# Patient Record
Sex: Male | Born: 1965
Health system: Southern US, Community
[De-identification: ages and names within clinical notes are randomized; demographics above are authoritative.]

## PROBLEM LIST (undated history)

## (undated) DIAGNOSIS — I2510 Atherosclerotic heart disease of native coronary artery without angina pectoris: Secondary | ICD-10-CM

## (undated) DIAGNOSIS — E119 Type 2 diabetes mellitus without complications: Secondary | ICD-10-CM

## (undated) DIAGNOSIS — R7989 Other specified abnormal findings of blood chemistry: Secondary | ICD-10-CM

## (undated) DIAGNOSIS — N182 Chronic kidney disease, stage 2 (mild): Secondary | ICD-10-CM

## (undated) DIAGNOSIS — G4733 Obstructive sleep apnea (adult) (pediatric): Secondary | ICD-10-CM

## (undated) DIAGNOSIS — G44009 Cluster headache syndrome, unspecified, not intractable: Secondary | ICD-10-CM

## (undated) DIAGNOSIS — M999 Biomechanical lesion, unspecified: Secondary | ICD-10-CM

## (undated) DIAGNOSIS — Z720 Tobacco use: Secondary | ICD-10-CM

## (undated) DIAGNOSIS — I10 Essential (primary) hypertension: Secondary | ICD-10-CM

## (undated) DIAGNOSIS — E785 Hyperlipidemia, unspecified: Secondary | ICD-10-CM

## (undated) DIAGNOSIS — M436 Torticollis: Secondary | ICD-10-CM

## (undated) DIAGNOSIS — I4891 Unspecified atrial fibrillation: Secondary | ICD-10-CM

## (undated) HISTORY — DX: Torticollis: M43.6

## (undated) HISTORY — DX: Other specified abnormal findings of blood chemistry: R79.89

## (undated) HISTORY — PX: TONSILLECTOMY: SUR1361

## (undated) HISTORY — PX: OTHER SURGICAL HISTORY: SHX169

## (undated) HISTORY — DX: Atherosclerotic heart disease of native coronary artery without angina pectoris: I25.10

## (undated) HISTORY — DX: Biomechanical lesion, unspecified: M99.9

---

## 2010-10-20 ENCOUNTER — Ambulatory Visit (HOSPITAL_COMMUNITY)
Admission: RE | Admit: 2010-10-20 | Discharge: 2010-10-20 | Disposition: A | Discharge status: Home / Self Care | Payer: Managed Care, Other (non HMO) | Source: Ambulatory Visit | Attending: Urology | Admitting: Urology

## 2010-10-20 ENCOUNTER — Ambulatory Visit (HOSPITAL_COMMUNITY): Payer: Managed Care, Other (non HMO)

## 2010-10-20 DIAGNOSIS — F17200 Nicotine dependence, unspecified, uncomplicated: Secondary | ICD-10-CM | POA: Insufficient documentation

## 2010-10-20 DIAGNOSIS — N200 Calculus of kidney: Secondary | ICD-10-CM | POA: Insufficient documentation

## 2010-10-20 LAB — CBC
MCH: 30.5 pg (ref 26.0–34.0)
MCHC: 34.6 g/dL (ref 30.0–36.0)
Platelets: 264 10*3/uL (ref 150–400)
RDW: 12.3 % (ref 11.5–15.5)

## 2010-10-20 LAB — PROTIME-INR
INR: 0.93 (ref 0.00–1.49)
Prothrombin Time: 12.7 seconds (ref 11.6–15.2)

## 2012-08-12 ENCOUNTER — Other Ambulatory Visit: Payer: Self-pay | Admitting: *Deleted

## 2012-08-16 ENCOUNTER — Ambulatory Visit (HOSPITAL_COMMUNITY)
Admission: RE | Admit: 2012-08-16 | Discharge: 2012-08-16 | Disposition: A | Discharge status: Home / Self Care | Payer: Managed Care, Other (non HMO) | Source: Ambulatory Visit | Attending: Cardiovascular Disease | Admitting: Cardiovascular Disease

## 2012-08-16 ENCOUNTER — Encounter (HOSPITAL_COMMUNITY)
Admission: RE | Disposition: A | Discharge status: Home / Self Care | Payer: Self-pay | Source: Ambulatory Visit | Attending: Cardiovascular Disease

## 2012-08-16 ENCOUNTER — Encounter (HOSPITAL_COMMUNITY): Payer: Self-pay | Admitting: Physician Assistant

## 2012-08-16 DIAGNOSIS — F17200 Nicotine dependence, unspecified, uncomplicated: Secondary | ICD-10-CM | POA: Insufficient documentation

## 2012-08-16 DIAGNOSIS — E119 Type 2 diabetes mellitus without complications: Secondary | ICD-10-CM | POA: Insufficient documentation

## 2012-08-16 DIAGNOSIS — Z720 Tobacco use: Secondary | ICD-10-CM | POA: Insufficient documentation

## 2012-08-16 DIAGNOSIS — R079 Chest pain, unspecified: Secondary | ICD-10-CM | POA: Insufficient documentation

## 2012-08-16 DIAGNOSIS — E1169 Type 2 diabetes mellitus with other specified complication: Secondary | ICD-10-CM | POA: Insufficient documentation

## 2012-08-16 DIAGNOSIS — I2510 Atherosclerotic heart disease of native coronary artery without angina pectoris: Secondary | ICD-10-CM | POA: Insufficient documentation

## 2012-08-16 DIAGNOSIS — E785 Hyperlipidemia, unspecified: Secondary | ICD-10-CM | POA: Insufficient documentation

## 2012-08-16 DIAGNOSIS — G4733 Obstructive sleep apnea (adult) (pediatric): Secondary | ICD-10-CM | POA: Insufficient documentation

## 2012-08-16 DIAGNOSIS — E7849 Other hyperlipidemia: Secondary | ICD-10-CM | POA: Insufficient documentation

## 2012-08-16 DIAGNOSIS — I129 Hypertensive chronic kidney disease with stage 1 through stage 4 chronic kidney disease, or unspecified chronic kidney disease: Secondary | ICD-10-CM | POA: Insufficient documentation

## 2012-08-16 DIAGNOSIS — N182 Chronic kidney disease, stage 2 (mild): Secondary | ICD-10-CM | POA: Insufficient documentation

## 2012-08-16 HISTORY — DX: Obstructive sleep apnea (adult) (pediatric): G47.33

## 2012-08-16 HISTORY — DX: Tobacco use: Z72.0

## 2012-08-16 HISTORY — DX: Essential (primary) hypertension: I10

## 2012-08-16 HISTORY — PX: LEFT HEART CATHETERIZATION WITH CORONARY ANGIOGRAM: SHX5451

## 2012-08-16 HISTORY — DX: Chronic kidney disease, stage 2 (mild): N18.2

## 2012-08-16 HISTORY — DX: Type 2 diabetes mellitus without complications: E11.9

## 2012-08-16 HISTORY — DX: Cluster headache syndrome, unspecified, not intractable: G44.009

## 2012-08-16 HISTORY — DX: Hyperlipidemia, unspecified: E78.5

## 2012-08-16 LAB — GLUCOSE, CAPILLARY: Glucose-Capillary: 84 mg/dL (ref 70–99)

## 2012-08-16 SURGERY — LEFT HEART CATHETERIZATION WITH CORONARY ANGIOGRAM
Anesthesia: LOCAL

## 2012-08-16 MED ORDER — ACETAMINOPHEN 325 MG PO TABS: 650.0000 mg | ORAL_TABLET | ORAL | Status: DC | PRN

## 2012-08-16 MED ORDER — ADENOSINE 12 MG/4ML IV SOLN
16.0000 mL | Freq: Once | INTRAVENOUS | Status: DC
  Filled 2012-08-16: qty 16

## 2012-08-16 MED ORDER — VERAPAMIL HCL 2.5 MG/ML IV SOLN
INTRAVENOUS | Status: AC
  Filled 2012-08-16: qty 2

## 2012-08-16 MED ORDER — MIDAZOLAM HCL 2 MG/2ML IJ SOLN
INTRAMUSCULAR | Status: AC
  Filled 2012-08-16: qty 2

## 2012-08-16 MED ORDER — FENTANYL CITRATE 0.05 MG/ML IJ SOLN
INTRAMUSCULAR | Status: AC
  Filled 2012-08-16: qty 2

## 2012-08-16 MED ORDER — SODIUM CHLORIDE 0.9 % IV SOLN
INTRAVENOUS | Status: DC
  Administered 2012-08-16: 13:00:00 via INTRAVENOUS

## 2012-08-16 MED ORDER — DIAZEPAM 5 MG PO TABS
ORAL_TABLET | ORAL | Status: AC
  Filled 2012-08-16: qty 1

## 2012-08-16 MED ORDER — HEPARIN (PORCINE) IN NACL 2-0.9 UNIT/ML-% IJ SOLN
INTRAMUSCULAR | Status: AC
  Filled 2012-08-16: qty 1000

## 2012-08-16 MED ORDER — NITROGLYCERIN 1 MG/10 ML FOR IR/CATH LAB
INTRA_ARTERIAL | Status: AC
  Filled 2012-08-16: qty 10

## 2012-08-16 MED ORDER — DIAZEPAM 5 MG PO TABS
5.0000 mg | ORAL_TABLET | ORAL | Status: AC
  Administered 2012-08-16: 5 mg via ORAL

## 2012-08-16 MED ORDER — MORPHINE SULFATE 2 MG/ML IJ SOLN: 2.0000 mg | INTRAMUSCULAR | Status: DC | PRN

## 2012-08-16 MED ORDER — SODIUM CHLORIDE 0.9 % IJ SOLN: 3.0000 mL | INTRAMUSCULAR | Status: DC | PRN

## 2012-08-16 MED ORDER — SODIUM CHLORIDE 0.9 % IV SOLN
INTRAVENOUS | Status: AC
  Administered 2012-08-16: 18:00:00 via INTRAVENOUS

## 2012-08-16 MED ORDER — BIVALIRUDIN 250 MG IV SOLR
INTRAVENOUS | Status: AC
  Filled 2012-08-16: qty 250

## 2012-08-16 MED ORDER — LIDOCAINE HCL (PF) 1 % IJ SOLN
INTRAMUSCULAR | Status: AC
  Filled 2012-08-16: qty 30

## 2012-08-16 MED ORDER — HEPARIN SODIUM (PORCINE) 1000 UNIT/ML IJ SOLN
INTRAMUSCULAR | Status: AC
  Filled 2012-08-16: qty 1

## 2012-08-16 MED ORDER — ONDANSETRON HCL 4 MG/2ML IJ SOLN: 4.0000 mg | Freq: Four times a day (QID) | INTRAMUSCULAR | Status: DC | PRN

## 2012-08-16 NOTE — H&P (Signed)
    Pt was reexamined and existing H & P reviewed. No changes found.  Runell Gess, MD [facility] 08/16/2012 3:40 PM

## 2012-08-16 NOTE — H&P (Signed)
Matthew Horton is an 47 y.o. male.   Chief Complaint:  Abnormal Stress test HPI:   The patient is a 47 yo male, who drives a truck for a living, with a history of DM2, continued tobacco abuse, HTN, HLD, OSA, CKD.  He has been having trouble with chest tightness and had a stress test which was abnormal.  He has never had a sleep study and is not interested in having one because it might affect his job.  He has lost about 15 ponds in the last two weeks due to dietary changes.  He denies N, V, fever, cough, congestion, CP, SOB beyond his usual baseline, orthopnea, LEE, Abd pain, hematuria, hematochezia.  Medications:  ASA 81mg , NTG SL 0.4mg , Lopressor 25mg  BID  Past Medical History  Diagnosis Date  . HTN (hypertension)   . DM2 (diabetes mellitus, type 2)   . OSA (obstructive sleep apnea)   . CKD (chronic kidney disease) stage 2, GFR 60-89 ml/min   . HLD (hyperlipidemia)   . Tobacco abuse   . Cluster headache     No past surgical history on file.  No family history on file. Social History:  reports that he has been smoking Cigarettes.  He has a 30 pack-year smoking history. He does not have any smokeless tobacco history on file. He reports that he does not drink alcohol. His drug history is not on file.  Allergies: No Known Allergies  Medications Prior to Admission  Medication Sig Dispense Refill  . aspirin EC 81 MG tablet Take 81 mg by mouth daily.      . metFORMIN (GLUCOPHAGE) 500 MG tablet Take 500 mg by mouth daily.      . metoprolol (LOPRESSOR) 50 MG tablet Take 50 mg by mouth 2 (two) times daily.      . simvastatin (ZOCOR) 20 MG tablet Take 20 mg by mouth every evening.        Results for orders placed during the hospital encounter of 08/16/12 (from the past 48 hour(s))  GLUCOSE, CAPILLARY     Status: None   Collection Time    08/16/12  1:25 PM      Result Value Range   Glucose-Capillary 75  70 - 99 mg/dL   No results found.  Review of Systems  Constitutional: Negative  for fever and diaphoresis.  HENT: Negative for sore throat.   Respiratory: Negative for cough, shortness of breath and wheezing.   Cardiovascular: Negative for chest pain, orthopnea, leg swelling and PND.  Gastrointestinal: Negative for nausea, vomiting, abdominal pain, diarrhea, constipation, blood in stool and melena.  Genitourinary: Negative for dysuria and hematuria.  Musculoskeletal: Negative for myalgias.  Neurological: Negative for dizziness.    Blood pressure 141/89, pulse 66, temperature 97 F (36.1 C), temperature source Oral, resp. rate 18, height 5\' 10"  (1.778 m), weight 97.523 kg (215 lb), SpO2 100.00%. Physical Exam  Constitutional: He is oriented to person, place, and time. He appears well-developed and well-nourished. No distress.  HENT:  Head: Normocephalic and atraumatic.  Eyes: EOM are normal. Pupils are equal, round, and reactive to light. No scleral icterus.  Neck: Normal range of motion. Neck supple. No JVD present.  Cardiovascular: Normal rate, regular rhythm, S1 normal and S2 normal.   No murmur heard. Pulses:      Radial pulses are 2+ on the right side, and 2+ on the left side.       Dorsalis pedis pulses are 2+ on the right side, and 2+ on  the left side.  No Carotid Bruit.   Respiratory: Effort normal and breath sounds normal. He has no wheezes. He has no rales.  GI: Soft. He exhibits no distension. There is no tenderness.  Musculoskeletal: He exhibits no edema.  Lymphadenopathy:    He has no cervical adenopathy.  Neurological: He is alert and oriented to person, place, and time. He exhibits normal muscle tone.  Skin: Skin is warm and dry.  Psychiatric: He has a normal mood and affect.     Assessment/Plan Patient Active Problem List  Diagnosis  . Tobacco abuse  . HTN (hypertension)  . DM2 (diabetes mellitus, type 2)  . HLD (hyperlipidemia)  . Abnormal stress test  . CKD (chronic kidney disease) stage 2, GFR 60-89 ml/min   Plan:  Left heart cath  with possible PCI with Dr. Allyson Sabal.  HAGER, BRYAN 08/16/2012, 2:24 PM    Agree with note written by Jones Skene PAC  + CRF, CP and + MPI for cardiac cath radially.  Runell Gess 08/16/2012 3:39 PM

## 2012-08-16 NOTE — CV Procedure (Signed)
Matthew Horton is a 47 y.o. male    [id number] LOCATION:  FACILITY: [facility]  PHYSICIAN: Nanetta Batty, M.D. 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 He has been having trouble with chest tightness and had a stress test which was abnormal. He has never had a sleep study and is not interested in having one because it might affect his job. He has lost about 15 ponds in the last two weeks due to dietary changes. He denies N, V, fever, cough, congestion, CP, SOB beyond his usual baseline, orthopnea, LEE, Abd pain, hematuria, hematochezia.    PROCEDURE DESCRIPTION:    The patient was brought to the second floor [place] [facility] cath lab in the postabsorptive state. He was  premedicated with Valium 5 mg by mouth, IV Versed and fentanyl.Marland Kitchen His right wristwas prepped and shaved in usual sterile fashion. Xylocaine 1% was used for local anesthesia. A I French sheath was inserted into the right radial artery using standard Seldinger technique. The patient received 5000 units  of heparin  intravenously.  A 5 [place] TIG catheter and 5 [place] JR 4 catheter along with a pigtail catheter were used for selective coronary angiography left ventriculography respectively. The patient does used for the entirety of the case. Retrograde aortic, left ventricular and pullback pressures were recorded.    HEMODYNAMICS:    AO SYSTOLIC/AO DIASTOLIC: 135/85   LV SYSTOLIC/LV DIASTOLIC: 131/18  ANGIOGRAPHIC RESULTS:   1. Left main; normal  2. LAD; 30-40% segmental proximal just distal to the takeoff of a moderately large diagonal branch. The diagonal branch had a 70% smooth proximal stenosis. 3. Left circumflex; codominant and free of significant disease. The  first marginal branch was small and had a 50% ostial stenosis.Marland Kitchen  4. Right coronary artery; codominant with a 70% smooth stenosis in the midportion. 5. Left ventriculography; RAO left ventriculogram was performed using  25 mL of Visipaque dye at 12 mL/second. The overall LVEF estimated  60 % Without wall motion abnormalities  IMPRESSION:Mr. Durkee has moderate two-vessel disease in a diagonal branch and RCA. We will proceed with FFR interrogation to determine physiologic split significance.  Procedure description: The 5 French sheath was upgraded to a 6 [place] sheath. The patient received Angiomax bolus and ACT documented 562. Total of 135 cc was administered to the patient. Using a 6 [place] JR 4 guide catheter FFR was obtained of the mid RCA of 0.86 suggesting this was not a physiologically significant lesion. Using a 6 [place] XB LAD 3.5 guide catheter FFR was obtained on the diagonal branch at 0.91 likewise suggesting this was not physiologically significant.  Final impression: Moderate two-vessel disease with negative FFR determination in both vessels suggesting each was not physiologically significant. Continue medical therapy will be recommended. The guidewire and catheter was removed. A TR band was placed on the right wrist to achieve patent hemostasis. The patient left the Cath Lab in stable condition. He'll be gently hydrated for 2 hours and then discharged home. Arnette Felts [facility] was notified of these results.    Runell Gess MD, [facility] 08/16/2012 5:12 PM

## 2012-08-17 MED FILL — Dextrose Inj 5%: INTRAVENOUS | Qty: 50 | Status: AC

## 2013-03-21 ENCOUNTER — Inpatient Hospital Stay (HOSPITAL_COMMUNITY)
Admission: EM | Admit: 2013-03-21 | Discharge: 2013-03-23 | DRG: 247 | Disposition: A | Discharge status: Home / Self Care | Payer: Managed Care, Other (non HMO) | Attending: Internal Medicine | Admitting: Internal Medicine

## 2013-03-21 ENCOUNTER — Emergency Department (HOSPITAL_COMMUNITY): Payer: Managed Care, Other (non HMO)

## 2013-03-21 ENCOUNTER — Encounter (HOSPITAL_COMMUNITY): Payer: Self-pay | Admitting: Nurse Practitioner

## 2013-03-21 DIAGNOSIS — R9439 Abnormal result of other cardiovascular function study: Secondary | ICD-10-CM

## 2013-03-21 DIAGNOSIS — I2510 Atherosclerotic heart disease of native coronary artery without angina pectoris: Secondary | ICD-10-CM | POA: Insufficient documentation

## 2013-03-21 DIAGNOSIS — E785 Hyperlipidemia, unspecified: Secondary | ICD-10-CM | POA: Diagnosis present

## 2013-03-21 DIAGNOSIS — F17200 Nicotine dependence, unspecified, uncomplicated: Secondary | ICD-10-CM | POA: Diagnosis present

## 2013-03-21 DIAGNOSIS — R079 Chest pain, unspecified: Secondary | ICD-10-CM | POA: Diagnosis present

## 2013-03-21 DIAGNOSIS — I10 Essential (primary) hypertension: Secondary | ICD-10-CM

## 2013-03-21 DIAGNOSIS — E119 Type 2 diabetes mellitus without complications: Secondary | ICD-10-CM | POA: Diagnosis present

## 2013-03-21 DIAGNOSIS — I200 Unstable angina: Secondary | ICD-10-CM | POA: Diagnosis present

## 2013-03-21 DIAGNOSIS — Z955 Presence of coronary angioplasty implant and graft: Secondary | ICD-10-CM

## 2013-03-21 DIAGNOSIS — I209 Angina pectoris, unspecified: Secondary | ICD-10-CM | POA: Diagnosis present

## 2013-03-21 DIAGNOSIS — G44009 Cluster headache syndrome, unspecified, not intractable: Secondary | ICD-10-CM | POA: Diagnosis present

## 2013-03-21 DIAGNOSIS — I129 Hypertensive chronic kidney disease with stage 1 through stage 4 chronic kidney disease, or unspecified chronic kidney disease: Secondary | ICD-10-CM | POA: Diagnosis present

## 2013-03-21 DIAGNOSIS — N182 Chronic kidney disease, stage 2 (mild): Secondary | ICD-10-CM | POA: Diagnosis present

## 2013-03-21 DIAGNOSIS — Z720 Tobacco use: Secondary | ICD-10-CM

## 2013-03-21 DIAGNOSIS — Z79899 Other long term (current) drug therapy: Secondary | ICD-10-CM

## 2013-03-21 DIAGNOSIS — Z7982 Long term (current) use of aspirin: Secondary | ICD-10-CM

## 2013-03-21 DIAGNOSIS — G4733 Obstructive sleep apnea (adult) (pediatric): Secondary | ICD-10-CM | POA: Diagnosis present

## 2013-03-21 DIAGNOSIS — I4891 Unspecified atrial fibrillation: Secondary | ICD-10-CM | POA: Diagnosis not present

## 2013-03-21 LAB — GLUCOSE, CAPILLARY: Glucose-Capillary: 89 mg/dL (ref 70–99)

## 2013-03-21 LAB — POCT I-STAT TROPONIN I
Troponin i, poc: 0 ng/mL (ref 0.00–0.08)
Troponin i, poc: 0 ng/mL (ref 0.00–0.08)

## 2013-03-21 LAB — PRO B NATRIURETIC PEPTIDE: Pro B Natriuretic peptide (BNP): 32.5 pg/mL (ref 0–125)

## 2013-03-21 LAB — MRSA PCR SCREENING: MRSA by PCR: NEGATIVE

## 2013-03-21 LAB — CBC
HCT: 40.8 % (ref 39.0–52.0)
Hemoglobin: 13.8 g/dL (ref 13.0–17.0)
MCH: 29.9 pg (ref 26.0–34.0)
MCHC: 33.8 g/dL (ref 30.0–36.0)
MCV: 88.3 fL (ref 78.0–100.0)

## 2013-03-21 LAB — BASIC METABOLIC PANEL
BUN: 13 mg/dL (ref 6–23)
GFR calc non Af Amer: 86 mL/min — ABNORMAL LOW (ref >90)
Glucose, Bld: 155 mg/dL — ABNORMAL HIGH (ref 70–99)
Potassium: 3.8 mEq/L (ref 3.5–5.1)

## 2013-03-21 LAB — TROPONIN I
Troponin I: <0.3 ng/mL (ref <0.30)
Troponin I: <0.3 ng/mL (ref <0.30)

## 2013-03-21 LAB — HEPARIN LEVEL (UNFRACTIONATED): Heparin Unfractionated: 0.27 IU/mL — ABNORMAL LOW (ref 0.30–0.70)

## 2013-03-21 MED ORDER — ASPIRIN 81 MG PO CHEW
324.0000 mg | CHEWABLE_TABLET | Freq: Once | ORAL | Status: AC
  Administered 2013-03-21: 324 mg via ORAL
  Filled 2013-03-21: qty 4

## 2013-03-21 MED ORDER — SODIUM CHLORIDE 0.9 % IV SOLN
INTRAVENOUS | Status: DC
  Administered 2013-03-21 – 2013-03-22 (×2): via INTRAVENOUS

## 2013-03-21 MED ORDER — ONDANSETRON HCL 4 MG/2ML IJ SOLN: 4.0000 mg | Freq: Four times a day (QID) | INTRAMUSCULAR | Status: DC | PRN

## 2013-03-21 MED ORDER — METOPROLOL TARTRATE 50 MG PO TABS
50.0000 mg | ORAL_TABLET | Freq: Two times a day (BID) | ORAL | Status: DC
  Administered 2013-03-21 – 2013-03-23 (×4): 50 mg via ORAL
  Filled 2013-03-21 (×5): qty 1

## 2013-03-21 MED ORDER — NITROGLYCERIN 2 % TD OINT
1.0000 [in_us] | TOPICAL_OINTMENT | Freq: Once | TRANSDERMAL | Status: AC
  Administered 2013-03-21: 1 [in_us] via TOPICAL
  Filled 2013-03-21: qty 1

## 2013-03-21 MED ORDER — SODIUM CHLORIDE 0.9 % IJ SOLN
3.0000 mL | Freq: Two times a day (BID) | INTRAMUSCULAR | Status: DC
  Administered 2013-03-21: 3 mL via INTRAVENOUS
  Administered 2013-03-22: 08:00:00 via INTRAVENOUS

## 2013-03-21 MED ORDER — ACETAMINOPHEN 325 MG PO TABS
650.0000 mg | ORAL_TABLET | ORAL | Status: DC | PRN
  Administered 2013-03-21 – 2013-03-22 (×5): 650 mg via ORAL
  Filled 2013-03-21 (×5): qty 2

## 2013-03-21 MED ORDER — SODIUM CHLORIDE 0.9 % IJ SOLN: 3.0000 mL | INTRAMUSCULAR | Status: DC | PRN

## 2013-03-21 MED ORDER — ZOLPIDEM TARTRATE 5 MG PO TABS: 5.0000 mg | ORAL_TABLET | Freq: Every evening | ORAL | Status: DC | PRN

## 2013-03-21 MED ORDER — HEPARIN BOLUS VIA INFUSION
4000.0000 [IU] | Freq: Once | INTRAVENOUS | Status: AC
  Administered 2013-03-21: 4000 [IU] via INTRAVENOUS
  Filled 2013-03-21: qty 4000

## 2013-03-21 MED ORDER — HEPARIN (PORCINE) IN NACL 100-0.45 UNIT/ML-% IJ SOLN
1350.0000 [IU]/h | INTRAMUSCULAR | Status: DC
  Administered 2013-03-21: 1150 [IU]/h via INTRAVENOUS
  Filled 2013-03-21 (×3): qty 250

## 2013-03-21 MED ORDER — SODIUM CHLORIDE 0.9 % IV SOLN
1000.0000 mL | INTRAVENOUS | Status: DC
  Administered 2013-03-21: 1000 mL via INTRAVENOUS

## 2013-03-21 MED ORDER — ALPRAZOLAM 0.25 MG PO TABS
0.2500 mg | ORAL_TABLET | Freq: Two times a day (BID) | ORAL | Status: DC | PRN
  Administered 2013-03-21 – 2013-03-22 (×2): 0.25 mg via ORAL
  Filled 2013-03-21 (×2): qty 1

## 2013-03-21 MED ORDER — NITROGLYCERIN 0.4 MG SL SUBL: 0.4000 mg | SUBLINGUAL_TABLET | SUBLINGUAL | Status: DC | PRN

## 2013-03-21 MED ORDER — ASPIRIN EC 81 MG PO TBEC
81.0000 mg | DELAYED_RELEASE_TABLET | Freq: Every morning | ORAL | Status: DC
  Administered 2013-03-23: 10:00:00 81 mg via ORAL
  Filled 2013-03-21 (×2): qty 1

## 2013-03-21 MED ORDER — SIMVASTATIN 20 MG PO TABS
20.0000 mg | ORAL_TABLET | Freq: Every evening | ORAL | Status: DC
  Administered 2013-03-21 – 2013-03-22 (×2): 20 mg via ORAL
  Filled 2013-03-21 (×3): qty 1

## 2013-03-21 MED ORDER — SODIUM CHLORIDE 0.9 % IV SOLN
250.0000 mL | INTRAVENOUS | Status: DC | PRN
  Administered 2013-03-21: 18:00:00 via INTRAVENOUS

## 2013-03-21 NOTE — ED Notes (Signed)
Attempted IV x2. Unable to obtain access.  

## 2013-03-21 NOTE — Progress Notes (Signed)
ANTICOAGULATION CONSULT NOTE - Initial Consult  Pharmacy Consult for heparin Indication: chest pain/ACS  No Known Allergies  Patient Measurements: Height: 5\' 10"  (177.8 cm) Weight: 220 lb 12.8 oz (100.154 kg) IBW/kg (Calculated) : 73 Heparin Dosing Weight: 82kg  Vital Signs: Temp: 98.3 F (36.8 C) (10/07 1150) Temp src: Oral (10/07 1150) BP: 134/85 mmHg (10/07 1500) Pulse Rate: 73 (10/07 1500)  Labs:  Recent Labs  03/21/13 1230  HGB 13.8  HCT 40.8  PLT 237  APTT 26  LABPROT 13.0  INR 1.00  CREATININE 1.02    Estimated Creatinine Clearance: 106.2 ml/min (by C-G formula based on Cr of 1.02).   Medical History: Past Medical History  Diagnosis Date  . HTN (hypertension)   . DM2 (diabetes mellitus, type 2)   . CKD (chronic kidney disease) stage 2, GFR 60-89 ml/min   . HLD (hyperlipidemia)   . Tobacco abuse   . Cluster headache     Assessment: 47 year old male with known CAD present to [facility] with recurrent chest pain. Patient had a previous cath earlier this year revealing 2 vessel cad. Orders received to start IV heparin and  Plan on cath in am. Baseline cbc/coags within normal limits.  Goal of Therapy:  Heparin level 0.3-0.7 units/ml Monitor platelets by anticoagulation protocol: Yes   Plan:  Give 4000 units bolus x 1 Start heparin infusion at 1150 units/hr Check anti-Xa level in 6 hours and daily while on heparin Continue to monitor H&H and platelets  Sheppard Coil PharmD., BCPS Clinical Pharmacist Pager [phone] 03/21/2013 4:18 PM

## 2013-03-21 NOTE — ED Notes (Signed)
Pt reports chronic CP but today it was worse than normal after he woke up. CP persisted and then when he got to work he began to feel "like my legs werent there, i felt like I couldn't walk" and felt dizzy. at that time he decided to come to er for eval. Pt took 1 SL nitro after CP started "which helped ease the pain but now its coming back." wife states pt has seened more SOB recenlty

## 2013-03-21 NOTE — H&P (Signed)
CC: Chest Pain Referring Physician: ER MD Primary Cardiologist: Dr. 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 He underwent a diagnostic LHC, by Dr. Allyson Sabal, on 08/16/12 in the setting of chest pain and an abnormal NST. The cath revealed moderate two-vessel disease in a diagonal branch and RCA. The first marginal branch was small and had a 50% ostial stenosis. The  right coronary artery was codominant with a 70% smooth stenosis in the midportion. The overall LVEF was estimated at 60 % without wall motion abnormalities. Negative FFR determination in both vessels suggested each was not physiologically significant. Medical therapy was recommended. He was sent home the very same day on ASA, Lopressor and Simvastatin.    He returned to the [facility] ER today with a complaint of substernal chest pain. The pain first began at rest around 1:30 am. It is a pressure like pain. It feels like a "heavy brick" is sitting on his chest. He denies radiation. He has had associated SOB fatigue/genralized malaise. No n/v or diaphoresis. He has felt a bit lightheaded. He took a SL NTG earlier this am and the pain improved a moderate amount, but then returned. He is currently CP free in the ER with NTG patch.   The patient states that he has continued to smoke 1 1/2 ppd. He reports compliance with his prescribed home mediations. He endorses a strong family history of CAD. His father had an MI in his early 53's and is s/p CABG.   Past Medical History  Diagnosis Date  . HTN (hypertension)   . DM2 (diabetes mellitus, type 2)   . CKD (chronic kidney disease) stage 2, GFR 60-89 ml/min   . HLD (hyperlipidemia)   . Tobacco abuse   . Cluster headache     History reviewed. No pertinent past surgical history.  Family History  Problem Relation Age of Onset  . Coronary artery disease Father 66      MI and s/p CABG    Social History:  reports that he has been smoking Cigarettes.  He has a 30 pack-year smoking history. He does not have any smokeless tobacco history on file. He reports that he does not drink alcohol or use illicit drugs.  Allergies: No Known Allergies  Medications:  Prior to Admission medications   Medication Sig Start Date End Date Taking? Authorizing Provider  aspirin EC 81 MG tablet Take 81 mg by mouth every morning.    Yes Historical Provider, MD  metFORMIN (GLUCOPHAGE) 500 MG tablet Take 500 mg by mouth every evening.    Yes Historical Provider, MD  metoprolol (LOPRESSOR) 50 MG tablet Take 50 mg by mouth 2 (two) times daily.   Yes Historical Provider, MD  simvastatin (ZOCOR) 20 MG tablet Take 20 mg by mouth every evening.   Yes Historical Provider, MD     Results for orders placed during the hospital encounter of 03/21/13 (from the past 48 hour(s))  BASIC METABOLIC PANEL     Status: Abnormal   Collection Time    03/21/13 12:30 PM      Result Value Range   Sodium 138  135 - 145 mEq/L   Potassium 3.8  3.5 - 5.1 mEq/L   Chloride 100  96 - 112 mEq/L   CO2 27  19 - 32 mEq/L   Glucose, Bld 155 (*)  70 - 99 mg/dL   BUN 13  6 - 23 mg/dL   Creatinine, Ser 1.61  0.50 - 1.35 mg/dL   Calcium 9.3  8.4 - 09.6 mg/dL   GFR calc non Af Amer 86 (*) >90 mL/min   GFR calc Af Amer >90  >90 mL/min   Comment: (NOTE)     The eGFR has been calculated using the CKD EPI equation.     This calculation has not been validated in all clinical situations.     eGFR's persistently <90 mL/min signify possible Chronic Kidney     Disease.  CBC     Status: None   Collection Time    03/21/13 12:30 PM      Result Value Range   WBC 6.8  4.0 - 10.5 K/uL   RBC 4.62  4.22 - 5.81 MIL/uL   Hemoglobin 13.8  13.0 - 17.0 g/dL   HCT 04.5  40.9 - 81.1 %   MCV 88.3  78.0 - 100.0 fL   MCH 29.9  26.0 - 34.0 pg   MCHC 33.8  30.0 - 36.0 g/dL   RDW 91.4  78.2 - 95.6 %   Platelets 237  150 - 400  K/uL  PRO B NATRIURETIC PEPTIDE     Status: None   Collection Time    03/21/13 12:30 PM      Result Value Range   Pro B Natriuretic peptide (BNP) 32.5  0 - 125 pg/mL  PROTIME-INR     Status: None   Collection Time    03/21/13 12:30 PM      Result Value Range   Prothrombin Time 13.0  11.6 - 15.2 seconds   INR 1.00  0.00 - 1.49  APTT     Status: None   Collection Time    03/21/13 12:30 PM      Result Value Range   aPTT 26  24 - 37 seconds  POCT I-STAT TROPONIN I     Status: None   Collection Time    03/21/13 12:40 PM      Result Value Range   Troponin i, poc 0.00  0.00 - 0.08 ng/mL   Comment 3            Comment: Due to the release kinetics of cTnI,     a negative result within the first hours     of the onset of symptoms does not rule out     myocardial infarction with certainty.     If myocardial infarction is still suspected,     repeat the test at appropriate intervals.    Dg Chest 2 View  03/21/2013   CLINICAL DATA:  Chest pain, dizziness, shortness of breath  EXAM: CHEST  2 VIEW  COMPARISON:  None.  FINDINGS: The heart size and mediastinal contours are within normal limits. No acute infiltrate or pulmonary edema. Mild degenerative changes lower thoracic spine.  IMPRESSION: No active cardiopulmonary disease.   Electronically Signed   By: Natasha Mead M.D.   On: 03/21/2013 12:13    Review of Systems  Constitutional: Positive for malaise/fatigue. Negative for diaphoresis.  Respiratory: Positive for shortness of breath.   Cardiovascular: Positive for chest pain. Negative for leg swelling.  Gastrointestinal: Negative for nausea and vomiting.  Neurological: Negative for dizziness and loss of consciousness.  All other systems reviewed and are negative.   Blood pressure 139/88, pulse 72, temperature 98.3 F (36.8 C), temperature source Oral, resp. rate 17, height 5\' 10"  (1.778  m), weight 220 lb 12.8 oz (100.154 kg), SpO2 98.00%. Physical Exam  Constitutional: He is oriented  to person, place, and time. He appears well-developed and well-nourished. No distress.  Neck: No JVD present. Carotid bruit is not present.  Cardiovascular: Normal rate, regular rhythm, normal heart sounds and intact distal pulses.  Exam reveals no gallop and no friction rub.   No murmur heard. Pulses:      Radial pulses are 2+ on the right side, and 2+ on the left side.       Dorsalis pedis pulses are 2+ on the right side, and 2+ on the left side.  Respiratory: Effort normal and breath sounds normal. No respiratory distress. He has no wheezes. He has no rales.  GI: Soft. Bowel sounds are normal. He exhibits no distension and no mass. There is no tenderness.  Musculoskeletal: He exhibits no edema.  Neurological: He is alert and oriented to person, place, and time.  Skin: Skin is warm and dry. He is not diaphoretic.  Psychiatric: He has a normal mood and affect. His behavior is normal.    Assessment/Plan: Principal Problem:   Unstable angina Active Problems:   Tobacco abuse   HTN (hypertension)   DM2 (diabetes mellitus, type 2)   HLD (hyperlipidemia)   CKD (chronic kidney disease) stage 2, GFR 60-89 ml/min   CAD (coronary artery disease)  Plan: This is a 47 y/o male with known CAD and multiple cardiac risk factors, including continued tobacco abuse (1.5 ppd), strong family history, HTN, DM and HLD, who presents with symptoms concerning for unstable angina. He was cathed by Dr Allyson Sabal 7 months ago.  There was moderate two-vessel disease with negative FFR determination in both vessels (left circumflex and RCA), suggesting each was not physiologically significant. The overall LVEF was estimated at 60 % without wall motion abnormalities. Continued medical therapy was recommended. He has been on ASA, Lopressor and a statin. He is currently CP free with nitro patch. His EKG shows no acute changes. POC troponin is negative and CXR is unremarkable, as is CBC and BMP. Given his symptoms, known CAD and  multiple cardiac risk factors, it is best to admit for observation and to rule out MI.  Will admit and will cycle troponins x 3. He will also benefit from re-look cath to look for progression of disease given his continued tobacco abuse. Will make NPO at midnight and will place precath orders. Will hold Metformin in anticipation of cath.  HR and BP both stable. Continue on ASA, BB and statin. Will initiate IV heparin per pharmacy. Will continue to stress the importance of smoking cessation.   MD to follow.    Allayne Butcher, PA-C 03/21/2013, 3:25 PM

## 2013-03-21 NOTE — ED Provider Notes (Signed)
CSN: [id number]     Arrival date & time 03/21/13  1139 History  First MD Initiated Contact with Patient 03/21/13 1219     Chief Complaint  Patient presents with  . Chest Pain   Patient is a 47 y.o. male presenting with chest pain.  Chest Pain Pain location:  Substernal area Pain quality comment:  Heaviness Pain radiates to:  Upper back Associated symptoms: no abdominal pain and no cough    Pt felt pain in his chest when he woke up this morning at 0130.  He does have history of heart disease but he  to work.  While there he started feeling weak all over and lightheaded.  He took a NTG which seemed to help the symptoms.  He feels like it might be coming back a little.  Pt does continue to smoke. Past Medical History  Diagnosis Date  . HTN (hypertension)   . DM2 (diabetes mellitus, type 2)   . CKD (chronic kidney disease) stage 2, GFR 60-89 ml/min   . HLD (hyperlipidemia)   . Tobacco abuse   . Cluster headache    History reviewed. No pertinent past surgical history. History reviewed. No pertinent family history. History  Substance Use Topics  . Smoking status: Current Every Day Smoker -- 1.00 packs/day for 30 years    Types: Cigarettes  . Smokeless tobacco: Not on file  . Alcohol Use: No    Review of Systems  Respiratory: Negative for cough.   Cardiovascular: Positive for chest pain.  Gastrointestinal: Negative for abdominal pain.  All other systems reviewed and are negative.    Allergies  Review of patient's allergies indicates no known allergies.  Home Medications   Current Outpatient Rx  Name  Route  Sig  Dispense  Refill  . aspirin EC 81 MG tablet   Oral   Take 81 mg by mouth every morning.          . metFORMIN (GLUCOPHAGE) 500 MG tablet   Oral   Take 500 mg by mouth every evening.          . metoprolol (LOPRESSOR) 50 MG tablet   Oral   Take 50 mg by mouth 2 (two) times daily.         . simvastatin (ZOCOR) 20 MG tablet   Oral   Take 20 mg by mouth  every evening.          BP 139/88  Pulse 72  Temp(Src) 98.3 F (36.8 C) (Oral)  Resp 17  Ht 5\' 10"  (1.778 m)  Wt 220 lb 12.8 oz (100.154 kg)  BMI 31.68 kg/m2  SpO2 98% Physical Exam  Nursing note and vitals reviewed. Constitutional: He appears well-developed and well-nourished. No distress.  HENT:  Head: Normocephalic and atraumatic.  Right Ear: External ear normal.  Left Ear: External ear normal.  Eyes: Conjunctivae are normal. Right eye exhibits no discharge. Left eye exhibits no discharge. No scleral icterus.  Neck: Neck supple. No tracheal deviation present.  Cardiovascular: Normal rate, regular rhythm and intact distal pulses.   Pulmonary/Chest: Effort normal and breath sounds normal. No stridor. No respiratory distress. He has no wheezes. He has no rales.  Abdominal: Soft. Bowel sounds are normal. He exhibits no distension. There is no tenderness. There is no rebound and no guarding.  Musculoskeletal: He exhibits no edema and no tenderness.  Neurological: He is alert. He has normal strength. No sensory deficit. Cranial nerve deficit:  no gross defecits noted. He exhibits normal muscle  tone. He displays no seizure activity. Coordination normal.  Skin: Skin is warm and dry. No rash noted.  Psychiatric: He has a normal mood and affect.    ED Course  Procedures (including critical care time) EKG Rate 75 Normal sinus rhythm Incomplete right bundle branch block Borderline ECG  Labs Review Labs Reviewed  BASIC METABOLIC PANEL - Abnormal; Notable for the following:    Glucose, Bld 155 (*)    GFR calc non Af Amer 86 (*)    All other components within normal limits  CBC  PRO B NATRIURETIC PEPTIDE  PROTIME-INR  APTT  POCT I-STAT TROPONIN I   Imaging Review Dg Chest 2 View  03/21/2013   CLINICAL DATA:  Chest pain, dizziness, shortness of breath  EXAM: CHEST  2 VIEW  COMPARISON:  None.  FINDINGS: The heart size and mediastinal contours are within normal limits. No acute  infiltrate or pulmonary edema. Mild degenerative changes lower thoracic spine.  IMPRESSION: No active cardiopulmonary disease.   Electronically Signed   By: Natasha Mead M.D.   On: 03/21/2013 12:13   CHart Reviewed Cath report from March 2014 ANGIOGRAPHIC RESULTS:  1. Left main; normal  2. LAD; 30-40% segmental proximal just distal to the takeoff of a moderately large diagonal branch. The diagonal branch had a 70% smooth proximal stenosis.  3. Left circumflex; codominant and free of significant disease. The first marginal branch was small and had a 50% ostial stenosis.Marland Kitchen  4. Right coronary artery; codominant with a 70% smooth stenosis in the midportion.  5. Left ventriculography; RAO left ventriculogram was performed using  25 mL of Visipaque dye at 12 mL/second. The overall LVEF estimated  60 % Without wall motion abnormalities Medications  0.9 %  sodium chloride infusion (1,000 mLs Intravenous New Bag/Given 03/21/13 1248)  nitroGLYCERIN (NITROSTAT) SL tablet 0.4 mg (not administered)  aspirin chewable tablet 324 mg (324 mg Oral Given 03/21/13 1248)  nitroGLYCERIN (NITROGLYN) 2 % ointment 1 inch (1 inch Topical Given 03/21/13 1248)   1437 Pt is pain free  MDM   1. Chest pain   2. Coronary artery disease    Patient has known coronary artery disease. He presents with worsening symptoms. Patient unfortunately continues to smoke cigarettes. I'm concerned about unstable angina. After treatment in the emergency room he is pain-free. I will consult the cardiology service.    Celene Kras, MD 03/21/13 [phone]

## 2013-03-21 NOTE — Progress Notes (Signed)
ANTICOAGULATION CONSULT NOTE Pharmacy Consult for heparin Indication: chest pain/ACS  No Known Allergies  Patient Measurements: Height: 5\' 10"  (177.8 cm) Weight: 216 lb 14.9 oz (98.4 kg) IBW/kg (Calculated) : 73 Heparin Dosing Weight: 82kg  Vital Signs: Temp: 98.2 F (36.8 C) (10/07 2311) Temp src: Oral (10/07 2311) BP: 109/53 mmHg (10/07 2311) Pulse Rate: 82 (10/07 2147)  Labs:  Recent Labs  03/21/13 1230 03/21/13 1820 03/21/13 2240  HGB 13.8  --   --   HCT 40.8  --   --   PLT 237  --   --   APTT 26  --   --   LABPROT 13.0  --   --   INR 1.00  --   --   HEPARINUNFRC  --   --  0.27*  CREATININE 1.02  --   --   TROPONINI  --  <0.30  --     Estimated Creatinine Clearance: 105.4 ml/min (by C-G formula based on Cr of 1.02).  Assessment: 47 y.o. male with chest pain for heparin   Goal of Therapy:  Heparin level 0.3-0.7 units/ml Monitor platelets by anticoagulation protocol: Yes   Plan:  Increase Heparin 1350 units/hr Follow-up am labs.  Geannie Risen, PharmD, BCPS  03/21/2013 11:47 PM

## 2013-03-21 NOTE — H&P (Addendum)
Pt. Seen and examined. Agree with the NP/PA-C note as written. (The remainder of a 10-point ROS was negative except as above and stated in the HPI). 47 yo male with multiple cardiac risk factors including continued smoking, DM2, HPL, HTN, and OSA and father with CABG and MI at age 36.  He was referred for [facility] in 08/2012 after an apparently "abnormal" stress test (unclear what type). He had a LHC which demonstrated 2 "borderline" significant lesions in the mid RCA and 50% ostial OM1.  Both were non-significant by FFR and his medical therapy was adjusted. He reports that his symptoms really haven't gotten any better since his cath.  Earlier this morning he was awoken by "brick-like" chest pain which smoldered for several hours and eventually he took nitroglycerin - the pain relieved 10-15 minutes thereafter and he decided to come to the ER.    Impression: 1. Unstable angina 2. Known moderate to severe CAD which were FFR negative in 08/2012 3. Multiple ongoing cardiac risk factors  Plan: 1. Admit to CCU stepdown. Heparin and nitroglycerin gtts. 2. I would recommend repeat LHC +/- repeat FFR based on angiographic evaluation of his coronaries versus direct PCI if there is a significant lesion.; 3. Continued aggressive risk factor modification. 4. He has vowed to quit smoking.  We will need to help him with that.  Chrystie Nose, MD, [facility] Attending Cardiologist [facility]

## 2013-03-22 ENCOUNTER — Encounter (HOSPITAL_COMMUNITY)
Admission: EM | Disposition: A | Discharge status: Home / Self Care | Payer: Self-pay | Source: Home / Self Care | Attending: Internal Medicine

## 2013-03-22 DIAGNOSIS — I208 Other forms of angina pectoris: Secondary | ICD-10-CM | POA: Insufficient documentation

## 2013-03-22 DIAGNOSIS — I2510 Atherosclerotic heart disease of native coronary artery without angina pectoris: Principal | ICD-10-CM

## 2013-03-22 DIAGNOSIS — I209 Angina pectoris, unspecified: Secondary | ICD-10-CM | POA: Diagnosis present

## 2013-03-22 HISTORY — PX: FRACTIONAL FLOW RESERVE WIRE: SHX5839

## 2013-03-22 HISTORY — PX: CORONARY ANGIOPLASTY WITH STENT PLACEMENT: SHX49

## 2013-03-22 HISTORY — PX: LEFT HEART CATHETERIZATION WITH CORONARY ANGIOGRAM: SHX5451

## 2013-03-22 HISTORY — PX: PERCUTANEOUS CORONARY STENT INTERVENTION (PCI-S): SHX5485

## 2013-03-22 LAB — BASIC METABOLIC PANEL
CO2: 24 mEq/L (ref 19–32)
Calcium: 8.6 mg/dL (ref 8.4–10.5)
Creatinine, Ser: 0.96 mg/dL (ref 0.50–1.35)
GFR calc Af Amer: >90 mL/min (ref >90)
GFR calc non Af Amer: >90 mL/min (ref >90)
Sodium: 140 mEq/L (ref 135–145)

## 2013-03-22 LAB — CBC
Hemoglobin: 14.8 g/dL (ref 13.0–17.0)
MCH: 31.5 pg (ref 26.0–34.0)
Platelets: 230 10*3/uL (ref 150–400)
RBC: 4.7 MIL/uL (ref 4.22–5.81)
WBC: 8.1 10*3/uL (ref 4.0–10.5)

## 2013-03-22 LAB — GLUCOSE, CAPILLARY
Glucose-Capillary: 123 mg/dL — ABNORMAL HIGH (ref 70–99)
Glucose-Capillary: 96 mg/dL (ref 70–99)
Glucose-Capillary: 124 mg/dL — ABNORMAL HIGH (ref 70–99)
Glucose-Capillary: 143 mg/dL — ABNORMAL HIGH (ref 70–99)

## 2013-03-22 LAB — POCT ACTIVATED CLOTTING TIME: Activated Clotting Time: 396 seconds

## 2013-03-22 LAB — HEPARIN LEVEL (UNFRACTIONATED): Heparin Unfractionated: 0.37 IU/mL (ref 0.30–0.70)

## 2013-03-22 LAB — PROTIME-INR
INR: 0.97 (ref 0.00–1.49)
Prothrombin Time: 12.7 seconds (ref 11.6–15.2)

## 2013-03-22 LAB — TROPONIN I: Troponin I: <0.3 ng/mL (ref <0.30)

## 2013-03-22 SURGERY — LEFT HEART CATHETERIZATION WITH CORONARY ANGIOGRAM
Anesthesia: LOCAL | Site: Hand | Laterality: Right

## 2013-03-22 MED ORDER — SODIUM CHLORIDE 0.9 % IV SOLN: 250.0000 mL | INTRAVENOUS | Status: DC | PRN

## 2013-03-22 MED ORDER — MIDAZOLAM HCL 2 MG/2ML IJ SOLN
INTRAMUSCULAR | Status: AC
  Filled 2013-03-22: qty 2

## 2013-03-22 MED ORDER — HEPARIN (PORCINE) IN NACL 2-0.9 UNIT/ML-% IJ SOLN
INTRAMUSCULAR | Status: AC
  Filled 2013-03-22: qty 1000

## 2013-03-22 MED ORDER — AMIODARONE HCL 150 MG/3ML IV SOLN
INTRAVENOUS | Status: AC
  Filled 2013-03-22: qty 3

## 2013-03-22 MED ORDER — METOPROLOL TARTRATE 1 MG/ML IV SOLN
INTRAVENOUS | Status: AC
  Filled 2013-03-22: qty 5

## 2013-03-22 MED ORDER — ADENOSINE 12 MG/4ML IV SOLN
16.0000 mL | Freq: Once | INTRAVENOUS | Status: DC
  Filled 2013-03-22: qty 16

## 2013-03-22 MED ORDER — METOPROLOL TARTRATE 1 MG/ML IV SOLN
5.0000 mg | INTRAVENOUS | Status: AC
  Administered 2013-03-22 (×2): 5 mg via INTRAVENOUS

## 2013-03-22 MED ORDER — BIVALIRUDIN 250 MG IV SOLR
INTRAVENOUS | Status: AC
  Filled 2013-03-22: qty 250

## 2013-03-22 MED ORDER — NITROGLYCERIN 0.4 MG/SPRAY TL SOLN
1.0000 | Status: DC | PRN
  Administered 2013-03-22: 1 via SUBLINGUAL
  Filled 2013-03-22: qty 4.9

## 2013-03-22 MED ORDER — TICAGRELOR 90 MG PO TABS
90.0000 mg | ORAL_TABLET | Freq: Two times a day (BID) | ORAL | Status: DC
  Administered 2013-03-22 – 2013-03-23 (×2): 90 mg via ORAL
  Filled 2013-03-22 (×3): qty 1

## 2013-03-22 MED ORDER — SODIUM CHLORIDE 0.9 % IJ SOLN: 3.0000 mL | Freq: Two times a day (BID) | INTRAMUSCULAR | Status: DC

## 2013-03-22 MED ORDER — VERAPAMIL HCL 2.5 MG/ML IV SOLN
INTRAVENOUS | Status: AC
  Filled 2013-03-22: qty 2

## 2013-03-22 MED ORDER — NITROGLYCERIN 0.2 MG/ML ON CALL CATH LAB
INTRAVENOUS | Status: AC
  Filled 2013-03-22: qty 1

## 2013-03-22 MED ORDER — DEXTROSE 5 % IV SOLN
150.0000 mg | Freq: Once | INTRAVENOUS | Status: AC
  Administered 2013-03-22: 150 mg via INTRAVENOUS

## 2013-03-22 MED ORDER — SODIUM CHLORIDE 0.9 % IV SOLN: 1.0000 mL/kg/h | INTRAVENOUS | Status: AC

## 2013-03-22 MED ORDER — SODIUM CHLORIDE 0.9 % IJ SOLN: 3.0000 mL | INTRAMUSCULAR | Status: DC | PRN

## 2013-03-22 MED ORDER — NITROGLYCERIN 0.4 MG/SPRAY TL SOLN
Status: AC
  Filled 2013-03-22: qty 4.9

## 2013-03-22 MED ORDER — HEPARIN SODIUM (PORCINE) 1000 UNIT/ML IJ SOLN
INTRAMUSCULAR | Status: AC
  Filled 2013-03-22: qty 1

## 2013-03-22 MED ORDER — TICAGRELOR 90 MG PO TABS
ORAL_TABLET | ORAL | Status: AC
  Filled 2013-03-22: qty 2

## 2013-03-22 MED ORDER — MORPHINE SULFATE 2 MG/ML IJ SOLN
2.0000 mg | INTRAMUSCULAR | Status: DC | PRN
  Administered 2013-03-22: 2 mg via INTRAVENOUS

## 2013-03-22 MED ORDER — ASPIRIN 81 MG PO CHEW
324.0000 mg | CHEWABLE_TABLET | Freq: Once | ORAL | Status: AC
  Administered 2013-03-22: 324 mg via ORAL
  Filled 2013-03-22: qty 4

## 2013-03-22 MED ORDER — SODIUM CHLORIDE 0.9 % IV SOLN
0.2500 mg/kg/h | INTRAVENOUS | Status: AC
  Filled 2013-03-22: qty 250

## 2013-03-22 MED ORDER — MORPHINE SULFATE 2 MG/ML IJ SOLN
INTRAMUSCULAR | Status: AC
  Filled 2013-03-22: qty 1

## 2013-03-22 MED ORDER — LIDOCAINE HCL (PF) 1 % IJ SOLN
INTRAMUSCULAR | Status: AC
  Filled 2013-03-22: qty 30

## 2013-03-22 MED ORDER — FENTANYL CITRATE 0.05 MG/ML IJ SOLN
INTRAMUSCULAR | Status: AC
  Filled 2013-03-22: qty 2

## 2013-03-22 MED ORDER — SODIUM CHLORIDE 0.9 % IV SOLN
0.2500 mg/kg/h | INTRAVENOUS | Status: DC
  Filled 2013-03-22: qty 250

## 2013-03-22 NOTE — H&P (View-Only) (Signed)
Subjective:  Intermittent CP - mild overnight.  "Little Twinges: He notes decreased exertion tolerance.  Objective:  Vital Signs in the last 24 hours: Temp:  [97.5 F (36.4 C)-98.7 F (37.1 C)] 98.5 F (36.9 C) (10/08 0745) Pulse Rate:  [66-82] 79 (10/07 2311) Resp:  [11-20] 18 (10/07 2311) BP: (109-150)/(53-96) 150/96 mmHg (10/08 0745) SpO2:  [93 %-100 %] 96 % (10/08 0745) Weight:  [216 lb 14.9 oz (98.4 kg)-220 lb 12.8 oz (100.154 kg)] 216 lb 14.9 oz (98.4 kg) (10/07 1723)  Intake/Output from previous day: 10/07 0701 - 10/08 0700 In: 1551.3 [P.O.:510; I.V.:1041.3] Out: 1450 [Urine:1450] Intake/Output from this shift: Total I/O In: 10 [I.V.:10] Out: -   Physical Exam: General appearance: alert, cooperative, appears stated age and no distress Neck: no adenopathy, no carotid bruit, no JVD and supple, symmetrical, trachea midline Lungs: clear to auscultation bilaterally and normal percussion bilaterally Heart: regular rate and rhythm, S1, S2 normal, no murmur, click, rub or gallop and normal apical impulse Abdomen: soft, non-tender; bowel sounds normal; no masses,  no organomegaly Extremities: extremities normal, atraumatic, no cyanosis or edema Pulses: 2+ and symmetric Neurologic: Grossly normal  Lab Results:  Recent Labs  03/21/13 1230 03/22/13 0500  WBC 6.8 8.1  HGB 13.8 14.8  PLT 237 230    Recent Labs  03/21/13 1230 03/22/13 0500  NA 138 140  K 3.8 4.1  CL 100 105  CO2 27 24  GLUCOSE 155* 142*  BUN 13 16  CREATININE 1.02 0.96    Recent Labs  03/21/13 2240 03/22/13 0500  TROPONINI <0.30 <0.30   Imaging: Imaging results have been reviewed  Cardiac Studies:  Assessment/Plan:  Principal Problem:   Angina, class IV Active Problems:   Tobacco abuse   Unstable angina   HTN (hypertension)   HLD (hyperlipidemia)   CAD (coronary artery disease)   DM2 (diabetes mellitus, type 2)   CKD (chronic kidney disease) stage 2, GFR 60-89 ml/min  Plan per  initial evaluation was LHC +/- PCI today.  Had moderate CAD by recent cath -- with persistent / prolonged CP & negative enzymes, he has r/o for MI.  On BB & statin along with ASA.  Smoking cessation is vital.    LOS: 1 day    HARDING,DAVID W 03/22/2013, 8:15 AM    

## 2013-03-22 NOTE — Progress Notes (Addendum)
Subjective:  Intermittent CP - mild overnight.  "Little Twinges: He notes decreased exertion tolerance.  Objective:  Vital Signs in the last 24 hours: Temp:  [97.5 F (36.4 C)-98.7 F (37.1 C)] 98.5 F (36.9 C) (10/08 0745) Pulse Rate:  [66-82] 79 (10/07 2311) Resp:  [11-20] 18 (10/07 2311) BP: (109-150)/(53-96) 150/96 mmHg (10/08 0745) SpO2:  [93 %-100 %] 96 % (10/08 0745) Weight:  [216 lb 14.9 oz (98.4 kg)-220 lb 12.8 oz (100.154 kg)] 216 lb 14.9 oz (98.4 kg) (10/07 1723)  Intake/Output from previous day: 10/07 0701 - 10/08 0700 In: 1551.3 [P.O.:510; I.V.:1041.3] Out: 1450 [Urine:1450] Intake/Output from this shift: Total I/O In: 10 [I.V.:10] Out: -   Physical Exam: General appearance: alert, cooperative, appears stated age and no distress Neck: no adenopathy, no carotid bruit, no JVD and supple, symmetrical, trachea midline Lungs: clear to auscultation bilaterally and normal percussion bilaterally Heart: regular rate and rhythm, S1, S2 normal, no murmur, click, rub or gallop and normal apical impulse Abdomen: soft, non-tender; bowel sounds normal; no masses,  no organomegaly Extremities: extremities normal, atraumatic, no cyanosis or edema Pulses: 2+ and symmetric Neurologic: Grossly normal  Lab Results:  Recent Labs  03/21/13 1230 03/22/13 0500  WBC 6.8 8.1  HGB 13.8 14.8  PLT 237 230    Recent Labs  03/21/13 1230 03/22/13 0500  NA 138 140  K 3.8 4.1  CL 100 105  CO2 27 24  GLUCOSE 155* 142*  BUN 13 16  CREATININE 1.02 0.96    Recent Labs  03/21/13 2240 03/22/13 0500  TROPONINI <0.30 <0.30   Imaging: Imaging results have been reviewed  Cardiac Studies:  Assessment/Plan:  Principal Problem:   Angina, class IV Active Problems:   Tobacco abuse   Unstable angina   HTN (hypertension)   HLD (hyperlipidemia)   CAD (coronary artery disease)   DM2 (diabetes mellitus, type 2)   CKD (chronic kidney disease) stage 2, GFR 60-89 ml/min  Plan per  initial evaluation was LHC +/- PCI today.  Had moderate CAD by recent cath -- with persistent / prolonged CP & negative enzymes, he has r/o for MI.  On BB & statin along with ASA.  Smoking cessation is vital.    LOS: 1 day    HARDING,DAVID W 03/22/2013, 8:15 AM

## 2013-03-22 NOTE — Interval H&P Note (Signed)
History and Physical Interval Note:  03/22/2013 8:17 AM  [name]  has presented today for surgery, with the diagnosis of Chest pain - CLASS IV ANGINA / UNSTABLE ANGINA  The various methods of treatment have been discussed with the patient and family.  After consideration of risks, benefits and other options for treatment, the patient has consented to  Procedure(s): LEFT HEART CATHETERIZATION WITH CORONARY ANGIOGRAM (N/A) +/- PERCUTANEOUS CORONARY INTERVENTION as a surgical intervention .  The patient's history has been reviewed, patient examined, no change in status, stable for surgery.  I have reviewed the patient's chart and labs.  Questions were answered to the patient's satisfaction.     HARDING,DAVID W Cath Lab Visit (complete for each Cath Lab visit)  Clinical Evaluation Leading to the Procedure:   ACS: no  Non-ACS:    Anginal Classification: CCS IV  Anti-ischemic medical therapy: Minimal Therapy (1 class of medications)  Non-Invasive Test Results: Intermediate-risk stress test findings: cardiac mortality 1-3%/year  Prior CABG: No previous CABG

## 2013-03-22 NOTE — CV Procedure (Signed)
CARDIAC CATHETERIZATION AND PERCUTANEOUS CORONARY INTERVENTION REPORT  NAME:  Matthew Horton   MRN: [id number] DOB:  05-27-1966   ADMIT DATE: 03/21/2013 Procedure Date: 03/22/2013  INTERVENTIONAL CARDIOLOGIST: Marykay Lex, M.D., MS PRIMARY CARE PROVIDER: No primary provider on file. PRIMARY CARDIOLOGIST:  PATIENT:  Matthew CUDWORTH is a 47 y.o. male with PMH of HTN, DM-2, HLD, OSA & long-standing tobacco abuse with known moderate coronary disease by catheterization in March of 2014 for an abnormal stress test.  At that time he shown to have moderate two-vessel disease in the diagonal branch of the LAD as well as the RCA.  There's also ostial OM1 lesion.  An FFR of the RCA 70% lesion wasn't not significant.  Medical therapy was recommended, and he has still had intermittent episodes of chest discomfort since.  Despite being on aggressive therapy, he presented again on the morning of October 7 with substernal chest pain like a brick sitting on his chest.  This was relieved by nitroglycerin.  Based on his noncoronary history as high likelihood for progression of disease has continued to smoke on his referred for relook catheterization plus minus PCI.  PRE-OPERATIVE DIAGNOSIS:    Class 3-4 angina  Unstable angina  PROCEDURES PERFORMED:    LEFT HEART CATHETERIZATION WITH CORONARY ANGIOGRAPHY  LEFT VENTRICULOGRAPHY  FFR MEASUREMENT OF BOTH LAD AND D1 -- 0.90 and 0.93 respectively  FFR MEASUREMENT OF BOTH MID PDA AND MID RCA -- 0.57 (combined) 0.73 (pullback for mid RCA lesion only)  PERCUTANEOUS CORONARY INTERVENTION OF MID PDA -- Promus Premier DES 2.25 mm x 12 mm (2.5 mm)  PERCUTANEOUS CORONARY INTERVENTION OF MID RCA -- Promus Premier DES 3.0 mm x 16 mm (3.45 mm)  PROCEDURE:Consent:  Risks of procedure as well as the alternatives and risks of each were explained to the (patient/caregiver).  Consent for procedure obtained. Consent for signed by MD and patient with RN witness --  placed on chart.   PROCEDURE: The patient was brought to the 2nd Floor [facility] in the fasting state and prepped and draped in the usual sterile fashion for Right groin or radial access. A modified Allen's test with plethysmography was performed, revealing excellent Ulnar artery collateral flow.  Sterile technique was used including antiseptics, cap, gloves, gown, hand hygiene, mask and sheet.  Skin prep: Chlorhexidine.  Time Out: Verified patient identification, verified procedure, site/side was marked, verified correct patient position, special equipment/implants available, medications/allergies/relevent history reviewed, required imaging and test results available.  Performed  Access: Right Radial Artery; 6 Fr Sheath -- Seldinger technique (Angiocath Micropuncture Kit)  Radial Cocktail - IA 10 mL, IV Heparin 4500 Units Diagnostic:  TIG 4.0, JL 3.5, Angled Pigtail -- JR 4 Guide for PCI -- advanced and exchanged over long exchange safety J-wire  Right Coronary Artery Angiography: TIG 4.0  Left Coronary Artery Angiography: JL 35  LV Hemodynamics (LV Gram): Angled pigtail  TR Band:  1120 Hours, 15 mL air  MEDICATIONS:  Anesthesia:  Local Lidocaine 2 ml  Sedation:  3 mg IV Versed, 150 mcg IV fentanyl ;   Omnipaque Contrast: 220 ml  Anticoagulation:  IV Heparin 4500 Units ; Angiomax Bolus & drip  Anti-Platelet Agent:  Brilinta 180 mg  Adenosine: 140 mcg/Kg/min -- 3 runs for 90-120 sec  IC NTG injection: 200 mcg x1  Hemodynamics:  Central Aortic / Mean Pressures: 1:15/78 mmHg; 96 mmHg  Left Ventricular Pressures / EDP: 116/8 mmHg; 14 mmHg  Left Ventriculography:  EF:  55-60 %  Wall Motion: Normal  Coronary Anatomy:  Left Main: Large-caliber vessel, bifurcates distally into the LAD, and Circumflex.  Angiographically normal with possible 10% distal taper. LAD: Large-caliber vessel with a very proximal first diagonal branch.  After that branch  point there is a roughly 50-60% stenosis before the vessel normalizes after giving rise to a large septal perforator.  The vessel then courses down to taper around the apex.  The remainder the vessel has minimal luminal irregularities.  D1: Large-caliber major diagonal branch that is at least a same size as the LAD.  There is a proximal 60-70% stenosis that involves a small branch with ostial 90% stenosis.  Unfortunately this vessel is not large enough to be intervened upon.  Left Circumflex: Large caliber vessel with a smooth 40% proximal stenosis as it courses into the AV groove.  At this point gives off a moderate caliber OM1 that has an ostial 50-60% stenosis.  The circumflex then courses into the AV groove and bifurcates distally into OM 2 and L. PL 1.  The remainder the system is mildly tortuous with mild luminal irregularities.  LPL 1 has a focal 20-30% lesion.   RCA: Large-caliber dominant vessel is quite tortuous.  Just prior to the second band there is a focal 70-80% lesion with tapering up to that.  This does appear to be somewhat progressed since March.  The vessel then has a high bifurcation into a small posterior lateral system and a long continuation of the Right Posterior.  Ascending Artery.    RPDA: Is ischial long moderate to large caliber vessel that has a major branch in the mid segments.  At this branch point there is a 70-80% stenosis that appears to be progressed from March.  RPL Sysytem:The RPAV in RCA moderate caliber vessel that gives rise to the AV nodal artery and terminates as 2 small posterior lateral branches.  This actually is a small branch of what is below the terminal RCA and PDA.  After reviewing the initial angiography was concerned about potential progression of disease in the LAD and diagonal branch as well as in the mid RCA in mid PDA.  Basilar that in his vessels and evaluated by FFR in March and were found to be not physiologically significant, I decided to  evaluate the left system first and/or to ensure these stenoses had not progressed to a significant level.  The plan was then to proceed with FFR bottle PCI of the RCA if the Left Coronary Artery/LAD-Diagonal system was found to be physiologically insignificant.  Percutaneous Coronary Intervention/FFR of LAD-Diagonal:  Guide: 6 Fr   XB LAD Guidewire: Pro-Water  D1: The guidewire was advanced down across the lesion in the first diagonal branch.  Next the ACIST FFR catheter was then advanced outside of the guide catheter for normalization, then advanced across the lesion.  Adenosine was administered at a standard rate for total 2 minutes.  Resting FFR: 0.98 --> peak FFR of 0.93; not physiologically significant  LAD: Guidewire was then pulled back into the LAD advanced across the LAD lesion.  The ACIST FFR catheter was advanced across the lesion in the LAD and adenosine was administered and standard rate for total of 90 seconds.  Resting FFR: 0.97 --> peak FFR 0.90; not physiologically significant  Percutaneous Coronary Intervention: Mid RCA, mid PDA Guide: 6 Fr   JR 4 Guidewire: Pro-water   FFR Measurement: Lesion #1, mid PDA: With the guidewire advanced down to the distal PDA, the  ACIST FFR catheter was advanced across the PDA lesion.  Adenosine was administered a standard rate for total of 90 seconds  Resting FFR on first glance was 0.78, however normalized at 0.88 --> peak FFR 0.57; highly physiologically significant combination.  The catheter was then pulled back proximal to the PDA lesion to evaluate the mid RCA lesion.   Lesion #2, Mid RCA: With the FFR catheter pulled into the distal RCA/proximal PDA, the adenosine infusion was continued for an additional 90 seconds.  Peak FFR 0.73 --> highly significant, indicating proximal lesion is also significant  The decision was then made to proceed with PCI on both the mid RCA and mid PDA lesions.  PCI:  Lesion #1, mid PDA:  80-90% (based  on FFR); TIMI 3 flow pre-and post  Predilation Balloon: Emerge Monorail 2.0 mm x 8 mm; crosses the branch of the PDA  6 Atm x 30 Sec  Stent: Promus Premier DES 2.25 mm x 12 mm;   12 Atm x 30 Sec, 18 Atm x 15 Sec  Post-dilation Balloon: [place] Trek 2.5 mm x 8 mm;   10 Atm x 45 Sec, Final Diameter: 2.5 mm   Lesion #1, mid RCA:  70-80%; TIMI 3 flow pre-and post  Predilation Balloon: Trek 2.5 mm x12 mm; crosses RV marginal branch  8 Atm x 30 Sec  Stent: Promus Premier DES 3.0 mm x 16 mm;   14 Atm x 30 Sec,   Post-dilation Balloon: [place] Trek 3.5 mm x 12 mm;   10 Atm x 45 Sec - x 2 inflations distal and proximal, Final Diameter: 3.45 mm  Post deployment angiography in multiple views, with and without guidewire in place revealed excellent stent deployment and lesion coverage.  There was no evidence of dissection or perforation.  Following the placement of a TR band, the telemetry indicated an irregularly irregular heart rhythm.  An ECG in the holding area and confirmed atrial fibrillation.  PATIENT DISPOSITION:    The patient was transferred to the PACU holding area in a hemodynamicaly stable, chest pain free condition.  The patient tolerated the procedure well, and there were no complications.  EBL:   < 20 ml  The patient was stable before, during, and after the procedure.  POST-OPERATIVE DIAGNOSIS:    Severe 2 site CAD in mid RCA in mid PDA demonstrating significant progression of disease since March of 2014.  Moderate disease of the LAD and D1 with minimal progression of disease, with the exception of a small branch of D1 with 90% ostial stenosis.  Moderate ostial OM1 disease, stable  Normal LVEF and EDP  Post PCI A. fib with RVR  PLAN OF CARE:  Transferred to [facility] for post radial cath care.  Continue Angiomax at current rate (reduced rate) for least 2 hours.  Dual antiplatelet therapy for a minimum of 1 year.  We'll give 2 doses of IV Lopressor in the holding area to  see if we can break atrial fibrillation, if not will use IV bolus of amiodarone.  If A. fib persists, will then restart heparin infusion 6 hours post sheath pull.  With postop A. fib, he will likely need a monitor on discharge to see if he has any further episodes.  We'll not administer long-term oral anticoagulation therapy at this time.  On discharge she will followup with Mr. Arnette Felts, PA at [facility].   Marykay Lex, M.D., M.S. [facility] [address] [place], [place]  [address]  [phone]  03/22/2013 11:21 AM

## 2013-03-22 NOTE — Care Management Note (Signed)
    Page 1 of 1   03/22/2013     10:36:13 AM   CARE MANAGEMENT NOTE 03/22/2013  Patient:  Matthew Horton, Matthew Horton   Account Number:  [id number]  Date Initiated:  03/22/2013  Documentation initiated by:  Junius Creamer  Subjective/Objective Assessment:   adm w angina     Action/Plan:   lives w wife   Anticipated DC Date:     Anticipated DC Plan:  HOME/SELF CARE         Choice offered to / List presented to:             Status of service:   Medicare Important Message given?   (If response is "NO", the following Medicare IM given date fields will be blank) Date Medicare IM given:   Date Additional Medicare IM given:    Discharge Disposition:  HOME/SELF CARE  Per UR Regulation:  Reviewed for med. necessity/level of care/duration of stay  If discussed at Long Length of Stay Meetings, dates discussed:    Comments:

## 2013-03-22 NOTE — Progress Notes (Signed)
TR BAND REMOVAL  LOCATION:  right radial  DEFLATED PER PROTOCOL:  yes  TIME BAND OFF / DRESSING APPLIED:   1810   SITE UPON ARRIVAL:   Level 0  SITE AFTER BAND REMOVAL:  Level 0  REVERSE ALLEN'S TEST:    positive  CIRCULATION SENSATION AND MOVEMENT:  Within Normal Limits  yes  COMMENTS:    

## 2013-03-23 DIAGNOSIS — Z9861 Coronary angioplasty status: Secondary | ICD-10-CM | POA: Insufficient documentation

## 2013-03-23 DIAGNOSIS — I4891 Unspecified atrial fibrillation: Secondary | ICD-10-CM

## 2013-03-23 DIAGNOSIS — Z955 Presence of coronary angioplasty implant and graft: Secondary | ICD-10-CM

## 2013-03-23 LAB — CBC
HCT: 43.3 % (ref 39.0–52.0)
Hemoglobin: 15.7 g/dL (ref 13.0–17.0)
MCHC: 36.3 g/dL — ABNORMAL HIGH (ref 30.0–36.0)
RBC: 4.93 MIL/uL (ref 4.22–5.81)
RDW: 12.5 % (ref 11.5–15.5)
WBC: 7.2 10*3/uL (ref 4.0–10.5)

## 2013-03-23 LAB — BASIC METABOLIC PANEL
Chloride: 102 mEq/L (ref 96–112)
Creatinine, Ser: 0.98 mg/dL (ref 0.50–1.35)
GFR calc Af Amer: >90 mL/min (ref >90)
GFR calc non Af Amer: >90 mL/min (ref >90)
Potassium: 4 mEq/L (ref 3.5–5.1)

## 2013-03-23 LAB — GLUCOSE, CAPILLARY: Glucose-Capillary: 152 mg/dL — ABNORMAL HIGH (ref 70–99)

## 2013-03-23 MED ORDER — LISINOPRIL 5 MG PO TABS
5.0000 mg | ORAL_TABLET | Freq: Every day | ORAL | Status: DC
  Administered 2013-03-23: 10:00:00 5 mg via ORAL
  Filled 2013-03-23 (×2): qty 1

## 2013-03-23 MED ORDER — TICAGRELOR 90 MG PO TABS: 90.0000 mg | ORAL_TABLET | Freq: Two times a day (BID) | ORAL | Status: DC

## 2013-03-23 MED ORDER — NITROGLYCERIN 0.4 MG SL SUBL: 0.4000 mg | SUBLINGUAL_TABLET | SUBLINGUAL | Status: DC | PRN

## 2013-03-23 MED ORDER — LISINOPRIL 5 MG PO TABS: 5.0000 mg | ORAL_TABLET | Freq: Every day | ORAL | Status: DC

## 2013-03-23 MED FILL — Sodium Chloride IV Soln 0.9%: INTRAVENOUS | Qty: 50 | Status: AC

## 2013-03-23 NOTE — Discharge Summary (Signed)
Pt seen & examined on AM of d/c.  Ambulating in hallway with no further Angina.  Stable post PCI of RCA & rPDA.   No further Afib.  -- I suspect that this was an isolated episodes related to post PCI.  Will need to consider OP monitor to detect potential Afib recurrence, but he is reluctant due to concerns with his employer CM assisting with Brilinta (Needs DAPT x 1 yr minimum)  BP up -- agree with ACE-I.  Otherwise stable. On statin & BB.  Ready for d/c.  Marykay Lex, M.D., M.S. [facility] [address] [place], [place]  [address]  [phone] Pager # [phone] 03/23/2013 2:44 PM

## 2013-03-23 NOTE — Progress Notes (Signed)
CARDIAC REHAB PHASE I   PRE:  Rate/Rhythm: 78 SR  BP:  Supine: 143/93  Sitting:   Standing:    SaO2:   MODE:  Ambulation: 1000 ft   POST:  Rate/Rhythm: 86 SR  BP:  Supine:   Sitting: 122/74  Standing:    SaO2:  1610-9604 Pt tolerated ambulation well without c/o of cp or SOB. VS stable. Completed stent education with pt. He voices understanding. Pt declines Outpt.CRP due to his work hours. Discussed smoking cessation with pt. I gave him tips for quitting and coaching contact number. He states that he has quit before for 7 years and that he did it cold [place]. He said that quitting is not going to be hard for him at all.He seems motivated to making changes.  Melina Copa RN 03/23/2013 9:14 AM

## 2013-03-23 NOTE — Discharge Summary (Signed)
Physician Discharge Summary  Patient ID: Matthew Horton MRN: [id number] DOB/AGE: [birth year] 47 y.o.  Admit date: 03/21/2013 Discharge date: 03/23/2013  Admission Diagnoses: Angina, Class IV  Discharge Diagnoses:  Principal Problem:   Angina, class IV Active Problems:   Tobacco abuse   HTN (hypertension)   DM2 (diabetes mellitus, type 2)   HLD (hyperlipidemia)   CKD (chronic kidney disease) stage 2, GFR 60-89 ml/min   CAD (coronary artery disease)   Unstable angina   Atrial fibrillation -periprocedural   Presence of drug coated stent in right coronary artery   Discharged Condition: stable  Hospital Course: Matthew Horton is a 47 y.o. male with PMH of HTN, DM-2, HLD, OSA & long-standing tobacco abuse with known moderate coronary disease by catheterization in March of 2014 for an abnormal stress test. At that time he shown to have moderate two-vessel disease in the diagonal branch of the LAD as well as the RCA. There was also an ostial OM1 lesion. An FFR of the RCA 70% lesion wasn't not significant. Medical therapy was recommended, The patient continued to have intermittent episodes of chest discomfort since that time. Despite being on aggressive therapy, he presented again on the morning of March 21, 2013 with substernal chest pain like a brick sitting on his chest. This was relieved by nitroglycerin. His EKG demonstrated no acute changes. He was admitted and ruled out for MI with negative troponins x 3. However, considering his history and high likelihood for progression of disease, a relook cath was recommended. This was performed by Dr. Herbie Baltimore, via the right radial artery. He was found to have severe 2 site CAD in the mid RCA and mid PDA, demonstrating significant progression of disease since March of 2014. Both lesions were treated with DES. He had normal LVEF and EDP. He tolerated the procedure well but was noted to have post PCI atrial fibrillation with RVR. He left the cath  lab in stable condition. He had no further A-fib on telemetry. He remained CP free. The right radial access site remained stable. His labs were stable. He did have mild hypertension and was started on a low dose ACE-I. He was resumed on his BB and statin. He was placed on DAPT with ASA and Brilinta. He was last seen and examined by Dr. Herbie Baltimore, who determined he was stable for discharge home. He will follow up with Dr. Allyson Sabal.   Consults: None  Significant Diagnostic Studies:   LHC 03/22/13 POST-OPERATIVE DIAGNOSIS:  Severe 2 site CAD in mid RCA in mid PDA demonstrating significant progression of disease since March of 2014.  Moderate disease of the LAD and D1 with minimal progression of disease, with the exception of a small branch of D1 with 90% ostial stenosis.  Moderate ostial OM1 disease, stable  Normal LVEF and EDP  Post PCI A. fib with RVR   Treatments: See Hospital Course  Discharge Exam: Blood pressure 143/93, pulse 73, temperature 98 F (36.7 C), temperature source Oral, resp. rate 18, height 5\' 10"  (1.778 m), weight 216 lb 7.9 oz (98.2 kg), SpO2 98.00%.   Disposition: 01-Home or Self Care      Discharge Orders   Future Orders Complete By Expires   Diet - low sodium heart healthy  As directed    Diet - low sodium heart healthy  As directed    Discharge instructions  As directed    Comments:     Wait until Saturday 03/25/13 to resume Metformin   Driving  Restrictions  As directed    Comments:     No Driving for 3 days   Increase activity slowly  As directed    Increase activity slowly  As directed    Lifting restrictions  As directed    Comments:     No lifting more than 1/2 gallon of milk for 3 days       Medication List         aspirin EC 81 MG tablet  Take 81 mg by mouth every morning.     lisinopril 5 MG tablet  Commonly known as:  PRINIVIL,ZESTRIL  Take 1 tablet (5 mg total) by mouth daily.     metFORMIN 500 MG tablet  Commonly known as:  GLUCOPHAGE    Take 500 mg by mouth every evening.     metoprolol 50 MG tablet  Commonly known as:  LOPRESSOR  Take 50 mg by mouth 2 (two) times daily.     nitroGLYCERIN 0.4 MG SL tablet  Commonly known as:  NITROSTAT  Place 1 tablet (0.4 mg total) under the tongue every 5 (five) minutes as needed for chest pain.     simvastatin 20 MG tablet  Commonly known as:  ZOCOR  Take 20 mg by mouth every evening.     Ticagrelor 90 MG Tabs tablet  Commonly known as:  BRILINTA  Take 1 tablet (90 mg total) by mouth 2 (two) times daily.     Ticagrelor 90 MG Tabs tablet  Commonly known as:  BRILINTA  Take 1 tablet (90 mg total) by mouth 2 (two) times daily.       Follow-up Information   Follow up with [facility]. (our office will call you with a follow-up appoitment)    Contact information:   [phone]     TIME SPENT ON DISCHARGE, INCLUDING, PHYSICIAN TIME: > 30 MINUTES Signed: Allayne Butcher, PA-C 03/23/2013, 9:39 AM

## 2013-03-23 NOTE — Progress Notes (Signed)
Subjective: No complaints. Denies further chest pain.   Objective: Vital signs in last 24 hours: Temp:  [98 F (36.7 C)-98.7 F (37.1 C)] 98 F (36.7 C) (10/09 0822) Pulse Rate:  [70-94] 73 (10/09 0822) Resp:  [18-20] 18 (10/09 0822) BP: (134-161)/(79-101) 143/93 mmHg (10/09 0822) SpO2:  [96 %-98 %] 98 % (10/09 0822) Weight:  [216 lb 7.9 oz (98.2 kg)] 216 lb 7.9 oz (98.2 kg) (10/09 0004) Last BM Date: 03/21/13  Intake/Output from previous day: 10/08 0701 - 10/09 0700 In: 2325.7 [P.O.:1440; I.V.:885.7] Out: 1250 [Urine:1250] Intake/Output this shift:    Medications Current Facility-Administered Medications  Medication Dose Route Frequency Provider Last Rate Last Dose  . 0.9 %  sodium chloride infusion  1,000 mL Intravenous Continuous Celene Kras, MD 20 mL/hr at 03/21/13 1248 1,000 mL at 03/21/13 1248  . acetaminophen (TYLENOL) tablet 650 mg  650 mg Oral Q4H PRN Brittainy Simmons, PA-C   650 mg at 03/22/13 1743  . ALPRAZolam Prudy Feeler) tablet 0.25 mg  0.25 mg Oral BID PRN Robbie Lis, PA-C   0.25 mg at 03/22/13 0758  . aspirin EC tablet 81 mg  81 mg Oral q morning - 10a Brittainy Simmons, PA-C      . lisinopril (PRINIVIL,ZESTRIL) tablet 5 mg  5 mg Oral Daily Brittainy Simmons, PA-C      . metoprolol (LOPRESSOR) tablet 50 mg  50 mg Oral BID Brittainy Simmons, PA-C   50 mg at 03/22/13 2209  . morphine 2 MG/ML injection 2 mg  2 mg Intravenous Q1H PRN Marykay Lex, MD   2 mg at 03/22/13 1150  . nitroGLYCERIN (NITROLINGUAL) 0.4 MG/SPRAY spray 1 spray  1 spray Sublingual Q5 min PRN Marykay Lex, MD   1 spray at 03/22/13 1215  . nitroGLYCERIN (NITROSTAT) SL tablet 0.4 mg  0.4 mg Sublingual Q5 min PRN Celene Kras, MD      . ondansetron [facility]) injection 4 mg  4 mg Intravenous Q6H PRN Brittainy Simmons, PA-C      . simvastatin (ZOCOR) tablet 20 mg  20 mg Oral QPM Brittainy Simmons, PA-C   20 mg at 03/22/13 1743  . Ticagrelor (BRILINTA) tablet 90 mg  90 mg Oral BID Marykay Lex, MD   90 mg at 03/22/13 2209  . zolpidem (AMBIEN) tablet 5 mg  5 mg Oral QHS PRN,MR X 1 Brittainy Simmons, PA-C        PE: General appearance: alert, cooperative and no distress Lungs: clear to auscultation bilaterally Heart: regular rate and rhythm, S1, S2 normal, no murmur, click, rub or gallop Extremities: no LEE Pulses: 2+ and symmetric Skin: warm and dry Neurologic: Grossly normal  Lab Results  Recent Labs  03/21/13 1230 03/22/13 0500  WBC 6.8 8.1  HGB 13.8 14.8  HCT 40.8 41.3  PLT 237 230   BMET  Recent Labs  03/21/13 1230 03/22/13 0500  NA 138 140  K 3.8 4.1  CL 100 105  CO2 27 24  GLUCOSE 155* 142*  BUN 13 16  CREATININE 1.02 0.96  CALCIUM 9.3 8.6   PT/INR  Recent Labs  03/21/13 1230 03/22/13 0500  LABPROT 13.0 12.7  INR 1.00 0.97    Studies/Results:  LHC 03/22/13 POST-OPERATIVE DIAGNOSIS:  Severe 2 site CAD in mid RCA in mid PDA demonstrating significant progression of disease since March of 2014.  Moderate disease of the LAD and D1 with minimal progression of disease, with the exception of a small  branch of D1 with 90% ostial stenosis.  Moderate ostial OM1 disease, stable  Normal LVEF and EDP  Post PCI A. fib with RVR  Assessment/Plan    Principal Problem:   Angina, class IV Active Problems:   Tobacco abuse   HTN (hypertension)   DM2 (diabetes mellitus, type 2)   HLD (hyperlipidemia)   CKD (chronic kidney disease) stage 2, GFR 60-89 ml/min   CAD (coronary artery disease)   Unstable angina   Atrial fibrillation -periprocedural  Plan: S/p PCI to both the mid RCA and mid PDA with DES. On DAPT with ASA + Brilinta. No further chest pain. The right radial access site is stable. Ambulating w/o difficulty. Mildly hypertensive. Renal function is stable. Will add a low dose ACE-I. Will plan for discharge home today with f/u with Dr. Herbie Baltimore. ? OP heart monitor to assess for recurrence of afib.     LOS: 2 days    Brittainy M.  Sharol Harness, PA-C 03/23/2013 8:56 AM  I have seen & examined the patient this AM.  He was up pacing the halls - no further angina.  Wrist is stable No further Afib -- I suspect that this was an isolated episodes related to post PCI.  Will need to consider OP monitor to detect potential Afib recurrence, but he is reluctant due to concerns with his employer CM assisting with Brilinta (Needs DAPT x 1 yr minimum)  BP up -- agree with ACE-I.  Otherwise stable.  On statin & BB.  Ready for d/c.  Marykay Lex, MD

## 2013-03-29 ENCOUNTER — Encounter: Payer: Self-pay | Admitting: Internal Medicine

## 2013-03-30 ENCOUNTER — Ambulatory Visit (INDEPENDENT_AMBULATORY_CARE_PROVIDER_SITE_OTHER): Payer: Managed Care, Other (non HMO) | Admitting: Internal Medicine

## 2013-03-30 ENCOUNTER — Encounter: Payer: Self-pay | Admitting: Internal Medicine

## 2013-03-30 VITALS — BP 110/70 | HR 77 | Ht 70.0 in | Wt 219.7 lb

## 2013-03-30 DIAGNOSIS — I2510 Atherosclerotic heart disease of native coronary artery without angina pectoris: Secondary | ICD-10-CM

## 2013-03-30 DIAGNOSIS — E119 Type 2 diabetes mellitus without complications: Secondary | ICD-10-CM

## 2013-03-30 DIAGNOSIS — E785 Hyperlipidemia, unspecified: Secondary | ICD-10-CM

## 2013-03-30 DIAGNOSIS — Z9889 Other specified postprocedural states: Secondary | ICD-10-CM

## 2013-03-30 DIAGNOSIS — R9439 Abnormal result of other cardiovascular function study: Secondary | ICD-10-CM

## 2013-03-30 DIAGNOSIS — I10 Essential (primary) hypertension: Secondary | ICD-10-CM

## 2013-03-30 DIAGNOSIS — I200 Unstable angina: Secondary | ICD-10-CM

## 2013-03-30 DIAGNOSIS — N182 Chronic kidney disease, stage 2 (mild): Secondary | ICD-10-CM

## 2013-03-30 DIAGNOSIS — I209 Angina pectoris, unspecified: Secondary | ICD-10-CM

## 2013-03-30 DIAGNOSIS — I4891 Unspecified atrial fibrillation: Secondary | ICD-10-CM

## 2013-03-30 NOTE — Patient Instructions (Addendum)
Please have fasting blood work - nothing to eat/drink after midnight. Dr. Rennis Golden wants to check your cholesterol levels.  NMR with lipids.   Your physician wants you to follow-up in: 6 months. You will receive a reminder letter in the mail two months in advance. If you don't receive a letter, please call our office to schedule the follow-up appointment.

## 2013-04-03 ENCOUNTER — Encounter: Payer: Self-pay | Admitting: Internal Medicine

## 2013-04-03 ENCOUNTER — Telehealth: Payer: Self-pay | Admitting: Internal Medicine

## 2013-04-03 NOTE — Telephone Encounter (Signed)
Faxed clearance to return to work to [employer] on 04/03/13 at [phone]. Informed Mr. Millette of this.

## 2013-04-03 NOTE — Progress Notes (Signed)
OFFICE NOTE  Chief Complaint:  Hospital follow-up, marked improvement in dyspnea, no chest pain  Primary Care Physician: No PCP Per Patient  HPI:  Matthew Horton is a 47 y.o. male with PMH of HTN, DM-2, HLD, OSA & long-standing tobacco abuse with known moderate coronary disease by catheterization in March of 2014 for an abnormal stress test. At that time he shown to have moderate two-vessel disease in the diagonal branch of the LAD as well as the RCA. There was also an ostial OM1 lesion. An FFR of the RCA 70% lesion wasn't not significant. Medical therapy was recommended, The patient continued to have intermittent episodes of chest discomfort since that time. Despite being on aggressive therapy, he presented again on the morning of March 21, 2013 with substernal chest pain like a brick sitting on his chest. This was relieved by nitroglycerin. His EKG demonstrated no acute changes. He was admitted and ruled out for MI with negative troponins x 3. However, considering his history and high likelihood for progression of disease, a relook cath was recommended. This was performed by Dr. Herbie Baltimore, via the right radial artery. He was found to have severe 2 site CAD in the mid RCA and mid PDA, demonstrating significant progression of disease since March of 2014. Both lesions were treated with DES. He had normal LVEF and EDP. He tolerated the procedure well but was noted to have post PCI atrial fibrillation with RVR. He left the cath lab in stable condition. He had no further A-fib on telemetry. He remained CP free. The right radial access site remained stable. His labs were stable. He did have mild hypertension and was started on a low dose ACE-I. He was resumed on his BB and statin. He was placed on DAPT with ASA and Brilinta.  He returns today in follow-up and is feeling well. His radial cath site has a good pulse and there is good distal sensation and perfusion. He has had no further chest pain and no  significant DOE.  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  He reports that he has stopped smoking with this recent hospitalization.  PMHx:  Past Medical History  Diagnosis Date  . HTN (hypertension)   . DM2 (diabetes mellitus, type 2)   . CKD (chronic kidney disease) stage 2, GFR 60-89 ml/min   . HLD (hyperlipidemia)   . Tobacco abuse   . Cluster headache   . OSA (obstructive sleep apnea)   . Low testosterone   . Chest tightness   . Nonallopathic lesion of cervical region   . Fatigue   . Shortness of breath   . Torticollis, acute   . Coronary artery disease     Past Surgical History  Procedure Laterality Date  . Tonsillectomy    . Traumatic amputation and reattachment Left     Index finger  . Coronary angioplasty with stent placement  03/22/2013    Dr. Ranae Palms    FAMHx:  Family History  Problem Relation Age of Onset  . Coronary artery disease Father 35    MI and s/p CABG  . Diabetes Father   . Hypertension Father   . Hyperlipidemia Father   . Hyperlipidemia Mother     SOCHx:   reports that he quit smoking 12 days ago. His smoking use included Cigarettes. He has a 30 pack-year smoking history. He does not have any smokeless tobacco history on file. He reports  that he does not drink alcohol or use illicit drugs.  ALLERGIES:  No Known Allergies  ROS: A comprehensive review of systems was negative.  HOME MEDS: Current Outpatient Prescriptions  Medication Sig Dispense Refill  . aspirin EC 81 MG tablet Take 81 mg by mouth every morning.       Marland Kitchen lisinopril (PRINIVIL,ZESTRIL) 5 MG tablet Take 1 tablet (5 mg total) by mouth daily.  30 tablet  5  . metFORMIN (GLUCOPHAGE) 500 MG tablet Take 500 mg by mouth every evening.       . metoprolol (LOPRESSOR) 50 MG tablet Take 50 mg by mouth 2 (two) times daily.      . nitroGLYCERIN (NITROSTAT) 0.4 MG SL tablet Place 1 tablet (0.4 mg total) under the tongue every 5 (five)  minutes as needed for chest pain.  25 tablet  2  . simvastatin (ZOCOR) 20 MG tablet Take 40 mg by mouth every evening.       . Ticagrelor (BRILINTA) 90 MG TABS tablet Take 1 tablet (90 mg total) by mouth 2 (two) times daily.  60 tablet  10   No current facility-administered medications for this visit.    LABS/IMAGING: No results found for this or any previous visit (from the past 48 hour(s)). No results found.  VITALS: BP 110/70  Pulse 77  Ht 5\' 10"  (1.778 m)  Wt 219 lb 11.2 oz (99.655 kg)  BMI 31.52 kg/m2  EXAM: General appearance: alert and no distress Neck: no carotid bruit and no JVD Lungs: clear to auscultation bilaterally Heart: regular rate and rhythm, S1, S2 normal, no murmur, click, rub or gallop Abdomen: soft, non-tender; bowel sounds normal; no masses,  no organomegaly Extremities: extremities normal, atraumatic, no cyanosis or edema and radial site without hematoma, good distal pulse Pulses: 2+ and symmetric Skin: Skin color, texture, turgor normal. No rashes or lesions Neurologic: Grossly normal Psych: Mood, affect normal  EKG: NSR at 77, iRBBB  ASSESSMENT: 1. CAD s/p PCI to the mid-RCA and distal PDA with DES (03/2013) - will need ASA and Brillinta for 1 year 2. HTN - controlled 3. Hyperlipidemia - on zocor 4. Tobacco dependence - has quit with recent hospitalization  PLAN: 1.   Matthew Horton is doing well after his coronary intervention. I have recommended cardiac rehabilitation, but he is eager to get back to work and he is "very active" at work.  His radial cath site is healed. He did not have an MI and his LV function is preserved. 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  I will be in contact with his DOT physician. Plan for follow-up in 6 months.  Will check NMR with lipids and uptitrate or adjust cholesterol medication as necessary for maximum benefit.   Chrystie Nose, MD, [facility] Attending Cardiologist [facility]  Jearld Hemp C 04/03/2013, 3:09 PM

## 2013-04-03 NOTE — Telephone Encounter (Signed)
Returned patient's call - informed that Dr. Rennis Golden will be working on the work released form this afternoon - patient will be notified as soon as release is ready/faxed in. Patient requests this be faxed to Gwenyth Ober (manager at workplace) at [phone]. Patient verbalized understanding.

## 2013-04-03 NOTE — Telephone Encounter (Signed)
Please call-still waiting for his release to go back to work.

## 2013-04-07 ENCOUNTER — Ambulatory Visit: Payer: Managed Care, Other (non HMO) | Admitting: Cardiovascular Disease

## 2013-04-19 ENCOUNTER — Telehealth (HOSPITAL_COMMUNITY): Payer: Self-pay | Admitting: *Deleted

## 2013-04-19 DIAGNOSIS — I2510 Atherosclerotic heart disease of native coronary artery without angina pectoris: Secondary | ICD-10-CM

## 2013-04-19 NOTE — Telephone Encounter (Signed)
Dr Rennis Golden - please advise. Thanks

## 2013-04-19 NOTE — Telephone Encounter (Signed)
Pt would like to know if Dr. 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 He is about 5 months early before the physical is due but because he has met his deductible already for the year he would like to get the stress test done now.

## 2013-04-21 NOTE — Telephone Encounter (Signed)
Go ahead and order exercise nuclear stress. Insurance may not cover - he just had a stent.  Dr. Rennis Golden

## 2013-04-21 NOTE — Telephone Encounter (Signed)
Exercise nuclear stress test ordered per patient request/Dr. Rennis Golden approval. Message sent to scheduler to make appointment.

## 2013-04-27 ENCOUNTER — Encounter (HOSPITAL_COMMUNITY): Payer: Self-pay | Admitting: *Deleted

## 2013-05-17 ENCOUNTER — Ambulatory Visit (HOSPITAL_COMMUNITY)
Admission: RE | Admit: 2013-05-17 | Discharge: 2013-05-17 | Disposition: A | Discharge status: Home / Self Care | Payer: Managed Care, Other (non HMO) | Source: Ambulatory Visit | Attending: Internal Medicine | Admitting: Internal Medicine

## 2013-05-17 DIAGNOSIS — R0609 Other forms of dyspnea: Secondary | ICD-10-CM | POA: Insufficient documentation

## 2013-05-17 DIAGNOSIS — I2510 Atherosclerotic heart disease of native coronary artery without angina pectoris: Secondary | ICD-10-CM | POA: Insufficient documentation

## 2013-05-17 DIAGNOSIS — Z8249 Family history of ischemic heart disease and other diseases of the circulatory system: Secondary | ICD-10-CM | POA: Insufficient documentation

## 2013-05-17 DIAGNOSIS — I10 Essential (primary) hypertension: Secondary | ICD-10-CM | POA: Insufficient documentation

## 2013-05-17 DIAGNOSIS — E663 Overweight: Secondary | ICD-10-CM | POA: Insufficient documentation

## 2013-05-17 DIAGNOSIS — R079 Chest pain, unspecified: Secondary | ICD-10-CM | POA: Insufficient documentation

## 2013-05-17 DIAGNOSIS — Z87891 Personal history of nicotine dependence: Secondary | ICD-10-CM | POA: Insufficient documentation

## 2013-05-17 DIAGNOSIS — E119 Type 2 diabetes mellitus without complications: Secondary | ICD-10-CM | POA: Insufficient documentation

## 2013-05-17 DIAGNOSIS — R0989 Other specified symptoms and signs involving the circulatory and respiratory systems: Secondary | ICD-10-CM | POA: Insufficient documentation

## 2013-05-17 DIAGNOSIS — I4891 Unspecified atrial fibrillation: Secondary | ICD-10-CM | POA: Insufficient documentation

## 2013-05-17 DIAGNOSIS — Z9861 Coronary angioplasty status: Secondary | ICD-10-CM | POA: Insufficient documentation

## 2013-05-17 DIAGNOSIS — I4510 Unspecified right bundle-branch block: Secondary | ICD-10-CM | POA: Insufficient documentation

## 2013-05-17 MED ORDER — TECHNETIUM TC 99M SESTAMIBI GENERIC - CARDIOLITE
30.0000 | Freq: Once | INTRAVENOUS | Status: AC | PRN
  Administered 2013-05-17: 30 via INTRAVENOUS

## 2013-05-17 MED ORDER — TECHNETIUM TC 99M SESTAMIBI GENERIC - CARDIOLITE
10.0000 | Freq: Once | INTRAVENOUS | Status: AC | PRN
  Administered 2013-05-17: 10 via INTRAVENOUS

## 2013-05-17 NOTE — Procedures (Addendum)
[facility] [address] [place] [address] [place] [address] [phone]  Cardiology Nuclear Med Study  Matthew Horton is a 47 y.o. male     MRN : [id number]     DOB: 1965-08-06  Procedure Date: 05/17/2013  Nuclear Med Background Indication for Stress Test:  Evaluation for Ischemia History:  CAD;STENT/PTCA--03/22/2013;PAF Cardiac Risk Factors: Family History - CAD, History of Smoking, Hypertension, Lipids, NIDDM, Overweight and RBBB  Symptoms:  Chest Pain and DOE   Nuclear Pre-Procedure Caffeine/Decaff Intake:  9:00pm NPO After: 7:00am   IV Site: R Hand  IV 0.9% NS with Angio Cath:  22g  Chest Size (in):  56'  IV Started by: Emmit Pomfret, RN  Height: 5\' 10"  (1.778 m)  Cup Size: n/a  BMI:  Body mass index is 31.42 kg/(m^2). Weight:  219 lb (99.338 kg)   Tech Comments:  N/A    Nuclear Med Study 1 or 2 day study: 1 day  Stress Test Type:  Stress  Order Authorizing Provider:  Zoila Shutter, MD   Resting Radionuclide: Technetium 61m Sestamibi  Resting Radionuclide Dose: 10.2 mCi   Stress Radionuclide:  Technetium 51m Sestamibi  Stress Radionuclide Dose: 30.9 mCi           Stress Protocol Rest HR: 83 Stress HR: 148  Rest BP: 122/73 Stress BP: 108/81  Exercise Time (min): 8:37 METS: 10.1   Predicted Max HR: 173 bpm % Max HR: 85.55 bpm Rate Pressure Product: 46962  Dose of Adenosine (mg):  n/a Dose of Lexiscan: n/a mg  Dose of Atropine (mg): n/a Dose of Dobutamine: n/a mcg/kg/min (at max HR)  Stress Test Technologist: Esperanza Sheets, CCT Nuclear Technologist: Koren Shiver, CNMT   Rest Procedure:  Myocardial perfusion imaging was performed at rest 45 minutes following the intravenous administration of Technetium 58m Sestamibi. Stress Procedure:  The patient performed treadmill exercise using a Bruce  Protocol for 8:37 minutes. The patient stopped due to Marked SOB and denied any chest pain.  There were no significant ST-T  wave changes.  Technetium 41m Sestamibi was injected at peak exercise and myocardial perfusion imaging was performed after a brief delay.  Transient Ischemic Dilatation (Normal <1.22):  0.84 Lung/Heart Ratio (Normal <0.45):  0.33 QGS EDV:  83 ml QGS ESV:  35 ml LV Ejection Fraction: 57%     Rest ECG: NSR - Normal EKG  Stress ECG: No significant change from baseline ECG  QPS Raw Data Images:  Normal; no motion artifact; normal heart/lung ratio. Stress Images:  Mildly reduced inferior wall tracer uptake Rest Images:  Partial improvement in inferior wall uptake Subtraction (SDS):  These findings are consistent with ischemia. LV Wall Motion:  NL LV Function; NL Wall Motion  Impression Exercise Capacity:  Good exercise capacity. BP Response:  Hypotensive blood pressure response. Clinical Symptoms:  There is dyspnea. ECG Impression:  No significant ST segment change suggestive of ischemia. Comparison with Prior Nuclear Study: No previous nuclear study performed   Overall Impression:  Low risk stress nuclear study with a small area of inferior reversibility at the apex.   CROITORU,MIHAI, MD  05/17/2013 1:55 PM  I have personally reviewed the stress test. I feel that the area of reversible ischemia is small. He has had no chest pain. He recently had cardiac catheterization in 03/2013 and received stents to the RCA which are likely to be patent.   Chrystie Nose, MD, [facility] Board Certified in Nuclear Cardiology Attending Cardiologist [facility]

## 2013-05-19 ENCOUNTER — Encounter: Payer: Self-pay | Admitting: Internal Medicine

## 2013-05-19 ENCOUNTER — Ambulatory Visit (INDEPENDENT_AMBULATORY_CARE_PROVIDER_SITE_OTHER): Payer: Managed Care, Other (non HMO) | Admitting: Internal Medicine

## 2013-05-19 VITALS — BP 110/52 | HR 84 | Ht 70.0 in | Wt 217.0 lb

## 2013-05-19 DIAGNOSIS — I10 Essential (primary) hypertension: Secondary | ICD-10-CM

## 2013-05-19 DIAGNOSIS — E785 Hyperlipidemia, unspecified: Secondary | ICD-10-CM

## 2013-05-19 DIAGNOSIS — E119 Type 2 diabetes mellitus without complications: Secondary | ICD-10-CM

## 2013-05-19 DIAGNOSIS — F17200 Nicotine dependence, unspecified, uncomplicated: Secondary | ICD-10-CM

## 2013-05-19 DIAGNOSIS — Z720 Tobacco use: Secondary | ICD-10-CM

## 2013-05-19 DIAGNOSIS — R9439 Abnormal result of other cardiovascular function study: Secondary | ICD-10-CM

## 2013-05-19 DIAGNOSIS — I2510 Atherosclerotic heart disease of native coronary artery without angina pectoris: Secondary | ICD-10-CM

## 2013-05-19 MED ORDER — RANOLAZINE ER 1000 MG PO TB12: 500.0000 mg | ORAL_TABLET | Freq: Two times a day (BID) | ORAL | Status: DC

## 2013-05-19 NOTE — Patient Instructions (Signed)
Try Ranexa 1000mg  twice daily. Please call our office to let us know if it is working for you and if so, we can provide a prescription.   Your physician wants you to follow-up in: 6 months. You will receive a reminder letter in the mail two months in advance. If you don't receive a letter, please call our office to schedule the follow-up appointment.

## 2013-05-19 NOTE — Progress Notes (Signed)
OFFICE NOTE  Chief Complaint:  Hospital follow-up, marked improvement in dyspnea, no chest pain  Primary Care Physician: No PCP Per Patient  HPI:  Matthew Horton is a 47 y.o. male with PMH of HTN, DM-2, HLD, OSA & long-standing tobacco abuse with known moderate coronary disease by catheterization in March of 2014 for an abnormal stress test. At that time he shown to have moderate two-vessel disease in the diagonal branch of the LAD as well as the RCA. There was also an ostial OM1 lesion. An FFR of the RCA 70% lesion wasn't not significant. Medical therapy was recommended, The patient continued to have intermittent episodes of chest discomfort since that time. Despite being on aggressive therapy, he presented again on the morning of March 21, 2013 with substernal chest pain like a brick sitting on his chest. This was relieved by nitroglycerin. His EKG demonstrated no acute changes. He was admitted and ruled out for MI with negative troponins x 3. However, considering his history and high likelihood for progression of disease, a relook cath was recommended. This was performed by Dr. Herbie Baltimore, via the right radial artery. He was found to have severe 2 site CAD in the mid RCA and mid PDA, demonstrating significant progression of disease since March of 2014. Both lesions were treated with DES. He had normal LVEF and EDP. He tolerated the procedure well but was noted to have post PCI atrial fibrillation with RVR. He left the cath lab in stable condition. He had no further A-fib on telemetry. He remained CP free. The right radial access site remained stable. His labs were stable. He did have mild hypertension and was started on a low dose ACE-I. He was resumed on his BB and statin. He was placed on DAPT with ASA and Brilinta.  He returns today in follow-up and is feeling well. His radial cath site has a good pulse and there is good distal sensation and perfusion. He has had no further chest pain and no  significant DOE.  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  He reports that he has stopped smoking with this recent hospitalization.  Matthew Horton returns today for followup of his recent stress test which was ordered as part of his DOT physical. He has been asymptomatic for the most part since he had stents placed in the right coronary artery and PDA in October. He occasionally gets some small twinges in his chest which are similar to his symptoms prior to the cath, however his pain prior to stenting was described as a pain like a brick sitting on his chest. He is stress test was initially interpreted as high risk do to a hypotensive response to exercise, but he did not describe any chest pain. He is nuclear perfusion images do suggest a small amount of reversible ischemia toward the inferior apex. I would not consider this a high risk study.  PMHx:  Past Medical History  Diagnosis Date  . HTN (hypertension)   . DM2 (diabetes mellitus, type 2)   . CKD (chronic kidney disease) stage 2, GFR 60-89 ml/min   . HLD (hyperlipidemia)   . Tobacco abuse   . Cluster headache   . OSA (obstructive sleep apnea)   . Low testosterone   . Chest tightness   . Nonallopathic lesion of cervical region   . Fatigue   . Shortness of breath   . Torticollis, acute   .  Coronary artery disease     Past Surgical History  Procedure Laterality Date  . Tonsillectomy    . Traumatic amputation and reattachment Left     Index finger  . Coronary angioplasty with stent placement  03/22/2013    Dr. Ranae Palms    FAMHx:  Family History  Problem Relation Age of Onset  . Coronary artery disease Father 53    MI and s/p CABG  . Diabetes Father   . Hypertension Father   . Hyperlipidemia Father   . Hyperlipidemia Mother     SOCHx:   reports that he quit smoking about 8 weeks ago. His smoking use included Cigarettes. He has a 30 pack-year smoking history. He does not  have any smokeless tobacco history on file. He reports that he does not drink alcohol or use illicit drugs.  ALLERGIES:  No Known Allergies  ROS: A comprehensive review of systems was negative.  HOME MEDS: Current Outpatient Prescriptions  Medication Sig Dispense Refill  . aspirin EC 81 MG tablet Take 81 mg by mouth every morning.       Marland Kitchen lisinopril (PRINIVIL,ZESTRIL) 5 MG tablet Take 1 tablet (5 mg total) by mouth daily.  30 tablet  5  . metFORMIN (GLUCOPHAGE) 500 MG tablet Take 500 mg by mouth every evening.       . metoprolol (LOPRESSOR) 50 MG tablet Take 50 mg by mouth 2 (two) times daily.      . nitroGLYCERIN (NITROSTAT) 0.4 MG SL tablet Place 1 tablet (0.4 mg total) under the tongue every 5 (five) minutes as needed for chest pain.  25 tablet  2  . simvastatin (ZOCOR) 20 MG tablet Take 40 mg by mouth every evening.       . Ticagrelor (BRILINTA) 90 MG TABS tablet Take 1 tablet (90 mg total) by mouth 2 (two) times daily.  60 tablet  10  . ranolazine (RANEXA) 1000 MG SR tablet Take 1 tablet (1,000 mg total) by mouth 2 (two) times daily.  14 tablet  0   No current facility-administered medications for this visit.    LABS/IMAGING: No results found for this or any previous visit (from the past 48 hour(s)). No results found.  VITALS: BP 110/52  Pulse 84  Ht 5\' 10"  (1.778 m)  Wt 217 lb (98.431 kg)  BMI 31.14 kg/m2  EXAM: deferred  EKG: deferred  ASSESSMENT: 1. CAD s/p PCI to the mid-RCA and distal PDA with DES (03/2013) - will need ASA and Brillinta for 1 year 2. HTN - controlled 3. Hyperlipidemia - on zocor 4. Tobacco dependence - has quit with recent hospitalization 5. Abnormal NST  PLAN: 1.   Matthew Horton is doing well after his coronary intervention. 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 He is stress test was performed per DOT guidelines although not indicated due to his recent stenting. Nevertheless, a conundrum has risen as the stress test  was mildly abnormal. He reports few if any symptoms and nothing like before his recent stents. I do believe his stress test was low risk although he did have a mild hypertensive response to exercise. This is generally seen in severe multivessel disease or left main disease, however there was clearly no evidence of this on his recent catheterization 2 months ago.  He could feasibly have be having small vessel ischemia. He will 80 continue on aspirin and Brillinta. I would recommend a two-week trial on Ranexa 1000 mg twice daily. If  he does have some benefit from this medication, we can continue it. I feel that he could safely go back to his job without delay.  Please don't hesitate to contact me if you have any questions.  Chrystie Nose, MD, [facility] Attending Cardiologist [facility]  HILTY,Kenneth C 05/19/2013, 4:58 PM

## 2013-06-06 ENCOUNTER — Telehealth: Payer: Self-pay | Admitting: Internal Medicine

## 2013-06-06 NOTE — Telephone Encounter (Signed)
Spoke to patient . Patient states he used RANEXA for 7 days. He states he felt bad during that time (lightheadness). Patient states he stopped medication about 2-3 days ago."I feeling better than I felt in a while "states patient.  Patient is aware Dr Rennis Golden is out of office will review and someone will contact him net week

## 2013-06-06 NOTE — Telephone Encounter (Signed)
S aid Dr Rennis Golden put him on new med (didnt know name of) and Dr Rennis Golden wanted him to call back to see how it was working  He doesn't think he can take it.  Is making him feel extremely bad.  He quit taking and feeling better  Wants to know what Dr Rennis Golden wants him to do.  If doesn't get answer today please call tomorrow early afternoon . Also his My Chart patient number has expired before he could set up  Wants to get another code to set up  Please call

## 2013-06-13 NOTE — Telephone Encounter (Signed)
Left VM with info that Dr. Rennis Golden reviewed note that he stopped ranexa and to call with any recurring CP/issues

## 2013-06-13 NOTE — Telephone Encounter (Signed)
Thanks .Marland Kitchen I'll make note that he stopped Ranexa due to side-effects.  Dr. Rennis Golden

## 2013-07-31 ENCOUNTER — Telehealth: Payer: Self-pay | Admitting: Nurse Practitioner

## 2013-07-31 NOTE — Telephone Encounter (Signed)
Phone call today from patient's wife.  Reports headache since Saturday and palpitations over the last month. States BP is 120/80. Some bloody nasal drainage. No neurologic problems.  I advised going to Urgent Care for evaluation. She was worried that the headache indicated "heart blockage". He has had a prior chest pain syndrome and has no active chest pain at this time.   Patient's wife was agreeable to this plan and will call if any problems develop in the interim.    Rosalio MacadamiaLori C. Aman Batley, RN, ANP-C [facility] [address] [place], [place]  [id number] [phone]

## 2013-08-07 ENCOUNTER — Encounter (HOSPITAL_COMMUNITY): Payer: Self-pay | Admitting: Emergency Medicine

## 2013-08-07 ENCOUNTER — Emergency Department (HOSPITAL_COMMUNITY): Payer: Managed Care, Other (non HMO)

## 2013-08-07 ENCOUNTER — Inpatient Hospital Stay (HOSPITAL_COMMUNITY)
Admission: EM | Admit: 2013-08-07 | Discharge: 2013-08-09 | DRG: 287 | Disposition: A | Discharge status: Home / Self Care | Payer: Managed Care, Other (non HMO) | Attending: Internal Medicine | Admitting: Internal Medicine

## 2013-08-07 DIAGNOSIS — I2510 Atherosclerotic heart disease of native coronary artery without angina pectoris: Secondary | ICD-10-CM

## 2013-08-07 DIAGNOSIS — Z8249 Family history of ischemic heart disease and other diseases of the circulatory system: Secondary | ICD-10-CM

## 2013-08-07 DIAGNOSIS — I4891 Unspecified atrial fibrillation: Secondary | ICD-10-CM

## 2013-08-07 DIAGNOSIS — G4733 Obstructive sleep apnea (adult) (pediatric): Secondary | ICD-10-CM | POA: Diagnosis present

## 2013-08-07 DIAGNOSIS — N182 Chronic kidney disease, stage 2 (mild): Secondary | ICD-10-CM

## 2013-08-07 DIAGNOSIS — Z9861 Coronary angioplasty status: Secondary | ICD-10-CM

## 2013-08-07 DIAGNOSIS — Z7982 Long term (current) use of aspirin: Secondary | ICD-10-CM

## 2013-08-07 DIAGNOSIS — R0789 Other chest pain: Principal | ICD-10-CM | POA: Diagnosis present

## 2013-08-07 DIAGNOSIS — E119 Type 2 diabetes mellitus without complications: Secondary | ICD-10-CM

## 2013-08-07 DIAGNOSIS — I10 Essential (primary) hypertension: Secondary | ICD-10-CM

## 2013-08-07 DIAGNOSIS — I129 Hypertensive chronic kidney disease with stage 1 through stage 4 chronic kidney disease, or unspecified chronic kidney disease: Secondary | ICD-10-CM | POA: Diagnosis present

## 2013-08-07 DIAGNOSIS — E785 Hyperlipidemia, unspecified: Secondary | ICD-10-CM

## 2013-08-07 DIAGNOSIS — R079 Chest pain, unspecified: Secondary | ICD-10-CM

## 2013-08-07 DIAGNOSIS — F17200 Nicotine dependence, unspecified, uncomplicated: Secondary | ICD-10-CM | POA: Diagnosis present

## 2013-08-07 DIAGNOSIS — Z79899 Other long term (current) drug therapy: Secondary | ICD-10-CM

## 2013-08-07 DIAGNOSIS — I200 Unstable angina: Secondary | ICD-10-CM

## 2013-08-07 DIAGNOSIS — Z720 Tobacco use: Secondary | ICD-10-CM | POA: Diagnosis present

## 2013-08-07 HISTORY — DX: Unspecified atrial fibrillation: I48.91

## 2013-08-07 LAB — CBC
HCT: 42.2 % (ref 39.0–52.0)
Hemoglobin: 14.9 g/dL (ref 13.0–17.0)
MCH: 31.7 pg (ref 26.0–34.0)
MCHC: 35.3 g/dL (ref 30.0–36.0)
MCV: 89.8 fL (ref 78.0–100.0)
PLATELETS: 259 10*3/uL (ref 150–400)
RBC: 4.7 MIL/uL (ref 4.22–5.81)
RDW: 12.5 % (ref 11.5–15.5)
WBC: 8.8 10*3/uL (ref 4.0–10.5)

## 2013-08-07 LAB — BASIC METABOLIC PANEL
BUN: 13 mg/dL (ref 6–23)
CALCIUM: 9.7 mg/dL (ref 8.4–10.5)
CO2: 30 mEq/L (ref 19–32)
Chloride: 102 mEq/L (ref 96–112)
Creatinine, Ser: 1 mg/dL (ref 0.50–1.35)
GFR, EST NON AFRICAN AMERICAN: 88 mL/min — AB (ref >90)
GLUCOSE: 97 mg/dL (ref 70–99)
Potassium: 4 mEq/L (ref 3.7–5.3)
SODIUM: 142 meq/L (ref 137–147)

## 2013-08-07 LAB — TROPONIN I: Troponin I: <0.3 ng/mL (ref <0.30)

## 2013-08-07 LAB — I-STAT TROPONIN, ED
TROPONIN I, POC: 0 ng/mL (ref 0.00–0.08)
Troponin i, poc: 0 ng/mL (ref 0.00–0.08)

## 2013-08-07 LAB — PROTIME-INR
INR: 1.02 (ref 0.00–1.49)
Prothrombin Time: 13.2 seconds (ref 11.6–15.2)

## 2013-08-07 MED ORDER — ASPIRIN EC 81 MG PO TBEC: 81.0000 mg | DELAYED_RELEASE_TABLET | Freq: Every morning | ORAL | Status: DC

## 2013-08-07 MED ORDER — SODIUM CHLORIDE 0.9 % IV SOLN
INTRAVENOUS | Status: DC
  Administered 2013-08-08: 04:00:00 via INTRAVENOUS

## 2013-08-07 MED ORDER — ASPIRIN 81 MG PO CHEW
81.0000 mg | CHEWABLE_TABLET | ORAL | Status: AC
  Administered 2013-08-08: 81 mg via ORAL
  Filled 2013-08-07: qty 1

## 2013-08-07 MED ORDER — DIAZEPAM 5 MG PO TABS
5.0000 mg | ORAL_TABLET | ORAL | Status: AC
  Administered 2013-08-08: 5 mg via ORAL
  Filled 2013-08-07: qty 1

## 2013-08-07 MED ORDER — ASPIRIN EC 81 MG PO TBEC: 81.0000 mg | DELAYED_RELEASE_TABLET | Freq: Every day | ORAL | Status: DC

## 2013-08-07 MED ORDER — ASPIRIN 81 MG PO CHEW
324.0000 mg | CHEWABLE_TABLET | Freq: Once | ORAL | Status: AC
  Administered 2013-08-07: 324 mg via ORAL
  Filled 2013-08-07: qty 4

## 2013-08-07 MED ORDER — TICAGRELOR 90 MG PO TABS
90.0000 mg | ORAL_TABLET | Freq: Two times a day (BID) | ORAL | Status: DC
  Administered 2013-08-07 – 2013-08-08 (×2): 90 mg via ORAL
  Filled 2013-08-07 (×3): qty 1

## 2013-08-07 MED ORDER — SODIUM CHLORIDE 0.9 % IJ SOLN
3.0000 mL | Freq: Two times a day (BID) | INTRAMUSCULAR | Status: DC
  Administered 2013-08-08: 3 mL via INTRAVENOUS

## 2013-08-07 MED ORDER — SODIUM CHLORIDE 0.9 % IV SOLN: 250.0000 mL | INTRAVENOUS | Status: DC | PRN

## 2013-08-07 MED ORDER — ACETAMINOPHEN 325 MG PO TABS
650.0000 mg | ORAL_TABLET | Freq: Once | ORAL | Status: AC
  Administered 2013-08-07: 650 mg via ORAL
  Filled 2013-08-07: qty 2

## 2013-08-07 MED ORDER — ASPIRIN 81 MG PO CHEW: 81.0000 mg | CHEWABLE_TABLET | ORAL | Status: DC

## 2013-08-07 MED ORDER — HEPARIN (PORCINE) IN NACL 100-0.45 UNIT/ML-% IJ SOLN
1350.0000 [IU]/h | INTRAMUSCULAR | Status: DC
  Administered 2013-08-07 – 2013-08-08 (×2): 1350 [IU]/h via INTRAVENOUS
  Filled 2013-08-07 (×3): qty 250

## 2013-08-07 MED ORDER — METOPROLOL TARTRATE 50 MG PO TABS
50.0000 mg | ORAL_TABLET | Freq: Two times a day (BID) | ORAL | Status: DC
  Administered 2013-08-07 – 2013-08-08 (×2): 50 mg via ORAL
  Filled 2013-08-07 (×3): qty 1

## 2013-08-07 MED ORDER — SIMVASTATIN 40 MG PO TABS
40.0000 mg | ORAL_TABLET | Freq: Every day | ORAL | Status: DC
  Administered 2013-08-07: 40 mg via ORAL
  Filled 2013-08-07 (×2): qty 1

## 2013-08-07 MED ORDER — SODIUM CHLORIDE 0.9 % IJ SOLN: 3.0000 mL | INTRAMUSCULAR | Status: DC | PRN

## 2013-08-07 MED ORDER — LISINOPRIL 5 MG PO TABS
5.0000 mg | ORAL_TABLET | Freq: Every day | ORAL | Status: DC
  Administered 2013-08-07 – 2013-08-08 (×2): 5 mg via ORAL
  Filled 2013-08-07 (×2): qty 1

## 2013-08-07 MED ORDER — HEPARIN BOLUS VIA INFUSION
4000.0000 [IU] | Freq: Once | INTRAVENOUS | Status: AC
  Administered 2013-08-07: 4000 [IU] via INTRAVENOUS
  Filled 2013-08-07: qty 4000

## 2013-08-07 MED ORDER — NITROGLYCERIN 0.4 MG SL SUBL: 0.4000 mg | SUBLINGUAL_TABLET | SUBLINGUAL | Status: DC | PRN

## 2013-08-07 MED ORDER — NITROGLYCERIN 2 % TD OINT
1.0000 [in_us] | TOPICAL_OINTMENT | Freq: Once | TRANSDERMAL | Status: AC
  Administered 2013-08-07: 1 [in_us] via TOPICAL
  Filled 2013-08-07: qty 1

## 2013-08-07 NOTE — ED Provider Notes (Signed)
CSN: [id number]     Arrival date & time 08/07/13  1334 History  First MD Initiated Contact with Patient 08/07/13 1512     Chief Complaint  Patient presents with  . Chest Pain    HPI Comments: Pt has history of CAD with stents placed in October of 2014.  He has been having pain that feels like his chest pain prior to the stents.  Dr Rennis GoldenHilty was the cardiologist.  Patient is a 48 y.o. male presenting with chest pain. The history is provided by the patient.  Chest Pain Pain location:  Substernal area Pain quality: sharp   Pain radiates to:  Does not radiate Pain severity:  Moderate Onset quality:  Sudden Duration:  3 weeks Timing: seconds at a time. Progression:  Worsening Relieved by:  Nothing Exacerbated by: nothing in particular occurs at rest and exertion. Associated symptoms: nausea and shortness of breath   Associated symptoms: not vomiting     Past Medical History  Diagnosis Date  . HTN (hypertension)   . DM2 (diabetes mellitus, type 2)   . CKD (chronic kidney disease) stage 2, GFR 60-89 ml/min   . HLD (hyperlipidemia)   . Tobacco abuse   . Cluster headache   . OSA (obstructive sleep apnea)   . Low testosterone   . Chest tightness   . Nonallopathic lesion of cervical region   . Fatigue   . Shortness of breath   . Torticollis, acute   . Coronary artery disease    Past Surgical History  Procedure Laterality Date  . Tonsillectomy    . Traumatic amputation and reattachment Left     Index finger  . Coronary angioplasty with stent placement  03/22/2013    Dr. Ranae Palms. [place]   Family History  Problem Relation Age of Onset  . Coronary artery disease Father 6350    MI and s/p CABG  . Diabetes Father   . Hypertension Father   . Hyperlipidemia Father   . Hyperlipidemia Mother    History  Substance Use Topics  . Smoking status: Former Smoker -- 1.00 packs/day for 30 years    Types: Cigarettes    Quit date: 05/22/2013  . Smokeless tobacco: Not on file  . Alcohol Use: No     Review of Systems  Respiratory: Positive for shortness of breath.   Cardiovascular: Positive for chest pain.  Gastrointestinal: Positive for nausea. Negative for vomiting.  All other systems reviewed and are negative.      Allergies  Ranexa  Home Medications   No current outpatient prescriptions on file. BP 115/65  Pulse 76  Temp(Src) 98 F (36.7 C) (Oral)  Resp 11  Ht 5\' 10"  (1.778 m)  Wt 215 lb 6.2 oz (97.7 kg)  BMI 30.91 kg/m2  SpO2 97% Physical Exam  Nursing note and vitals reviewed. Constitutional: He appears well-developed and well-nourished. No distress.  HENT:  Head: Normocephalic and atraumatic.  Right Ear: External ear normal.  Left Ear: External ear normal.  Eyes: Conjunctivae are normal. Right eye exhibits no discharge. Left eye exhibits no discharge. No scleral icterus.  Neck: Neck supple. No tracheal deviation present.  Cardiovascular: Normal rate, regular rhythm and intact distal pulses.   Pulmonary/Chest: Effort normal and breath sounds normal. No stridor. No respiratory distress. He has no wheezes. He has no rales.  Abdominal: Soft. Bowel sounds are normal. He exhibits no distension. There is no tenderness. There is no rebound and no guarding.  Musculoskeletal: He exhibits no edema and no  tenderness.  Neurological: He is alert. He has normal strength. No cranial nerve deficit (no facial droop, extraocular movements intact, no slurred speech) or sensory deficit. He exhibits normal muscle tone. He displays no seizure activity. Coordination normal.  Skin: Skin is warm and dry. No rash noted.  Psychiatric: He has a normal mood and affect.    ED Course  Procedures (including critical care time) Labs Review Labs Reviewed  BASIC METABOLIC PANEL - Abnormal; Notable for the following:    GFR calc non Af Amer 88 (*)    All other components within normal limits  CBC  TROPONIN I  PROTIME-INR  TROPONIN I  TROPONIN I  BASIC METABOLIC PANEL  HEPARIN  LEVEL (UNFRACTIONATED)  LIPID PANEL  CBC  I-STAT TROPOININ, ED  Rosezena Sensor, ED   Imaging Review Dg Chest 2 View  08/07/2013   CLINICAL DATA:  CHEST PAIN  EXAM: CHEST  2 VIEW  COMPARISON:  DG CHEST 2 VIEW dated 03/21/2013  FINDINGS: The heart size and mediastinal contours are within normal limits. Both lungs are clear. The visualized skeletal structures are unremarkable.  IMPRESSION: No active cardiopulmonary disease.   Electronically Signed   By: Salome Holmes M.D.   On: 08/07/2013 14:11     Medications  simvastatin (ZOCOR) tablet 40 mg (40 mg Oral Given 08/07/13 2307)  lisinopril (PRINIVIL,ZESTRIL) tablet 5 mg (5 mg Oral Given 08/07/13 2307)  nitroGLYCERIN (NITROSTAT) SL tablet 0.4 mg (not administered)  Ticagrelor (BRILINTA) tablet 90 mg (90 mg Oral Given 08/07/13 2304)  metoprolol (LOPRESSOR) tablet 50 mg (50 mg Oral Given 08/07/13 2305)  sodium chloride 0.9 % injection 3 mL (3 mLs Intravenous Not Given 08/07/13 2200)  sodium chloride 0.9 % injection 3 mL (not administered)  0.9 %  sodium chloride infusion (not administered)  0.9 %  sodium chloride infusion (not administered)  diazepam (VALIUM) tablet 5 mg (not administered)  aspirin EC tablet 81 mg (not administered)  aspirin chewable tablet 81 mg (not administered)  heparin ADULT infusion 100 units/mL (25000 units/250 mL) (1,350 Units/hr Intravenous New Bag/Given 08/07/13 2254)  aspirin chewable tablet 324 mg (324 mg Oral Given 08/07/13 1531)  nitroGLYCERIN (NITROGLYN) 2 % ointment 1 inch (1 inch Topical Given 08/07/13 1531)  acetaminophen (TYLENOL) tablet 650 mg (650 mg Oral Given 08/07/13 1621)  heparin bolus via infusion 4,000 Units (4,000 Units Intravenous Given 08/07/13 2255)      MDM   Final diagnoses:  Chest pain  CAD (coronary artery disease)    Pt is having recurrent chest pain that is atypical for angina in the description however patient states this is the same pain that he had prior to his cardiac stents.  I will  consult with cardiology to discuss further evaluation.    They will evaluate the patient in the ED    Celene Kras, MD 08/07/13 2359

## 2013-08-07 NOTE — ED Notes (Signed)
Report called to floor RN

## 2013-08-07 NOTE — Progress Notes (Signed)
ANTICOAGULATION CONSULT NOTE - Initial Consult  Pharmacy Consult for Heparin Indication: chest pain/ACS  Allergies  Allergen Reactions  . Ranexa [Ranolazine] Nausea Only and Other (See Comments)    dizziness    Patient Measurements: Height: 5\' 10"  (177.8 cm) Weight: 220 lb 1 oz (99.82 kg) IBW/kg (Calculated) : 73  Vital Signs: Temp: 98 F (36.7 C) (02/23 1341) Temp src: Oral (02/23 1341) BP: 117/72 mmHg (02/23 1815) Pulse Rate: 86 (02/23 1815)  Labs:  Recent Labs  08/07/13 1354  HGB 14.9  HCT 42.2  PLT 259  CREATININE 1.00   Estimated Creatinine Clearance: 108.1 ml/min (by C-G formula based on Cr of 1).  Medical History: Past Medical History  Diagnosis Date  . HTN (hypertension)   . DM2 (diabetes mellitus, type 2)   . CKD (chronic kidney disease) stage 2, GFR 60-89 ml/min   . HLD (hyperlipidemia)   . Tobacco abuse   . Cluster headache   . OSA (obstructive sleep apnea)   . Low testosterone   . Chest tightness   . Nonallopathic lesion of cervical region   . Fatigue   . Shortness of breath   . Torticollis, acute   . Coronary artery disease    Medications:  Prescriptions prior to admission  Medication Sig Dispense Refill  . aspirin EC 81 MG tablet Take 81 mg by mouth every morning.       Marland Kitchen. lisinopril (PRINIVIL,ZESTRIL) 5 MG tablet Take 1 tablet (5 mg total) by mouth daily.  30 tablet  5  . metFORMIN (GLUCOPHAGE) 500 MG tablet Take 500 mg by mouth daily with breakfast.       . metoprolol (LOPRESSOR) 50 MG tablet Take 50 mg by mouth 2 (two) times daily.      . nitroGLYCERIN (NITROSTAT) 0.4 MG SL tablet Place 1 tablet (0.4 mg total) under the tongue every 5 (five) minutes as needed for chest pain.  25 tablet  2  . simvastatin (ZOCOR) 40 MG tablet Take 40 mg by mouth daily.      . Ticagrelor (BRILINTA) 90 MG TABS tablet Take 1 tablet (90 mg total) by mouth 2 (two) times daily.  60 tablet  10    Assessment: 48 yo male admitted with c/o chest pain and we have  been asked to start IV heparin for him.  He is known to pharmacy from past heparin dosing and we will initiate therapy at previous rate since he was therapeutic at that time.  His CBC is stable as is his platelets.  No noted bleeding complications.  Goal of Therapy:  Heparin level 0.3-0.7 units/ml Monitor platelets by anticoagulation protocol: Yes   Plan:  1.  Begin IV heparin with 4000 units bolus x 1 2.  Start infusion of IV heparin at 1350 units/hr 3.  Obtain a heparin level 6 hours after starting therapy 4.  Daily heparin level and CBC  Nadara MustardNita Baeleigh Devincent, PharmD., MS Clinical Pharmacist Pager:  [phone] Thank you for allowing pharmacy to be part of this patients care team. 08/07/2013,9:05 PM

## 2013-08-07 NOTE — ED Notes (Signed)
Report called to Pod C. Pt transferred to room 29.

## 2013-08-07 NOTE — H&P (Addendum)
Patient ID: Karen KitchensStephen M Genna MRN: [id number] DOB/AGE: April 01, 1966 48 y.o. Admit date: 08/07/2013  Primary Cardiologist: Hilty  HPI: 48 y.o. male with history of CAD s/p PCI of the RCA, HTN, DM, HLD, OSA, CKD, paroxysmal atrial fib & long-standing tobacco abuse who is admitted with chest pain. Last cath October 2014 with 2 DES placed in the RCA. Also known to have moderate disease in the LAD and diagonal. FFR of the LAD and large diagonal branch was over 0.90 so no intervention performed.  He has done well since then and had a stress myoview 05/19/13 that was felt to be low risk. Now presenting to ED with chest pain similar to his prior angina. This has been occurring multiple times per day over last 2 weeks. The pain is sharp in nature, substernal without radiation. Also has a sensation of heaviness in his chest. No associated SOB or diaphoresis. Troponin negative x 2. EKG without ischemic changes. No active chest pain at this time.   Review of systems complete and found to be negative unless listed above   Past Medical History  Diagnosis Date  . HTN (hypertension)   . DM2 (diabetes mellitus, type 2)   . CKD (chronic kidney disease) stage 2, GFR 60-89 ml/min   . HLD (hyperlipidemia)   . Tobacco abuse   . Cluster headache   . OSA (obstructive sleep apnea)   . Low testosterone   . Chest tightness   . Nonallopathic lesion of cervical region   . Fatigue   . Shortness of breath   . Torticollis, acute   . Coronary artery disease     Family History  Problem Relation Age of Onset  . Coronary artery disease Father 5950    MI and s/p CABG  . Diabetes Father   . Hypertension Father   . Hyperlipidemia Father   . Hyperlipidemia Mother     History   Social History  . Marital Status: Married    Spouse Name: N/A    Number of Children: N/A  . Years of Education: N/A   Occupational History  . Not on file.   Social History Main Topics  . Smoking status: Current Some Day Smoker --  1.00 packs/day for 30 years    Types: Cigarettes    Last Attempt to Quit: 03/22/2013  . Smokeless tobacco: Not on file  . Alcohol Use: No  . Drug Use: No  . Sexual Activity: Not on file   Other Topics Concern  . Not on file   Social History Narrative   Drive a truck for a living.    Past Surgical History  Procedure Laterality Date  . Tonsillectomy    . Traumatic amputation and reattachment Left     Index finger  . Coronary angioplasty with stent placement  03/22/2013    Dr. Ranae Palms. [place]    Allergies  Allergen Reactions  . Ranexa [Ranolazine] Nausea Only and Other (See Comments)    dizziness    Prior to Admission Meds:  (Not in a hospital admission)  Physical Exam: Blood pressure 95/65, pulse 78, temperature 98 F (36.7 C), temperature source Oral, resp. rate 15, height 5\' 10"  (1.778 m), weight 220 lb 1 oz (99.82 kg), SpO2 96.00%.    General: Well developed, well nourished, NAD  HEENT: OP clear, mucus membranes moist  SKIN: warm, dry. No rashes.  Neuro: No focal deficits  Musculoskeletal: Muscle strength 5/5 all ext  Psychiatric: Mood and affect normal  Neck: No  JVD, no carotid bruits, no thyromegaly, no lymphadenopathy.  Lungs:Clear bilaterally, no wheezes, rhonci, crackles  Cardiovascular: Regular rate and rhythm. No murmurs, gallops or rubs.  Abdomen:Soft. Bowel sounds present. Non-tender.  Extremities: No lower extremity edema. Pulses are 2 + in the bilateral DP/PT.   Labs:   Lab Results  Component Value Date   WBC 8.8 08/07/2013   HGB 14.9 08/07/2013   HCT 42.2 08/07/2013   MCV 89.8 08/07/2013   PLT 259 08/07/2013    Recent Labs Lab 08/07/13 1354  NA 142  K 4.0  CL 102  CO2 30  BUN 13  CREATININE 1.00  CALCIUM 9.7  GLUCOSE 97   Cardiac cath October 2014: Left Ventriculography:  EF: 55-60 %  Wall Motion: Normal Coronary Anatomy:  Left Main: Large-caliber vessel, bifurcates distally into the LAD, and Circumflex. Angiographically normal with  possible 10% distal taper. LAD: Large-caliber vessel with a very proximal first diagonal branch. After that branch point there is a roughly 50-60% stenosis before the vessel normalizes after giving rise to a large septal perforator. The vessel then courses down to taper around the apex. The remainder the vessel has minimal luminal irregularities.  D1: Large-caliber major diagonal branch that is at least a same size as the LAD. There is a proximal 60-70% stenosis that involves a small branch with ostial 90% stenosis. Unfortunately this vessel is not large enough to be intervened upon. Left Circumflex: Large caliber vessel with a smooth 40% proximal stenosis as it courses into the AV groove. At this point gives off a moderate caliber OM1 that has an ostial 50-60% stenosis. The circumflex then courses into the AV groove and bifurcates distally into OM 2 and L. PL 1. The remainder the system is mildly tortuous with mild luminal irregularities. LPL 1 has a focal 20-30% lesion.  RCA: Large-caliber dominant vessel is quite tortuous. Just prior to the second band there is a focal 70-80% lesion with tapering up to that. This does appear to be somewhat progressed since March. The vessel then has a high bifurcation into a small posterior lateral system and a long continuation of the Right Posterior. Ascending Artery.  RPDA: Is ischial long moderate to large caliber vessel that has a major branch in the mid segments. At this branch point there is a 70-80% stenosis that appears to be progressed from March.  RPL Sysytem:The RPAV in RCA moderate caliber vessel that gives rise to the AV nodal artery and terminates as 2 small posterior lateral branches. This actually is a small branch of what is below the terminal RCA and PDA.   Radiology: Chest x-ray:  The heart size and mediastinal contours are within normal limits.  Both lungs are clear. The visualized skeletal structures are  unremarkable.  IMPRESSION:  No active  cardiopulmonary disease.   EKG: Sinus, incomplete RBBB. No ischemic changes.   ASSESSMENT AND PLAN:   1. CAD/Unstable angina: Pt with known CAD with moderately severe disease in the LAD and diagonal by cath in 2014 and DES placed x 2 in the RCA. His symptoms are suggestive of unstable angina. Will admit to telemetry unit, cycle cardiac markers, plan cardiac cath tomorrow afternoon. Clear liquid breakfast then NPO. Continue home meds.   2. HTN: Continue home meds  3. Hyperlipidemia: Continue statin.   4. DM: Hold metformin pre-cath.   5. Chronic kidney disease: Follow renal function   6. PAF: sinus today.   Earney Hamburg, MD 08/07/2013, 5:55 PM

## 2013-08-07 NOTE — ED Notes (Signed)
Pt reports cp since this morning sharp, central CP. No radiation, c/o SOB and back pain. Had stents placed in October, reports this is how he felt last October. Took 1-81mg  aspirin. Skin is warm and dry. Has had nausea this morning.

## 2013-08-08 ENCOUNTER — Encounter (HOSPITAL_COMMUNITY)
Admission: EM | Disposition: A | Discharge status: Home / Self Care | Payer: Managed Care, Other (non HMO) | Source: Home / Self Care | Attending: Internal Medicine

## 2013-08-08 ENCOUNTER — Encounter (HOSPITAL_COMMUNITY): Payer: Self-pay | Admitting: General Practice

## 2013-08-08 DIAGNOSIS — I2510 Atherosclerotic heart disease of native coronary artery without angina pectoris: Secondary | ICD-10-CM

## 2013-08-08 HISTORY — PX: CARDIAC CATHETERIZATION: SHX172

## 2013-08-08 HISTORY — PX: LEFT HEART CATHETERIZATION WITH CORONARY ANGIOGRAM: SHX5451

## 2013-08-08 LAB — CBC
HCT: 41.3 % (ref 39.0–52.0)
Hemoglobin: 14.6 g/dL (ref 13.0–17.0)
MCH: 31.7 pg (ref 26.0–34.0)
MCHC: 35.4 g/dL (ref 30.0–36.0)
MCV: 89.8 fL (ref 78.0–100.0)
Platelets: 241 10*3/uL (ref 150–400)
RBC: 4.6 MIL/uL (ref 4.22–5.81)
RDW: 12.8 % (ref 11.5–15.5)
WBC: 9.1 10*3/uL (ref 4.0–10.5)

## 2013-08-08 LAB — BASIC METABOLIC PANEL
BUN: 15 mg/dL (ref 6–23)
CHLORIDE: 103 meq/L (ref 96–112)
CO2: 27 meq/L (ref 19–32)
Calcium: 9.2 mg/dL (ref 8.4–10.5)
Creatinine, Ser: 1.17 mg/dL (ref 0.50–1.35)
GFR calc Af Amer: 84 mL/min — ABNORMAL LOW (ref >90)
GFR calc non Af Amer: 73 mL/min — ABNORMAL LOW (ref >90)
GLUCOSE: 114 mg/dL — AB (ref 70–99)
POTASSIUM: 4.1 meq/L (ref 3.7–5.3)
Sodium: 143 mEq/L (ref 137–147)

## 2013-08-08 LAB — HEPARIN LEVEL (UNFRACTIONATED): Heparin Unfractionated: 0.42 IU/mL (ref 0.30–0.70)

## 2013-08-08 LAB — TROPONIN I
Troponin I: <0.3 ng/mL (ref <0.30)
Troponin I: <0.3 ng/mL (ref <0.30)

## 2013-08-08 LAB — LIPID PANEL
CHOL/HDL RATIO: 4.6 ratio
CHOLESTEROL: 167 mg/dL (ref 0–200)
HDL: 36 mg/dL — ABNORMAL LOW (ref >39)
LDL Cholesterol: 105 mg/dL — ABNORMAL HIGH (ref 0–99)
TRIGLYCERIDES: 130 mg/dL (ref <150)
VLDL: 26 mg/dL (ref 0–40)

## 2013-08-08 SURGERY — LEFT HEART CATHETERIZATION WITH CORONARY ANGIOGRAM
Anesthesia: LOCAL

## 2013-08-08 MED ORDER — ASPIRIN EC 81 MG PO TBEC
81.0000 mg | DELAYED_RELEASE_TABLET | Freq: Every day | ORAL | Status: DC
  Administered 2013-08-08 – 2013-08-09 (×2): 81 mg via ORAL
  Filled 2013-08-08 (×2): qty 1

## 2013-08-08 MED ORDER — HEPARIN SODIUM (PORCINE) 1000 UNIT/ML IJ SOLN
INTRAMUSCULAR | Status: AC
  Filled 2013-08-08: qty 1

## 2013-08-08 MED ORDER — LIDOCAINE HCL (PF) 1 % IJ SOLN
INTRAMUSCULAR | Status: AC
  Filled 2013-08-08: qty 30

## 2013-08-08 MED ORDER — ATORVASTATIN CALCIUM 40 MG PO TABS
40.0000 mg | ORAL_TABLET | Freq: Every day | ORAL | Status: DC
  Filled 2013-08-08: qty 1

## 2013-08-08 MED ORDER — ACETAMINOPHEN 325 MG PO TABS
650.0000 mg | ORAL_TABLET | ORAL | Status: DC | PRN
  Administered 2013-08-08: 650 mg via ORAL
  Filled 2013-08-08: qty 2

## 2013-08-08 MED ORDER — HEPARIN (PORCINE) IN NACL 2-0.9 UNIT/ML-% IJ SOLN
INTRAMUSCULAR | Status: AC
Start: 2013-08-08 — End: 2013-08-08
  Filled 2013-08-08: qty 1000

## 2013-08-08 MED ORDER — VERAPAMIL HCL 2.5 MG/ML IV SOLN
INTRAVENOUS | Status: AC
  Filled 2013-08-08: qty 2

## 2013-08-08 MED ORDER — MIDAZOLAM HCL 5 MG/5ML IJ SOLN
INTRAMUSCULAR | Status: AC
  Filled 2013-08-08: qty 5

## 2013-08-08 MED ORDER — ISOSORBIDE MONONITRATE ER 30 MG PO TB24
30.0000 mg | ORAL_TABLET | Freq: Every day | ORAL | Status: DC
  Administered 2013-08-08: 30 mg via ORAL
  Filled 2013-08-08 (×2): qty 1

## 2013-08-08 MED ORDER — AMLODIPINE BESYLATE 5 MG PO TABS
5.0000 mg | ORAL_TABLET | Freq: Every day | ORAL | Status: DC
  Administered 2013-08-08: 5 mg via ORAL
  Filled 2013-08-08 (×2): qty 1

## 2013-08-08 MED ORDER — TICAGRELOR 90 MG PO TABS
90.0000 mg | ORAL_TABLET | Freq: Two times a day (BID) | ORAL | Status: DC
  Administered 2013-08-08 – 2013-08-09 (×2): 90 mg via ORAL
  Filled 2013-08-08 (×3): qty 1

## 2013-08-08 NOTE — Interval H&P Note (Signed)
Cath Lab Visit (complete for each Cath Lab visit)  Clinical Evaluation Leading to the Procedure:   ACS: no  Non-ACS:    Anginal Classification: CCS III  Anti-ischemic medical therapy: Maximal Therapy (2 or more classes of medications)  Non-Invasive Test Results: No non-invasive testing performed  Prior CABG:No Previous CABG      History and Physical Interval Note:  08/08/2013 4:32 PM  Karen KitchensStephen M Krupp  has presented today for surgery, with the diagnosis of cp  The various methods of treatment have been discussed with the patient and family. After consideration of risks, benefits and other options for treatment, the patient has consented to  Procedure(s): LEFT HEART CATHETERIZATION WITH CORONARY ANGIOGRAM (N/A) as a surgical intervention .  The patient's history has been reviewed, patient examined, no change in status, stable for surgery.  I have reviewed the patient's chart and labs.  Questions were answered to the patient's satisfaction.     Nosson Wender A

## 2013-08-08 NOTE — Progress Notes (Signed)
    Subjective:  C/O sharp stabbing C/P -"just like before" lasts seconds.   Objective:  Vital Signs in the last 24 hours: Temp:  [97.7 F (36.5 C)-98 F (36.7 C)] 97.7 F (36.5 C) (02/24 0626) Pulse Rate:  [73-88] 77 (02/24 0626) Resp:  [11-18] 18 (02/24 0626) BP: (95-153)/(65-100) 120/69 mmHg (02/24 0626) SpO2:  [96 %-100 %] 96 % (02/24 0626) Weight:  [213 lb 10 oz (96.9 kg)-220 lb 1 oz (99.82 kg)] 213 lb 10 oz (96.9 kg) (02/24 0626)  Intake/Output from previous day:  Intake/Output Summary (Last 24 hours) at 08/08/13 1238 Last data filed at 08/08/13 0900  Gross per 24 hour  Intake    360 ml  Output    375 ml  Net    -15 ml    Physical Exam: General appearance: alert, cooperative and no distress Lungs: clear to auscultation bilaterally Heart: regular rate and rhythm   Rate: 78  Rhythm: normal sinus rhythm  Lab Results:  Recent Labs  08/07/13 1354 08/08/13 0246  WBC 8.8 9.1  HGB 14.9 14.6  PLT 259 241    Recent Labs  08/07/13 1354 08/08/13 0246  NA 142 143  K 4.0 4.1  CL 102 103  CO2 30 27  GLUCOSE 97 114*  BUN 13 15  CREATININE 1.00 1.17    Recent Labs  08/08/13 0246 08/08/13 0745  TROPONINI <0.30 <0.30    Recent Labs  08/07/13 2158  INR 1.02    Imaging: Imaging results have been reviewed  Cardiac Studies:  Assessment/Plan:   Principal Problem:   Unstable angina Active Problems:   CAD- RCA/PDA DES 10/14   DM2 (diabetes mellitus, type 2)   Tobacco abuse   HTN (hypertension)   HLD (hyperlipidemia)    PLAN: Cath today.   Corine ShelterLuke Umaima Scholten PA-C Beeper [phone] 08/08/2013, 12:38 PM

## 2013-08-08 NOTE — Progress Notes (Signed)
TR band removed, gauze and pressure dressing applied to site.  No signs of bleeding, dressing clean, dry, and intact. VSS. Pt has no c/o pain. Pt tolerated well.  Pt educated to leave dressing on for 24 hours and to keep wrist above heart level.  Will continue to monitor pt closely.  Call bell within reach.

## 2013-08-08 NOTE — H&P (View-Only) (Signed)
    Subjective:  C/O sharp stabbing C/P -"just like before" lasts seconds.   Objective:  Vital Signs in the last 24 hours: Temp:  [97.7 F (36.5 C)-98 F (36.7 C)] 97.7 F (36.5 C) (02/24 0626) Pulse Rate:  [73-88] 77 (02/24 0626) Resp:  [11-18] 18 (02/24 0626) BP: (95-153)/(65-100) 120/69 mmHg (02/24 0626) SpO2:  [96 %-100 %] 96 % (02/24 0626) Weight:  [213 lb 10 oz (96.9 kg)-220 lb 1 oz (99.82 kg)] 213 lb 10 oz (96.9 kg) (02/24 0626)  Intake/Output from previous day:  Intake/Output Summary (Last 24 hours) at 08/08/13 1238 Last data filed at 08/08/13 0900  Gross per 24 hour  Intake    360 ml  Output    375 ml  Net    -15 ml    Physical Exam: General appearance: alert, cooperative and no distress Lungs: clear to auscultation bilaterally Heart: regular rate and rhythm   Rate: 78  Rhythm: normal sinus rhythm  Lab Results:  Recent Labs  08/07/13 1354 08/08/13 0246  WBC 8.8 9.1  HGB 14.9 14.6  PLT 259 241    Recent Labs  08/07/13 1354 08/08/13 0246  NA 142 143  K 4.0 4.1  CL 102 103  CO2 30 27  GLUCOSE 97 114*  BUN 13 15  CREATININE 1.00 1.17    Recent Labs  08/08/13 0246 08/08/13 0745  TROPONINI <0.30 <0.30    Recent Labs  08/07/13 2158  INR 1.02    Imaging: Imaging results have been reviewed  Cardiac Studies:  Assessment/Plan:   Principal Problem:   Unstable angina Active Problems:   CAD- RCA/PDA DES 10/14   DM2 (diabetes mellitus, type 2)   Tobacco abuse   HTN (hypertension)   HLD (hyperlipidemia)    PLAN: Cath today.   Suhas Estis PA-C Beeper [phone] 08/08/2013, 12:38 PM    

## 2013-08-08 NOTE — Progress Notes (Signed)
For cath later today. If done radially and no significant problem, we may discharge later today. 

## 2013-08-08 NOTE — H&P (View-Only) (Signed)
For cath later today. If done radially and no significant problem, we may discharge later today.

## 2013-08-08 NOTE — CV Procedure (Signed)
Matthew Horton is a 48 y.o. male    [id number]  [id number] LOCATION:  FACILITY: [facility]  PHYSICIAN: Matthew Biharihomas A. Briceson Broadwater, MD, Matthew Horton 18, 1967   DATE OF PROCEDURE:  08/08/2013    CARDIAC CATHETERIZATION     HISTORY: Matthew Horton is a 48 -year-old male with a history of hypertension, diabetes mellitus, hyperlipidemia, and tobacco abuse. Cardiac catheterization in March 2014 reveal a stenosis of the diagonal branch of the LAD and RCA. He was treated medically. In October 2014 he developed recurrent chest pain and again was found to have stenosis of the LAD and diagonal and underwent FFR which reportedly was 0.9 and 0.93 respectively, and he was found to have progressive high-grade stenoses of his mid RCA and PDA with abnormal FFR which led to percutaneous coronary intervention. He underwent insertion of a Promus premier DES 2.25x12 mm stent in the distal vessel and a 3.0x16 mm in the mid vessel. Patient was readmitted with intermittent episodes of sharp stabbing chest pain but had some similar features as previously. He was referred for cardiac catheterization.   PROCEDURE:  The patient was brought to the second floor [facility] cath lab in the postabsorptive state. The right radial approach was used after a modified Allen's test was positive. After topical lidocaine was administered, a 6 Glidesheath slender was inserted without difficulty. A radial cocktail was administered consisting of nitroglycerin, verapamil, and lidocaine. Initially, a JR 4 right catheter was inserted over a safety J. wire and advanced to the ascending aorta. Heparin 5000 units was administered. This catheter seemed to select the conus branch with inadequate visualization of the major RCA. This catheter was then exchanged for a TIG catheter and selective angiography in the left coronary system was done with this catheter. Due to muscle bridging of the mid LAD IC nitroglycerin was selectively administered.  Attempts at  cannulating the RCA with the TIG were suboptimal due to the conus branch. An EZRad catheter was then inserted and later exchanged for an FR5 catheter. Ultimately, a 6 [place] R guidecatheter was inserted with selective visualization of the RCA. The pigtail catheter was then inserted for left ventriculography. Prior to terminating the study, the old and present angiograms were thoroughly reviewed with Dr. Herbie Horton and due to the presence of coronary spasm and no significant change in the previously noted diagonal and LAD stenoses, the decision was made to augment medical therapy prior to intervening on the diagonal system. The patient left the catheterization laboratory chest pain-free with stable hemodynamics the  HEMODYNAMICS:   Central Aorta: 106/73   Left Ventricle: 106/6  Due to the low LVEDP patient did receive a NS fluid bolus during the procedure.   ANGIOGRAPHY:  1. Left main: Angiographically normal vessel which bifurcated into the LAD and left circumflex coronary artery.  2. LAD: Moderate size vessel that gave rise to a proximal moderate-sized diagonal vessel. There was a 95% stenosis and a very small proximal branch of this diagonal vessel followed by 70-80% stenosis in the diagonal vessel at the site of previous narrowing. The LAD had 30% stenosis at the proximal septal perforating artery. In the mid LAD there was mid systolic bridging with narrowing of approximately 80% during systole. This did improve following intracoronary nitroglycerin administration to 50% 3. Left circumflex: Moderate size vessel that gave rise to 3 marginal vessels. There was 30% narrowing in the distal circumflex marginal branch.  4. Right coronary artery: Moderate size vessel that had a widely patent stent in the  midsegment and widely patent stent in the distal segment. There was evidence for diffuse spasm in the small very distal PDA/inferior  branch  Left ventriculography performed in the RAO projection  revealed normal global the contractility without focal segmental wall motion abnormality.  IMPRESSION:  Normal LV function  Two-vessel cardiac disease with previously noted 7080% stenosis in the diagonal branch of the LAD the very small superior branch of this diagonal vessel with 95% ostial stenosis and evidence for 30% proximal LAD narrowing as well as 80% mid LAD intramyocardial segment with mid systolic bridging which did improve following IC nitroglycerin administration; 30% narrowing in the distal circumflex marginal vessel; and widely patent mid and distal RCA stents with distal inferior LV branch diffuse stenoses/spasm and a small distal vessel   RECOMMENDATION:  Previously, the patient has been demonstrated to have diagonal stenoses with normal FFR. The present diagonal stenosis appears 80% but visually is the same as previously. He also has evidence for mid systolic bridging of the mid LAD and spasm/diffuse stenoses of an inferior distal branch of his RCA. The previously placed RCA stents are widely patent. The patient will be treated with more aggressive medical regimen including nitrates as well as amlodipine. Smoking cessation is imperative. Consideration for nuclear imaging to assess for anterolateral ischemia may be helpful if recurrent symptomatology develops.  Matthew Bihari, MD, [facility] 08/08/2013 11:24 PM

## 2013-08-08 NOTE — Progress Notes (Signed)
UR Completed.  Cece Milhouse Jane [phone] 08/08/2013  

## 2013-08-08 NOTE — Progress Notes (Signed)
ANTICOAGULATION CONSULT NOTE  Pharmacy Consult for Heparin Indication: chest pain/ACS  Allergies  Allergen Reactions  . Ranexa [Ranolazine] Nausea Only and Other (See Comments)    dizziness    Patient Measurements: Height: 5\' 10"  (177.8 cm) Weight: 213 lb 10 oz (96.9 kg) IBW/kg (Calculated) : 73  Vital Signs: Temp: 97.7 F (36.5 C) (02/24 0626) Temp src: Oral (02/24 0626) BP: 120/69 mmHg (02/24 0626) Pulse Rate: 77 (02/24 0626)  Labs:  Recent Labs  08/07/13 1354 08/07/13 2040 08/07/13 2158 08/08/13 0246  HGB 14.9  --   --  14.6  HCT 42.2  --   --  41.3  PLT 259  --   --  241  LABPROT  --   --  13.2  --   INR  --   --  1.02  --   HEPARINUNFRC  --   --   --  0.42  CREATININE 1.00  --   --  1.17  TROPONINI  --  <0.30  --  <0.30   Estimated Creatinine Clearance: 91.2 ml/min (by C-G formula based on Cr of 1.17).  Assessment: 48 y.o. male with chest pain for heparin  Goal of Therapy:  Heparin level 0.3-0.7 units/ml Monitor platelets by anticoagulation protocol: Yes   Plan:  Continue Heparin at current rate  Geannie RisenGreg Olliver Boyadjian, PharmD, BCPS   08/08/2013,7:18 AM

## 2013-08-09 ENCOUNTER — Encounter (HOSPITAL_COMMUNITY): Payer: Self-pay | Admitting: Physician Assistant

## 2013-08-09 DIAGNOSIS — F17200 Nicotine dependence, unspecified, uncomplicated: Secondary | ICD-10-CM

## 2013-08-09 LAB — BASIC METABOLIC PANEL
BUN: 17 mg/dL (ref 6–23)
CHLORIDE: 101 meq/L (ref 96–112)
CO2: 20 meq/L (ref 19–32)
CREATININE: 1.07 mg/dL (ref 0.50–1.35)
Calcium: 9 mg/dL (ref 8.4–10.5)
GFR calc Af Amer: >90 mL/min (ref >90)
GFR calc non Af Amer: 81 mL/min — ABNORMAL LOW (ref >90)
GLUCOSE: 228 mg/dL — AB (ref 70–99)
Potassium: 4.5 mEq/L (ref 3.7–5.3)
Sodium: 137 mEq/L (ref 137–147)

## 2013-08-09 LAB — HEPATIC FUNCTION PANEL
ALK PHOS: 60 U/L (ref 39–117)
ALT: 20 U/L (ref 0–53)
AST: 14 U/L (ref 0–37)
Albumin: 3.6 g/dL (ref 3.5–5.2)
BILIRUBIN TOTAL: 0.4 mg/dL (ref 0.3–1.2)
Total Protein: 6.6 g/dL (ref 6.0–8.3)

## 2013-08-09 MED ORDER — METFORMIN HCL 500 MG PO TABS: 500.0000 mg | ORAL_TABLET | Freq: Every day | ORAL | Status: DC

## 2013-08-09 MED ORDER — ACETAMINOPHEN 325 MG PO TABS: 325.0000 mg | ORAL_TABLET | ORAL | Status: AC | PRN

## 2013-08-09 MED ORDER — ATORVASTATIN CALCIUM 80 MG PO TABS
80.0000 mg | ORAL_TABLET | Freq: Every day | ORAL | Status: DC
  Filled 2013-08-09: qty 1

## 2013-08-09 MED ORDER — ATORVASTATIN CALCIUM 80 MG PO TABS: 80.0000 mg | ORAL_TABLET | Freq: Every evening | ORAL | Status: DC

## 2013-08-09 MED ORDER — METOPROLOL TARTRATE 50 MG PO TABS
50.0000 mg | ORAL_TABLET | Freq: Two times a day (BID) | ORAL | Status: DC
  Administered 2013-08-09: 50 mg via ORAL
  Filled 2013-08-09 (×2): qty 1

## 2013-08-09 MED ORDER — AMLODIPINE BESYLATE 5 MG PO TABS
5.0000 mg | ORAL_TABLET | Freq: Every day | ORAL | Status: DC
  Filled 2013-08-09: qty 1

## 2013-08-09 MED ORDER — ISOSORBIDE MONONITRATE ER 30 MG PO TB24
30.0000 mg | ORAL_TABLET | Freq: Every day | ORAL | Status: DC
  Filled 2013-08-09: qty 1

## 2013-08-09 NOTE — Discharge Summary (Signed)
Long  Discussion with the patient and he is beginning to understand that his current pain is not cardiac in origin. Agree with plans as crafted in noted.

## 2013-08-09 NOTE — Progress Notes (Signed)
Discharge instructions along with med list and scripts provided. Iv d/c'd with catheter intact. Patient transported via wheelchair to lobby for d/c home.Mamie LeversBaldwin, Buelah Rennie E

## 2013-08-09 NOTE — Progress Notes (Addendum)
He is having atypical chest pain C/W musculoskeletal etiology. Ready for discharge today. Needs aggressive RFM. Okay to return to work. Resume beta blocker and stop Imdur/amlodipine.

## 2013-08-09 NOTE — Progress Notes (Signed)
Patient: Matthew KitchensStephen M Horton / Admit Date: 08/07/2013 / Date of Encounter: 08/09/2013, 8:03 AM   Subjective  Had a brief shooting pain this morning at rest. He describes his pain as stabbing events lasting a second to up to 10 seconds without any particular pattern. He has had anywhere from a couple to 60 per day prior to admission, but feels better today. Reports ambulating since 2:30am without any CP or SOB.   However, does have a headache this AM.  Objective   Telemetry: NSR  Physical Exam: Blood pressure 104/54, pulse 86, temperature 97.5 F (36.4 C), temperature source Oral, resp. rate 18, height 5\' 10"  (1.778 m), weight 213 lb 13.5 oz (97 kg), SpO2 99.00%. General: Well developed, well nourished WM in no acute distress. Head: Normocephalic, atraumatic, sclera non-icteric, no xanthomas, nares are without discharge. Neck: Negative for carotid bruits. JVP not elevated. Lungs: Clear bilaterally to auscultation without wheezes, rales, or rhonchi. Breathing is unlabored. Heart: RRR S1 S2 without murmurs, rubs, or gallops.  Abdomen: Soft, non-tender, non-distended with normoactive bowel sounds. No rebound/guarding. Extremities: No clubbing or cyanosis. No edema. Distal pedal pulses are 2+ and equal bilaterally. R cath site without complication, good pulse, no hematoma Neuro: Alert and oriented X 3. Moves all extremities spontaneously. Psych:  Responds to questions appropriately with a normal affect.   Intake/Output Summary (Last 24 hours) at 08/09/13 0803 Last data filed at 08/08/13 1800  Gross per 24 hour  Intake    360 ml  Output    400 ml  Net    -40 ml    Inpatient Medications:  . amLODipine  5 mg Oral Daily  . aspirin EC  81 mg Oral Daily  . atorvastatin  40 mg Oral q1800  . isosorbide mononitrate  30 mg Oral Daily  . Ticagrelor  90 mg Oral BID   Infusions:    Labs:  Recent Labs  08/07/13 1354 08/08/13 0246  NA 142 143  K 4.0 4.1  CL 102 103  CO2 30 27  GLUCOSE  97 114*  BUN 13 15  CREATININE 1.00 1.17  CALCIUM 9.7 9.2   No results found for this basename: AST, ALT, ALKPHOS, BILITOT, PROT, ALBUMIN,  in the last 72 hours  Recent Labs  08/07/13 1354 08/08/13 0246  WBC 8.8 9.1  HGB 14.9 14.6  HCT 42.2 41.3  MCV 89.8 89.8  PLT 259 241    Recent Labs  08/07/13 2040 08/08/13 0246 08/08/13 0745  TROPONINI <0.30 <0.30 <0.30   No components found with this basename: POCBNP,  No results found for this basename: HGBA1C,  in the last 72 hours   Radiology/Studies:  Dg Chest 2 View  08/07/2013   CLINICAL DATA:  CHEST PAIN  EXAM: CHEST  2 VIEW  COMPARISON:  DG CHEST 2 VIEW dated 03/21/2013  FINDINGS: The heart size and mediastinal contours are within normal limits. Both lungs are clear. The visualized skeletal structures are unremarkable.  IMPRESSION: No active cardiopulmonary disease.   Electronically Signed   By: Salome HolmesHector  Cooper M.D.   On: 08/07/2013 14:11     Assessment and Plan  1. CAD s/p DES x 2 to RCA 03/2013 admitted with recurrent chest pain - pain itself is somewhat atypical for cardiac discomfort, but he does have known disease. Cath yesterday showing 70-80% diag visually the same as before, 95% ostial small diag, 80% mid LAD with mid systolic bridging, spasm/diffuse stenoses of inferior RCA - plan is for more aggressive medical regimen  including nitrates as well as amlodipine. Dr. Landry Dyke note recommends to consider nuclear imaging if symptomatology recurs (last nuc 04/2013 - small area of inferior reversibility at apex). H/o dizziness/nausea with Ranexa. BP somewhat on the low side but just started on meds last night. Will push back the 10am dosing to 1pm and ask MD to weigh on resuming lower dose of his metoprolol (on 50mg  BID at home, not on it here). We may need to reconsider Imdur if headache persists. 2. Tobacco abuse - cessation is non-negotiable. He is aware. 3. PAF after PCI 03/2013 - maintaining NSR. 4. CKD stage II - needs recheck  BMET this AM. 5. HTN - controlled.  6. DM - resuming metformin 48-hr post-cath.  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 F/u BMET - possible DC today if this looks OK.  Signed, Ronie Spies PA-C

## 2013-08-09 NOTE — Discharge Summary (Signed)
Discharge Summary   Patient ID: THAILAND DUBE MRN: [id number], DOB/AGE: Apr 01, 1966 48 y.o. Admit date: 08/07/2013 D/C date:     08/09/2013  Primary Care Provider: Chrystie Nose, MD Primary Cardiologist: Dr. Rennis Golden   Primary Discharge Diagnoses:  1. Chest pain, suspected musculoskeletal 2. CAD  - s/p DES x 2 to RCA 03/2013, residual disease for medical therapy 3. Tobacco abuse 4. PAF after PCI 03/2013 without clinical recurrence 5. CKD stage II 6. HTN 7. Diabetes mellitus 8. Hyperlipidemia  Secondary Discharge Diagnoses:  1. OSA 2. Low testosterone 3. H/o torticollis  Hospital Course: Mr. Grosso is a 48 y.o. male with history of CAD s/p DES x2 to the RCA in 03/2013 with transient afib during that time, HTN, DM, HLD, OSA, CKD, & long-standing tobacco abuse who was admitted 08/07/2013 with chest pain.   To summarize his history of CAD, he had a cath in 08/2012 for an abnormal stress test. At that time he shown to have moderate two-vessel disease in the diagonal branch of the LAD as well as the RCA. There was also an ostial OM1 lesion. An FFR of the RCA 70% lesion wasn't not significant. Medical therapy was recommended. The patient continued to have intermittent episodes of chest discomfort since that time. Despite being on aggressive therapy, he presented again on 03/2013 with substernal chest pain like a brick sitting on his chest. This was relieved by nitroglycerin - ECG was nonacute and troponins were negative. Considering his history and high likelihood for progression of disease, a relook cath was recommended. This was performed by Dr. Herbie Baltimore, via the right radial artery. He was found to have severe 2 site CAD in the mid RCA and mid PDA, demonstrating significant progression of disease since March of 2014. Both lesions were treated with DES. He had normal LVEF and EDP. He was recently seen in the office 05/2013 following another abnormal stress test, felt to be low risk small area of  inferior reversibility at the apex. At that time he was not having further significant chest pain so was managed medically.  He presented back to [facility] this admission with fleeting sporadic chest discomfort. He thought it felt similar to prior angina. He described this as sharp shooting pain lasting seconds to up to 10 seconds at a time, as many as 60 times a day, increasing in frequency over the last 2 weeks. No nausea, shortness or breath, or diaphoresis. Troponins were negative x 2 and EKG was without ischemic changes. Symptoms were somewhat atypical but felt concerning for unstable angina given reportedly similar nature to prior angina. In light of his history, cardiac cath was pursued. This was performed 08/08/13 demonstrating the following per Dr. Landry Dyke cath note: "Previously, the patient has been demonstrated to have diagonal stenoses with normal FFR. The present diagonal stenosis appears 80% but visually is the same as previously. He also has evidence for mid systolic bridging of the mid LAD and spasm/diffuse stenoses of an inferior distal branch of his RCA. The previously placed RCA stents are widely patent." Initially the plan was to add nitrates and amlodipine. He developed a headache from the Imdur. Dr. Katrinka Blazing had an indepth conversation with the patient this morning. He feels the patient's pain is quite atypical and likely musculoskeletal. He does not feel that the patient's pain is related to his coronary status, thus discontinued the amlodipine and Imdur and recommended to continue home metoprolol. Followup BMET was stable. The patient ambulated today without any exertional  symptoms. He will hold Metformin until AM of 2/27 (late cath 2/24). Smoking cessation was recommended and statin was titrated. We will have the patient followup in the office (left message on scheduling voicemail). Dr. Katrinka BlazingSmith has seen and examined the patient today and feels he is stable for discharge.  Consider  followup LFTs/lipids 6 weeks given statin titration.  Discharge Vitals: Blood pressure 127/72, pulse 86, temperature 97.5 F (36.4 C), temperature source Oral, resp. rate 18, height 5\' 10"  (1.778 m), weight 213 lb 13.5 oz (97 kg), SpO2 99.00%.  Labs: Lab Results  Component Value Date   WBC 9.1 08/08/2013   HGB 14.6 08/08/2013   HCT 41.3 08/08/2013   MCV 89.8 08/08/2013   PLT 241 08/08/2013     Recent Labs Lab 08/09/13 0801  NA 137  K 4.5  CL 101  CO2 20  BUN 17  CREATININE 1.07  CALCIUM 9.0  GLUCOSE 228*    Recent Labs  08/07/13 2040 08/08/13 0246 08/08/13 0745  TROPONINI <0.30 <0.30 <0.30   Lab Results  Component Value Date   CHOL 167 08/08/2013   HDL 36* 08/08/2013   LDLCALC 105* 08/08/2013   TRIG 130 08/08/2013    Diagnostic Studies/Procedures   Dg Chest 2 View 08/07/2013   CLINICAL DATA:  CHEST PAIN  EXAM: CHEST  2 VIEW  COMPARISON:  DG CHEST 2 VIEW dated 03/21/2013  FINDINGS: The heart size and mediastinal contours are within normal limits. Both lungs are clear. The visualized skeletal structures are unremarkable.  IMPRESSION: No active cardiopulmonary disease.   Electronically Signed   By: Salome HolmesHector  Cooper M.D.   On: 08/07/2013 14:11   Cardiac Cath 08/08/13 HISTORY: Mr. Thurmon FairStephen Emme is a 48 -year-old male with a history of hypertension, diabetes mellitus, hyperlipidemia, and tobacco abuse. Cardiac catheterization in March 2014 reveal a stenosis of the diagonal branch of the LAD and RCA. He was treated medically. In October 2014 he developed recurrent chest pain and again was found to have stenosis of the LAD and diagonal and underwent FFR which reportedly was 0.9 and 0.93 respectively, and he was found to have progressive high-grade stenoses of his mid RCA and PDA with abnormal FFR which led to percutaneous coronary intervention. He underwent insertion of a Promus premier DES 2.25x12 mm stent in the distal vessel and a 3.0x16 mm in the mid vessel. Patient was readmitted  with intermittent episodes of sharp stabbing chest pain but had some similar features as previously. He was referred for cardiac catheterization.  PROCEDURE:  The patient was brought to the second floor [facility] cath lab in the postabsorptive state. The right radial approach was used after a modified Allen's test was positive. After topical lidocaine was administered, a 6 Glidesheath slender was inserted without difficulty. A radial cocktail was administered consisting of nitroglycerin, verapamil, and lidocaine. Initially, a JR 4 right catheter was inserted over a safety J. wire and advanced to the ascending aorta. Heparin 5000 units was administered. This catheter seemed to select the conus branch with inadequate visualization of the major RCA. This catheter was then exchanged for a TIG catheter and selective angiography in the left coronary system was done with this catheter. Due to muscle bridging of the mid LAD IC nitroglycerin was selectively administered. Attempts at cannulating the RCA with the TIG were suboptimal due to the conus branch. An EZRad catheter was then inserted and later exchanged for an FR5 catheter. Ultimately, a 6 [place] Launcher R guidecatheter was inserted with selective  visualization of the RCA. The pigtail catheter was then inserted for left ventriculography. Prior to terminating the study, the old and present angiograms were thoroughly reviewed with Dr. Herbie Baltimore and due to the presence of coronary spasm and no significant change in the previously noted diagonal and LAD stenoses, the decision was made to augment medical therapy prior to intervening on the diagonal system. The patient left the catheterization laboratory chest pain-free with stable hemodynamics the  HEMODYNAMICS:  Central Aorta: 106/73  Left Ventricle: 106/6  Due to the low LVEDP patient did receive a NS fluid bolus during the procedure.  ANGIOGRAPHY:  1. Left main: Angiographically normal vessel which  bifurcated into the LAD and left circumflex coronary artery.  2. LAD: Moderate size vessel that gave rise to a proximal moderate-sized diagonal vessel. There was a 95% stenosis and a very small proximal branch of this diagonal vessel followed by 70-80% stenosis in the diagonal vessel at the site of previous narrowing. The LAD had 30% stenosis at the proximal septal perforating artery. In the mid LAD there was mid systolic bridging with narrowing of approximately 80% during systole. This did improve following intracoronary nitroglycerin administration to 50%  3. Left circumflex: Moderate size vessel that gave rise to 3 marginal vessels. There was 30% narrowing in the distal circumflex marginal branch.  4. Right coronary artery: Moderate size vessel that had a widely patent stent in the midsegment and widely patent stent in the distal segment. There was evidence for diffuse spasm in the small very distal PDA/inferior branch  Left ventriculography performed in the RAO projection revealed normal global the contractility without focal segmental wall motion abnormality.  IMPRESSION:  Normal LV function  Two-vessel cardiac disease with previously noted 7080% stenosis in the diagonal branch of the LAD the very small superior branch of this diagonal vessel with 95% ostial stenosis and evidence for 30% proximal LAD narrowing as well as 80% mid LAD intramyocardial segment with mid systolic bridging which did improve following IC nitroglycerin administration; 30% narrowing in the distal circumflex marginal vessel; and widely patent mid and distal RCA stents with distal inferior LV branch diffuse stenoses/spasm and a small distal vessel  RECOMMENDATION:  Previously, the patient has been demonstrated to have diagonal stenoses with normal FFR. The present diagonal stenosis appears 80% but visually is the same as previously. He also has evidence for mid systolic bridging of the mid LAD and spasm/diffuse stenoses of an  inferior distal branch of his RCA. The previously placed RCA stents are widely patent. The patient will be treated with more aggressive medical regimen including nitrates as well as amlodipine. Smoking cessation is imperative. Consideration for nuclear imaging to assess for anterolateral ischemia may be helpful if recurrent symptomatology develops.   Discharge Medications     Medication List    STOP taking these medications       simvastatin 40 MG tablet  Commonly known as:  ZOCOR      TAKE these medications       acetaminophen 325 MG tablet  Commonly known as:  TYLENOL  Take 1-2 tablets (325-650 mg total) by mouth every 4 (four) hours as needed for mild pain or moderate pain.     aspirin EC 81 MG tablet  Take 81 mg by mouth every morning.     atorvastatin 80 MG tablet  Commonly known as:  LIPITOR  Take 1 tablet (80 mg total) by mouth every evening.     lisinopril 5 MG tablet  Commonly known  as:  PRINIVIL,ZESTRIL  Take 1 tablet (5 mg total) by mouth daily.     metFORMIN 500 MG tablet  Commonly known as:  GLUCOPHAGE  Take 1 tablet (500 mg total) by mouth daily with breakfast.  Start taking on:  08/11/2013     metoprolol 50 MG tablet  Commonly known as:  LOPRESSOR  Take 50 mg by mouth 2 (two) times daily.     nitroGLYCERIN 0.4 MG SL tablet  Commonly known as:  NITROSTAT  Place 1 tablet (0.4 mg total) under the tongue every 5 (five) minutes as needed for chest pain.     Ticagrelor 90 MG Tabs tablet  Commonly known as:  BRILINTA  Take 1 tablet (90 mg total) by mouth 2 (two) times daily.        Disposition   The patient will be discharged in stable condition to home. Discharge Orders   Future Orders Complete By Expires   Diet - low sodium heart healthy  As directed    Increase activity slowly  As directed    Scheduling Instructions:     No driving for 1 day. No lifting over 5 lbs for 1 week. No sexual activity for 1 week. You may return to work with the previously  mentioned restrictions. Keep procedure site clean & dry. If you notice increased pain, swelling, bleeding or pus, call/return!  You may shower, but no soaking baths/hot tubs/pools for 1 week.  Do not restart metformin until the morning of 08/11/13.  You must stop smoking. It is non-negotiable.     Follow-up Information   Follow up with Chrystie Nose, MD. (Our office will call you for a follow-up appointment. Please call the office if you have not heard from [place] within 3 days.)    Specialty:  Cardiology   Contact information:   [address] [place] [address] [phone]         Duration of Discharge Encounter: Greater than 30 minutes including physician and PA time.  Signed, Ronie Spies PA-C 08/09/2013, 11:53 AM

## 2013-08-11 ENCOUNTER — Telehealth: Payer: Self-pay | Admitting: Internal Medicine

## 2013-08-11 NOTE — Telephone Encounter (Signed)
Returned call to patient. Stated he read the hospital discharge paperwork after he made the call asking for something to help his MSK chest pain.. Advised on the tylenol PO as directed.. Patient agreeable. Patient asked if could move post-hosp/cath appmt to next Friday 3/6.Marland Kitchen. Will inform scheduler

## 2013-08-11 NOTE — Telephone Encounter (Signed)
Please call- wants to know what can he get for the severe pain he has in the center of his chest.Its not his heart,says it is  Some kind of nerve damage.

## 2013-08-18 ENCOUNTER — Ambulatory Visit (INDEPENDENT_AMBULATORY_CARE_PROVIDER_SITE_OTHER): Payer: Managed Care, Other (non HMO) | Admitting: Cardiology

## 2013-08-18 VITALS — BP 118/78 | HR 82 | Ht 70.0 in | Wt 218.0 lb

## 2013-08-18 DIAGNOSIS — R079 Chest pain, unspecified: Secondary | ICD-10-CM

## 2013-08-18 MED ORDER — AMLODIPINE BESYLATE 5 MG PO TABS: 5.0000 mg | ORAL_TABLET | Freq: Every day | ORAL | Status: DC

## 2013-08-18 NOTE — Patient Instructions (Addendum)
Your physician recommends that you schedule a follow-up appointment in:  6 months to see Dr. Rennis GoldenHilty and restart your simvastatin 40 mg one time a day

## 2013-08-21 ENCOUNTER — Encounter: Payer: Self-pay | Admitting: Cardiology

## 2013-08-21 NOTE — Assessment & Plan Note (Signed)
He continues to have frequent, almost daily, chest pain. Recent LHC on 08/08/13 showed patent RCA stent and no obstructive CAD. Although the characteristics almost mimic typical musculoskeletal pain, the features are interestingly very similar to the pain he had when he required PCI + DES to his RCA in 2014. Based on the fact that he is having similar symptoms and because his recent LHC demonstrated spasms, I feel that he would benefit from a CCB. His BP is stable. Will initiate 5 mg of amlodipine daily.

## 2013-08-21 NOTE — Progress Notes (Signed)
Patient ID: Matthew KitchensStephen M Horton, male   DOB: July 18, 1965, 48 y.o.   MRN: [id number]    08/21/2013 Matthew KitchensStephen M Horton   July 18, 1965  [id number]  Primary Physicia Pcp Not In System Primary Cardiologist: Dr. Rennis GoldenHilty  HPI:  Matthew Horton presents to clinic today for post hospital follow-up for chest pain. His complete cardiac history and hospital course is listed below.   Hospital Course: Matthew Horton is a 48 y.o. male with history of CAD s/p DES x2 to the RCA in 03/2013 with transient afib during that time, HTN, DM, HLD, OSA, CKD, & long-standing tobacco abuse who was admitted 08/07/2013 with chest pain.   To summarize his history of CAD, he had a cath in 08/2012 for an abnormal stress test. At that time he shown to have moderate two-vessel disease in the diagonal branch of the LAD as well as the RCA. There was also an ostial OM1 lesion. An FFR of the RCA 70% lesion wasn't not significant. Medical therapy was recommended. The patient continued to have intermittent episodes of chest discomfort since that time. Despite being on aggressive therapy, he presented again on 03/2013 with substernal chest pain like a brick sitting on his chest. This was relieved by nitroglycerin - ECG was nonacute and troponins were negative. Considering his history and high likelihood for progression of disease, a relook cath was recommended. This was performed by Dr. Herbie BaltimoreHarding, via the right radial artery. He was found to have severe 2 site CAD in the mid RCA and mid PDA, demonstrating significant progression of disease since March of 2014. Both lesions were treated with DES. He had normal LVEF and EDP. He was recently seen in the office 05/2013 following another abnormal stress test, felt to be low risk small area of inferior reversibility at the apex. At that time he was not having further significant chest pain so was managed medically.   [facility] with fleeting sporadic chest discomfort. He  thought it felt similar to prior angina. He described this as sharp shooting pain lasting seconds to up to 10 seconds at a time, as many as 60 times a day, increasing in frequency over the last 2 weeks. No nausea, shortness or breath, or diaphoresis. Troponins were negative x 2 and EKG was without ischemic changes. Symptoms were somewhat atypical but felt concerning for unstable angina given reportedly similar nature to prior angina. In light of his history, cardiac cath was pursued. This was performed 08/08/13 demonstrating the following per Dr. Landry DykeKelly's cath note: "Previously, the patient has been demonstrated to have diagonal stenoses with normal FFR. The present diagonal stenosis appears 80% but visually is the same as previously. He also has evidence for mid systolic bridging of the mid LAD and spasm/diffuse stenoses of an inferior distal branch of his RCA. The previously placed RCA stents are widely patent." Initially the plan was to add nitrates and amlodipine. He developed a headache from the Imdur. Dr. Katrinka BlazingSmith had an indepth conversation with the patient this morning. He feels the patient's pain is quite atypical and likely musculoskeletal. He does not feel that the patient's pain is related to his coronary status, thus discontinued the amlodipine and Imdur and recommended to continue home metoprolol. Followup BMET was stable. The patient ambulated today without any exertional symptoms. He will hold Metformin until AM of 2/27 (late cath 2/24). Smoking cessation was recommended and statin was titrated. We will have the patient followup in the office (left message on  scheduling voicemail). Dr. Katrinka Blazing has seen and examined the patient today and feels he is stable for discharge.  Current Status:  He presents to clinic today stating that he continues to have frequent episodes of the same pain he presented to [facility] with. It sounds very atypical and much like musculoskeletal pain. It is described as sharp, "stabling  like" substernal pain, lasting only a few seconds at a time. However, he notes that it is the same type of pain he had back in 2014 when he required PCI + DES to his RCA back in 2014. This pain happens frequently and is worrisome to him. He denies another associated symptoms. No SOB, dizziness, n/v, diaphoresis, syncope and near syncope.   Current Outpatient Prescriptions  Medication Sig Dispense Refill  . acetaminophen (TYLENOL) 325 MG tablet Take 1-2 tablets (325-650 mg total) by mouth every 4 (four) hours as needed for mild pain or moderate pain.      Marland Kitchen amLODipine (NORVASC) 5 MG tablet Take 5 mg by mouth daily.      Marland Kitchen aspirin EC 81 MG tablet Take 81 mg by mouth every morning.       Marland Kitchen atorvastatin (LIPITOR) 80 MG tablet Take 1 tablet (80 mg total) by mouth every evening.  30 tablet  6  . lisinopril (PRINIVIL,ZESTRIL) 5 MG tablet Take 1 tablet (5 mg total) by mouth daily.  30 tablet  5  . metFORMIN (GLUCOPHAGE) 500 MG tablet Take 1 tablet (500 mg total) by mouth daily with breakfast.      . metoprolol (LOPRESSOR) 50 MG tablet Take 50 mg by mouth 2 (two) times daily.      . nitroGLYCERIN (NITROSTAT) 0.4 MG SL tablet Place 1 tablet (0.4 mg total) under the tongue every 5 (five) minutes as needed for chest pain.  25 tablet  2  . Ticagrelor (BRILINTA) 90 MG TABS tablet Take 1 tablet (90 mg total) by mouth 2 (two) times daily.  60 tablet  10   No current facility-administered medications for this visit.    Allergies  Allergen Reactions  . Ranexa [Ranolazine] Nausea Only and Other (See Comments)    dizziness    History   Social History  . Marital Status: Married    Spouse Name: N/A    Number of Children: N/A  . Years of Education: N/A   Occupational History  . Not on file.   Social History Main Topics  . Smoking status: Former Smoker -- 1.00 packs/day for 30 years    Types: Cigarettes    Quit date: 05/22/2013  . Smokeless tobacco: Not on file  . Alcohol Use: No  . Drug Use: No  .  Sexual Activity: Not on file   Other Topics Concern  . Not on file   Social History Narrative   Drive a truck for a living.     Review of Systems: General: negative for chills, fever, night sweats or weight changes.  Cardiovascular: negative for chest pain, dyspnea on exertion, edema, orthopnea, palpitations, paroxysmal nocturnal dyspnea or shortness of breath Dermatological: negative for rash Respiratory: negative for cough or wheezing Urologic: negative for hematuria Abdominal: negative for nausea, vomiting, diarrhea, bright red blood per rectum, melena, or hematemesis Neurologic: negative for visual changes, syncope, or dizziness All other systems reviewed and are otherwise negative except as noted above.    Blood pressure 118/78, pulse 82, height 5\' 10"  (1.778 m), weight 218 lb (98.884 kg).  General appearance: alert, cooperative and no distress Neck: no  carotid bruit and no JVD Lungs: clear to auscultation bilaterally Heart: regular rate and rhythm, S1, S2 normal, no murmur, click, rub or gallop Extremities: no LEE Pulses: 2+ and symmetric Skin: warm and dry Neurologic: Grossly normal    ASSESSMENT AND PLAN:   Chest pain He continues to have frequent, almost daily, chest pain. Recent LHC on 08/08/13 showed patent RCA stent and no obstructive CAD. Although the characteristics almost mimic typical musculoskeletal pain, the features are interestingly very similar to the pain he had when he required PCI + DES to his RCA in 2014. Based on the fact that he is having similar symptoms and because his recent LHC demonstrated spasms, I feel that he would benefit from a CCB. His BP is stable. Will initiate 5 mg of amlodipine daily.     PLAN  Will initiate 5 mg of amlodipine daily for potential coronary spasm. Pt instructed to discontinue and notify our office if he develops any significant dizziness. As long as he tolerates the medication well and has no dizziness, I see no reason  why he cannot return to work by the middle of next week. He was instructed to continue with his yearly f/u appts with Dr. Rennis Golden, which will be due in October. He was instructed to f/u sooner if needed.   Chidi Shirer, BRITTAINYPA-C 08/21/2013 6:40 PM

## 2013-08-23 ENCOUNTER — Ambulatory Visit: Payer: Managed Care, Other (non HMO) | Admitting: Cardiology

## 2013-09-13 ENCOUNTER — Other Ambulatory Visit (HOSPITAL_COMMUNITY): Payer: Self-pay | Admitting: Cardiology

## 2013-09-13 NOTE — Telephone Encounter (Signed)
Rx was sent to pharmacy electronically. 

## 2013-11-06 ENCOUNTER — Other Ambulatory Visit: Payer: Self-pay | Admitting: Internal Medicine

## 2013-11-07 NOTE — Telephone Encounter (Signed)
Refill refused. Takes lipitor now

## 2013-11-10 ENCOUNTER — Ambulatory Visit (INDEPENDENT_AMBULATORY_CARE_PROVIDER_SITE_OTHER): Payer: Managed Care, Other (non HMO) | Admitting: Internal Medicine

## 2013-11-10 ENCOUNTER — Encounter: Payer: Self-pay | Admitting: Internal Medicine

## 2013-11-10 VITALS — BP 132/68 | HR 93 | Ht 70.0 in | Wt 222.6 lb

## 2013-11-10 DIAGNOSIS — I2510 Atherosclerotic heart disease of native coronary artery without angina pectoris: Secondary | ICD-10-CM

## 2013-11-10 DIAGNOSIS — R079 Chest pain, unspecified: Secondary | ICD-10-CM

## 2013-11-10 DIAGNOSIS — E785 Hyperlipidemia, unspecified: Secondary | ICD-10-CM

## 2013-11-10 DIAGNOSIS — I4891 Unspecified atrial fibrillation: Secondary | ICD-10-CM

## 2013-11-10 DIAGNOSIS — I10 Essential (primary) hypertension: Secondary | ICD-10-CM

## 2013-11-10 MED ORDER — EZETIMIBE-SIMVASTATIN 10-20 MG PO TABS: 1.0000 | ORAL_TABLET | Freq: Every day | ORAL | Status: DC

## 2013-11-10 NOTE — Progress Notes (Signed)
OFFICE NOTE  Chief Complaint:  Hospital follow-up, marked improvement in dyspnea, no chest pain  Primary Care Physician: Pcp Not In System  HPI:  Matthew Horton is a 48 y.o. male with PMH of HTN, DM-2, HLD, OSA & long-standing tobacco abuse with known moderate coronary disease by catheterization in March of 2014 for an abnormal stress test. At that time he shown to have moderate two-vessel disease in the diagonal branch of the LAD as well as the RCA. There was also an ostial OM1 lesion. An FFR of the RCA 70% lesion wasn't not significant. Medical therapy was recommended, The patient continued to have intermittent episodes of chest discomfort since that time. Despite being on aggressive therapy, he presented again on the morning of March 21, 2013 with substernal chest pain like a brick sitting on his chest. This was relieved by nitroglycerin. His EKG demonstrated no acute changes. He was admitted and ruled out for MI with negative troponins x 3. However, considering his history and high likelihood for progression of disease, a relook cath was recommended. This was performed by Dr. Herbie BaltimoreHarding, via the right radial artery. He was found to have severe 2 site CAD in the mid RCA and mid PDA, demonstrating significant progression of disease since March of 2014. Both lesions were treated with DES. He had normal LVEF and EDP. He tolerated the procedure well but was noted to have post PCI atrial fibrillation with RVR. He left the cath lab in stable condition. He had no further A-fib on telemetry. He remained CP free. The right radial access site remained stable. His labs were stable. He did have mild hypertension and was started on a low dose ACE-I. He was resumed on his BB and statin. He was placed on DAPT with ASA and Brilinta.  He returns today in follow-up and is feeling well. His radial cath site has a good pulse and there is good distal sensation and perfusion. He has had no further chest pain and no  significant DOE.  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  He reports that he has stopped smoking with this recent hospitalization.  Matthew Horton was recently hospitalized again for chest pain, he underwent repeat cardiac catheterization which showed the folllowing:  Normal LV function   Two-vessel cardiac disease with previously noted 7080% stenosis in the diagonal branch of the LAD the very small superior branch of this diagonal vessel with 95% ostial stenosis and evidence for 30% proximal LAD narrowing as well as 80% mid LAD intramyocardial segment with mid systolic bridging which did improve following IC nitroglycerin administration; 30% narrowing in the distal circumflex marginal vessel; and widely patent mid and distal RCA stents with distal inferior LV branch diffuse stenoses/spasm and a small distal vessel   RECOMMENDATION:  Previously, the patient has been demonstrated to have diagonal stenoses with normal FFR. The present diagonal stenosis appears 80% but visually is the same as previously. He also has evidence for mid systolic bridging of the mid LAD and spasm/diffuse stenoses of an inferior distal branch of his RCA. The previously placed RCA stents are widely patent. The patient will be treated with more aggressive medical regimen including nitrates as well as amlodipine. Smoking cessation is imperative. Consideration for nuclear imaging to assess for anterolateral ischemia may be helpful if recurrent symptomatology develops.  He followed up in our office postcardiac catheterization and reported some persistent sharp electric quick chest  pains in the upper midepigastric area. He was started on low-dose amlodipine for possible coronary spasm and he reported initially his symptoms improved significantly however recently they picked up again. His EKG shows no acute changes. He has not taken nitroglycerin because it "makes him feel bad".    PMHx:  Past Medical History  Diagnosis Date  . HTN (hypertension)   . DM2 (diabetes mellitus, type 2)   . CKD (chronic kidney disease) stage 2, GFR 60-89 ml/min   . HLD (hyperlipidemia)   . Tobacco abuse   . Cluster headache   . OSA (obstructive sleep apnea)   . Low testosterone   . Nonallopathic lesion of cervical region   . Torticollis, acute   . Coronary artery disease     a. s/p DES x 2 to RCA 03/2013 - residual disease for med rx.  . Atrial fibrillation     a. Transient during adm 03/2013 for PCI.     Past Surgical History  Procedure Laterality Date  . Tonsillectomy    . Traumatic amputation and reattachment Left     Index finger  . Coronary angioplasty with stent placement  03/22/2013    "2 stents", Dr. Ranae Palms  . Cardiac catheterization  08/08/2013    FAMHx:  Family History  Problem Relation Age of Onset  . Coronary artery disease Father 56    MI and s/p CABG  . Diabetes Father   . Hypertension Father   . Hyperlipidemia Father   . Hyperlipidemia Mother     SOCHx:   reports that he quit smoking about 3 months ago. His smoking use included Cigarettes. He has a 30 pack-year smoking history. He does not have any smokeless tobacco history on file. He reports that he does not drink alcohol or use illicit drugs.  ALLERGIES:  Allergies  Allergen Reactions  . Ranexa [Ranolazine] Nausea Only and Other (See Comments)    dizziness    ROS: A comprehensive review of systems was negative except for: Cardiovascular: positive for chest pain  HOME MEDS: Current Outpatient Prescriptions  Medication Sig Dispense Refill  . acetaminophen (TYLENOL) 325 MG tablet Take 1-2 tablets (325-650 mg total) by mouth every 4 (four) hours as needed for mild pain or moderate pain.      Marland Kitchen amLODipine (NORVASC) 5 MG tablet Take 5 mg by mouth daily.      Marland Kitchen aspirin EC 81 MG tablet Take 81 mg by mouth every morning.       Marland Kitchen lisinopril (PRINIVIL,ZESTRIL) 5 MG tablet TAKE 1 TABLET (5 MG  TOTAL) BY MOUTH DAILY.  30 tablet  11  . metFORMIN (GLUCOPHAGE) 500 MG tablet Take 1 tablet (500 mg total) by mouth daily with breakfast.      . metoprolol (LOPRESSOR) 50 MG tablet Take 50 mg by mouth 2 (two) times daily.      . nitroGLYCERIN (NITROSTAT) 0.4 MG SL tablet Place 1 tablet (0.4 mg total) under the tongue every 5 (five) minutes as needed for chest pain.  25 tablet  2  . Ticagrelor (BRILINTA) 90 MG TABS tablet Take 1 tablet (90 mg total) by mouth 2 (two) times daily.  60 tablet  10  . ezetimibe-simvastatin (VYTORIN) 10-20 MG per tablet Take 1 tablet by mouth daily.  30 tablet  11   No current facility-administered medications for this visit.    LABS/IMAGING: No results found for this or any previous visit (from the past 48 hour(s)). No results found.  VITALS: BP 132/68  Pulse 93  Ht 5\' 10"  (1.778 m)  Wt 222 lb 9.6 oz (100.971 kg)  BMI 31.94 kg/m2  EXAM: deferred  EKG: Normal sinus rhythm at 93  ASSESSMENT: 1. CAD s/p PCI to the mid-RCA and distal PDA with DES (03/2013) - will need ASA and Brillinta for 1 year 2. HTN - controlled 3. Hyperlipidemia - on zocor 4. Tobacco dependence - has quit with recent hospitalization 5. Abnormal NST 6. Atypical chest pain  PLAN: 1.   Mr. Tolbert is reporting chest pain which is atypical for coronary disease. His recent catheterization is somewhat reassuring. He has not had a significant improvement with amlodipine. I would recommend continuing his current medications. He is interested in restarting simvastatin however he will require a higher dose than is permissible to take with amlodipine per FDA requirements. I recommended he tries Vytorin 10/20 mg daily. I provided samples today and a prescription. He will need to check with his pharmacy regarding cost as this is a brand name medication. Plan to see him back annually or sooner as necessary.  Please don't hesitate to contact me if you have any questions.  Chrystie Nose, MD,  [facility] Attending Cardiologist [facility]  Chrystie Nose 11/10/2013, 3:17 PM

## 2013-11-10 NOTE — Patient Instructions (Signed)
Your physician has recommended you make the following change in your medication: START vytorin 10-20mg  once daily.   Your physician wants you to follow-up in: 1 year. You will receive a reminder letter in the mail two months in advance. If you don't receive a letter, please call our office to schedule the follow-up appointment.

## 2014-02-12 ENCOUNTER — Other Ambulatory Visit (HOSPITAL_COMMUNITY): Payer: Self-pay | Admitting: Cardiology

## 2014-02-12 NOTE — Telephone Encounter (Signed)
Rx was sent to pharmacy electronically. 

## 2014-02-26 ENCOUNTER — Telehealth: Payer: Self-pay | Admitting: Internal Medicine

## 2014-02-26 NOTE — Telephone Encounter (Signed)
Spoke with pt, yesterday he developed pain in his low back. He was working with a shovel yesterday doing light work when he first noticed the discomfort. Today he is having a sharpe pain that runs through his back with movement. Pt reports heat will help some . Explained sounds like muscle strain or pull, he will cont to use heat, tylenol and muscle rub to the area.  He reports he has stopped the vytorin he was started on, he did not like the muscle aches. He would like to go back to simvastatin. Will forward to dr hilty for dosing

## 2014-02-26 NOTE — Telephone Encounter (Signed)
Having some pain in his back where he can't move and wants to know what shall he do . Please Call  Thanks

## 2014-02-27 NOTE — Telephone Encounter (Signed)
Returned call to patient.   In regards to back pain, he had mentioned in in initial triage call as he didn't know if we had regular MD's at this office who could address his back pain. He states he hurt it this weekend.   In regards to vytorin, patient states it cost him $60/month and he will NOT pay for this. He wishes to be on a generic statin medication. Will defer to Dr. Rennis Golden to advise on dose/medication to best manage lipids. Patient will be called back with information.

## 2014-02-27 NOTE — Telephone Encounter (Signed)
Back pain is probably musculoskeletal, defer to PCP. Should stay on Vytorin - the combination is 10 mg zetia and 20 mg simvastatin. The zetia does not cause muscle pain. So it makes no sense that switch to simvastatin alone would relieve his muscle pain. I do not think it is related to his medication.  Dr. Rennis Golden

## 2014-02-27 NOTE — Telephone Encounter (Signed)
I don't treat back pain - he could see an orthopedist or neurosurgeon if he doesn't think his PCP will be helpful.  He needs more than 20 mg of simvastatin - the FDA won't allow 40 mg dose with his meds. Would recommend atorvastatin 40 mg daily.  Dr. Rennis Golden

## 2014-02-28 MED ORDER — ATORVASTATIN CALCIUM 40 MG PO TABS: 40.0000 mg | ORAL_TABLET | Freq: Every day | ORAL | Status: DC

## 2014-02-28 NOTE — Telephone Encounter (Signed)
Patient notified that Dr. Rennis Golden would like to change him to atorvastatin  QD. Patient is agreeable. Rx was sent to pharmacy electronically.

## 2014-02-28 NOTE — Telephone Encounter (Signed)
LMTCB

## 2014-03-26 ENCOUNTER — Telehealth: Payer: Self-pay | Admitting: Internal Medicine

## 2014-03-26 NOTE — Telephone Encounter (Signed)
Pt called in stating that he has been taking steriods for the past 10 days and has noticed that his BP has been elevated and would like to have some A1C labs drawn. Please call  Thanks

## 2014-03-26 NOTE — Telephone Encounter (Signed)
Spoke with patient   patient states he went to an urgent care recently - received z-pak,  and prednisone for bronchitis.  he states since taking medication his blood sugar has been elevated. Range 226 -354.  RN informed patient , he will have to contact the physician who manages his diabetic medication.  RN informed him Dr Rennis GoldenHilty does not follow diabetes. He verbalized understanding.

## 2014-04-11 ENCOUNTER — Telehealth: Payer: Self-pay | Admitting: Internal Medicine

## 2014-04-11 MED ORDER — AMLODIPINE BESYLATE 5 MG PO TABS: 5.0000 mg | ORAL_TABLET | Freq: Every day | ORAL | Status: DC

## 2014-04-11 MED ORDER — METOPROLOL TARTRATE 50 MG PO TABS: 50.0000 mg | ORAL_TABLET | Freq: Two times a day (BID) | ORAL | Status: DC

## 2014-04-11 NOTE — Telephone Encounter (Signed)
Rx was sent to pharmacy electronically. 

## 2014-04-11 NOTE — Telephone Encounter (Signed)
Pt called in stating that his Metoprolol prescription is being denied. He would like to know why. Please call  Thanks

## 2014-04-24 LAB — HM DIABETES EYE EXAM

## 2014-05-23 ENCOUNTER — Encounter: Payer: Self-pay | Admitting: Internal Medicine

## 2014-05-24 ENCOUNTER — Encounter (HOSPITAL_COMMUNITY): Payer: Self-pay | Admitting: Cardiovascular Disease

## 2014-07-31 ENCOUNTER — Ambulatory Visit: Payer: Managed Care, Other (non HMO) | Admitting: Internal Medicine

## 2014-09-06 ENCOUNTER — Other Ambulatory Visit: Payer: Self-pay | Admitting: Cardiology

## 2014-09-06 NOTE — Telephone Encounter (Signed)
Rx(s) sent to pharmacy electronically.  

## 2014-09-30 ENCOUNTER — Other Ambulatory Visit: Payer: Self-pay | Admitting: Internal Medicine

## 2014-10-01 NOTE — Telephone Encounter (Signed)
Rx(s) sent to pharmacy electronically.  

## 2014-10-10 ENCOUNTER — Other Ambulatory Visit: Payer: Self-pay | Admitting: Internal Medicine

## 2014-10-10 NOTE — Telephone Encounter (Signed)
Rx(s) sent to pharmacy electronically.  

## 2014-11-29 ENCOUNTER — Other Ambulatory Visit: Payer: Self-pay | Admitting: Internal Medicine

## 2014-11-29 NOTE — Telephone Encounter (Signed)
Rx(s) sent to pharmacy electronically.  

## 2014-12-03 ENCOUNTER — Other Ambulatory Visit: Payer: Self-pay | Admitting: Internal Medicine

## 2014-12-03 NOTE — Telephone Encounter (Signed)
Rx(s) sent to pharmacy electronically. Staff message sent to Regina, Dr. Hilty's scheduler, to contact patient for appointment   

## 2014-12-05 ENCOUNTER — Telehealth: Payer: Self-pay | Admitting: Internal Medicine

## 2014-12-05 NOTE — Telephone Encounter (Signed)
Close encounter 

## 2014-12-06 ENCOUNTER — Telehealth: Payer: Self-pay | Admitting: Internal Medicine

## 2014-12-06 NOTE — Telephone Encounter (Signed)
Please call,concerning his Metoprolol. His pharmacist says he can not get no more than 90 pills a year.

## 2014-12-06 NOTE — Telephone Encounter (Signed)
Spoke with patient who reports he was told [facility] [place] that he could only get 90 tabs/year of metoprolol. He reports something about [facility] and states she tried to reach out to them (could not follow him very well regarding this). Informed patient that sometimes insurance companies require a change to mail order pharmacy and only allot a certain # of tablets to be obtained from local pharmacy. Patient reports that his metoprolol is normally around $2 for a month's supply and he was told a few days that it was $45/month.   [facility] [place] and they report the patient has not picked up medications from their location in a while [facility] [place] and was informed that medication is ready for pick up for 30 day supply for $1.90  Called patient and informed him of this. Advised that he should call [facility] [place] to check on medication and he should follow up with insurance company regarding the issue with the "90 tablets"

## 2014-12-15 ENCOUNTER — Other Ambulatory Visit: Payer: Self-pay | Admitting: Internal Medicine

## 2014-12-18 NOTE — Telephone Encounter (Signed)
Rx(s) sent to pharmacy electronically.  

## 2014-12-26 ENCOUNTER — Encounter: Payer: Self-pay | Admitting: Internal Medicine

## 2014-12-26 ENCOUNTER — Ambulatory Visit (INDEPENDENT_AMBULATORY_CARE_PROVIDER_SITE_OTHER): Payer: BLUE CROSS/BLUE SHIELD | Admitting: Internal Medicine

## 2014-12-26 VITALS — BP 122/80 | HR 77 | Ht 70.0 in | Wt 217.5 lb

## 2014-12-26 DIAGNOSIS — I10 Essential (primary) hypertension: Secondary | ICD-10-CM | POA: Diagnosis not present

## 2014-12-26 DIAGNOSIS — I480 Paroxysmal atrial fibrillation: Secondary | ICD-10-CM

## 2014-12-26 DIAGNOSIS — E785 Hyperlipidemia, unspecified: Secondary | ICD-10-CM

## 2014-12-26 DIAGNOSIS — Z720 Tobacco use: Secondary | ICD-10-CM

## 2014-12-26 DIAGNOSIS — I2510 Atherosclerotic heart disease of native coronary artery without angina pectoris: Secondary | ICD-10-CM | POA: Diagnosis not present

## 2014-12-26 DIAGNOSIS — I2583 Coronary atherosclerosis due to lipid rich plaque: Secondary | ICD-10-CM

## 2014-12-26 DIAGNOSIS — K219 Gastro-esophageal reflux disease without esophagitis: Secondary | ICD-10-CM | POA: Diagnosis not present

## 2014-12-26 MED ORDER — PANTOPRAZOLE SODIUM 40 MG PO TBEC: 40.0000 mg | DELAYED_RELEASE_TABLET | Freq: Every day | ORAL | Status: DC

## 2014-12-26 NOTE — Patient Instructions (Signed)
Your physician wants you to follow-up in: 1 year with Dr. Hilty. You will receive a reminder letter in the mail two months in advance. If you don't receive a letter, please call our office to schedule the follow-up appointment.  

## 2014-12-26 NOTE — Progress Notes (Signed)
OFFICE NOTE  Chief Complaint:  Routine follow-up  Primary Care Physician: Eloisa NorthernAMIN, SAAD, MD  HPI:  Matthew Horton is a 49 y.o. male with PMH of HTN, DM-2, HLD, OSA & long-standing tobacco abuse with known moderate coronary disease by catheterization in March of 2014 for an abnormal stress test. At that time he shown to have moderate two-vessel disease in the diagonal branch of the LAD as well as the RCA. There was also an ostial OM1 lesion. An FFR of the RCA 70% lesion wasn't not significant. Medical therapy was recommended, The patient continued to have intermittent episodes of chest discomfort since that time. Despite being on aggressive therapy, he presented again on the morning of March 21, 2013 with substernal chest pain like a brick sitting on his chest. This was relieved by nitroglycerin. His EKG demonstrated no acute changes. He was admitted and ruled out for MI with negative troponins x 3. However, considering his history and high likelihood for progression of disease, a relook cath was recommended. This was performed by Dr. Herbie BaltimoreHarding, via the right radial artery. He was found to have severe 2 site CAD in the mid RCA and mid PDA, demonstrating significant progression of disease since March of 2014. Both lesions were treated with DES. He had normal LVEF and EDP. He tolerated the procedure well but was noted to have post PCI atrial fibrillation with RVR. He left the cath lab in stable condition. He had no further A-fib on telemetry. He remained CP free. The right radial access site remained stable. His labs were stable. He did have mild hypertension and was started on a low dose ACE-I. He was resumed on his BB and statin. He was placed on DAPT with ASA and Brilinta.  He returns today in follow-up and is feeling well. His radial cath site has a good pulse and there is good distal sensation and perfusion. He has had no further chest pain and no significant DOE.  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  He reports that he has stopped smoking with this recent hospitalization.  Matthew Horton was recently hospitalized again for chest pain, he underwent repeat cardiac catheterization which showed the folllowing:  Normal LV function   Two-vessel cardiac disease with previously noted 7080% stenosis in the diagonal branch of the LAD the very small superior branch of this diagonal vessel with 95% ostial stenosis and evidence for 30% proximal LAD narrowing as well as 80% mid LAD intramyocardial segment with mid systolic bridging which did improve following IC nitroglycerin administration; 30% narrowing in the distal circumflex marginal vessel; and widely patent mid and distal RCA stents with distal inferior LV branch diffuse stenoses/spasm and a small distal vessel   RECOMMENDATION:  Previously, the patient has been demonstrated to have diagonal stenoses with normal FFR. The present diagonal stenosis appears 80% but visually is the same as previously. He also has evidence for mid systolic bridging of the mid LAD and spasm/diffuse stenoses of an inferior distal branch of his RCA. The previously placed RCA stents are widely patent. The patient will be treated with more aggressive medical regimen including nitrates as well as amlodipine. Smoking cessation is imperative. Consideration for nuclear imaging to assess for anterolateral ischemia may be helpful if recurrent symptomatology develops.  He followed up in our office postcardiac catheterization and reported some persistent sharp electric quick chest pains in the upper midepigastric area. He was  started on low-dose amlodipine for possible coronary spasm and he reported initially his symptoms improved significantly however recently they picked up again. His EKG shows no acute changes. He has not taken nitroglycerin because it "makes him feel bad".   I saw Matthew Horton back in the office today. He  reports in March of this year he went to see another cardiologist, Dr. Hedy Jacob at [facility], for a second opinion regarding persistent chest discomfort. Dr. Hedy Jacob felt that this was atypical for coronary artery disease and ordered a treadmill stress test. This was abnormal which led to a stress echocardiogram. This also was interpreted as mildly abnormal and ultimately he had a repeat cardiac catheterization. Per Matthew Horton's report this was negative for any new obstructive coronary disease. He was placed then on omeprazole and his symptoms improved almost immediately. I've surged on care everywhere for the cath report however cannot seem to find it. We will request those records from [facility].  PMHx:  Past Medical History  Diagnosis Date  . HTN (hypertension)   . DM2 (diabetes mellitus, type 2)   . CKD (chronic kidney disease) stage 2, GFR 60-89 ml/min   . HLD (hyperlipidemia)   . Tobacco abuse   . Cluster headache   . OSA (obstructive sleep apnea)   . Low testosterone   . Nonallopathic lesion of cervical region   . Torticollis, acute   . Coronary artery disease     a. s/p DES x 2 to RCA 03/2013 - residual disease for med rx.  . Atrial fibrillation     a. Transient during adm 03/2013 for PCI.     Past Surgical History  Procedure Laterality Date  . Tonsillectomy    . Traumatic amputation and reattachment Left     Index finger  . Coronary angioplasty with stent placement  03/22/2013    "2 stents", Dr. Ranae Palms  . Cardiac catheterization  08/08/2013  . Left heart catheterization with coronary angiogram N/A 08/16/2012    Procedure: LEFT HEART CATHETERIZATION WITH CORONARY ANGIOGRAM;  Surgeon: Runell Gess, MD;  Location: [facility] CATH LAB;  Service: Cardiovascular;  Laterality: N/A;  . Left heart catheterization with coronary angiogram N/A 03/22/2013    Procedure: LEFT HEART CATHETERIZATION WITH CORONARY ANGIOGRAM;  Surgeon: Marykay Lex, MD;  Location: [facility] CATH LAB;   Service: Cardiovascular;  Laterality: N/A;  . Fractional flow reserve wire Right 03/22/2013    Procedure: FRACTIONAL FLOW RESERVE WIRE;  Surgeon: Marykay Lex, MD;  Location: [facility] CATH LAB;  Service: Cardiovascular;  Laterality: Right;  . Percutaneous coronary stent intervention (pci-s) Right 03/22/2013    Procedure: PERCUTANEOUS CORONARY STENT INTERVENTION (PCI-S);  Surgeon: Marykay Lex, MD;  Location: [facility] CATH LAB;  Service: Cardiovascular;  Laterality: Right;  . Left heart catheterization with coronary angiogram N/A 08/08/2013    Procedure: LEFT HEART CATHETERIZATION WITH CORONARY ANGIOGRAM;  Surgeon: Lennette Bihari, MD;  Location: [facility] CATH LAB;  Service: Cardiovascular;  Laterality: N/A;    FAMHx:  Family History  Problem Relation Age of Onset  . Coronary artery disease Father 88    MI and s/p CABG  . Diabetes Father   . Hypertension Father   . Hyperlipidemia Father   . Hyperlipidemia Mother     SOCHx:   reports that he quit smoking about 17 months ago. His smoking use included Cigarettes. He has a 30 pack-year smoking history. He does not have any smokeless tobacco history on file. He reports that he does not  drink alcohol or use illicit drugs.  ALLERGIES:  Allergies  Allergen Reactions  . Ranexa [Ranolazine] Nausea Only and Other (See Comments)    dizziness    ROS: A comprehensive review of systems was negative except for: Gastrointestinal: positive for reflux symptoms  HOME MEDS: Current Outpatient Prescriptions  Medication Sig Dispense Refill  . acetaminophen (TYLENOL) 325 MG tablet Take 1-2 tablets (325-650 mg total) by mouth every 4 (four) hours as needed for mild pain or moderate pain.    Marland Kitchen amLODipine (NORVASC) 5 MG tablet Take 1 tablet (5 mg total) by mouth daily. 30 tablet 7  . aspirin EC 81 MG tablet Take 81 mg by mouth every morning.     Marland Kitchen atorvastatin (LIPITOR) 40 MG tablet Take 1 tablet (40 mg total) by mouth daily at 6 PM. 30 tablet 0  . BRILINTA 90 MG TABS  tablet TAKE 1 TABLET (90 MG TOTAL) BY MOUTH 2 (TWO) TIMES DAILY. 60 tablet 6  . lisinopril (PRINIVIL,ZESTRIL) 10 MG tablet Take 10 mg by mouth daily.  2  . metoprolol (LOPRESSOR) 50 MG tablet TAKE 1 TABLET (50 MG TOTAL) BY MOUTH 2 (TWO) TIMES DAILY. 60 tablet 6  . nitroGLYCERIN (NITROSTAT) 0.4 MG SL tablet Place 1 tablet (0.4 mg total) under the tongue every 5 (five) minutes as needed for chest pain. 25 tablet 2  . omeprazole (PRILOSEC OTC) 20 MG tablet Take 20 mg by mouth daily.    . SitaGLIPtin-MetFORMIN HCl (JANUMET XR) 50-1000 MG TB24 Take 1 tablet by mouth daily.     No current facility-administered medications for this visit.    LABS/IMAGING: No results found for this or any previous visit (from the past 48 hour(s)). No results found.  VITALS: BP 122/80 mmHg  Pulse 77  Ht 5\' 10"  (1.778 m)  Wt 217 lb 8 oz (98.657 kg)  BMI 31.21 kg/m2  EXAM: GEN: Awake, NAD HEENT: PERRLA, EOMI Prevascular: RRR, NL S1/S2 Lungs: Clear bilaterally Abdomen: Soft, nontender, positive bowel sounds Extremities: No edema Neurologic: Grossly nonfocal Psych: Mildly anxious  EKG: Normal sinus rhythm at 77  ASSESSMENT: 1. CAD s/p PCI to the mid-RCA and distal PDA with DES (03/2013) -  patent stents by cardiac catheterization at [facility] in 08/2014 2. HTN - controlled 3. Hyperlipidemia - on zocor 4. Tobacco dependence - has quit with recent hospitalization 5. Abnormal NST 6. Atypical chest pain 7. GERD   PLAN: 1.   Mr. Breighner had a repeat ischemia workup which was abnormal with regards to a treadmill and stress echo. His catheterization hour failed to show any obstructive coronary artery disease. He did respond to the addition of Prilosec to his regimen for GERD. Unfortunately his insurance would not cover that he's currently taking over-the-counter omeprazole which he says doesn't work quite as well. I recommend he go on to pantoprazole and will put an order through for that.  Chrystie Nose, MD,  [facility] Attending Cardiologist [facility]  Lisette Abu Hilty 12/26/2014, 11:11 AM

## 2014-12-27 ENCOUNTER — Telehealth: Payer: Self-pay | Admitting: Internal Medicine

## 2014-12-27 NOTE — Telephone Encounter (Signed)
Fax[facility] for Dr Rennis GoldenHilty.  Faxed on 12/27/14. lp

## 2014-12-31 ENCOUNTER — Telehealth: Payer: Self-pay | Admitting: Internal Medicine

## 2014-12-31 NOTE — Telephone Encounter (Signed)
[facility] as requested by Dr Rennis GoldenHilty. Given to Dr Rennis GoldenHilty to review. lp

## 2015-01-01 ENCOUNTER — Telehealth: Payer: Self-pay | Admitting: *Deleted

## 2015-01-01 NOTE — Telephone Encounter (Signed)
Faxed PA for pantoprazole 40mg  QD to [facility] @ [phone]

## 2015-01-05 ENCOUNTER — Other Ambulatory Visit: Payer: Self-pay | Admitting: Internal Medicine

## 2015-01-07 ENCOUNTER — Telehealth: Payer: Self-pay | Admitting: *Deleted

## 2015-01-07 NOTE — Telephone Encounter (Signed)
LM for patient to call back.   Per Dr. Blanchie Dessert review of cath report from [facility], patient has small vessel disease, not amendable to PCI. Should be on dual antiplatelet therapy for medical management of CAD. OK to change from Brilinta  BID to clopidogrel  once daily.

## 2015-01-07 NOTE — Telephone Encounter (Signed)
Rx(s) sent to pharmacy electronically.  

## 2015-01-08 MED ORDER — CLOPIDOGREL BISULFATE 75 MG PO TABS: 75.0000 mg | ORAL_TABLET | Freq: Every day | ORAL | Status: DC

## 2015-01-08 NOTE — Telephone Encounter (Signed)
Spoke to patient   information given to patient ---can stop Brilinta 90 mg and start clopidogrel 75 mg daily.  E-sent to pharmcy -[facility] [id number]  Patient is also taking omeprazole as well - will defer to Dr Rennis Golden- to see if any medication change is needed

## 2015-01-08 NOTE — Telephone Encounter (Signed)
Disregard message about PPI - patient was on omeprazole but changed to pantoprazole after last OV.

## 2015-01-08 NOTE — Telephone Encounter (Signed)
Pt is returning Jenna's call from yesterday about a possible medication change. Please f/u  Thanks

## 2015-01-11 NOTE — Telephone Encounter (Signed)
Pantoprazole approved for $8/month

## 2015-03-08 ENCOUNTER — Telehealth: Payer: Self-pay | Admitting: Internal Medicine

## 2015-03-08 NOTE — Telephone Encounter (Signed)
Would be okay to hold plavix for 5 days and aspirin for 7 days prior if requested by urologist for the procedure.  DR.  Rexene Edison

## 2015-03-08 NOTE — Telephone Encounter (Signed)
Patient called this morning and said he might have a scope today by Dr. Harvin Hazel with [facility] for blockage in Kidney.  Patient concerned since he is on Olacix and ASA and also has a stent (03/2013)  I said the most important thing is to let the urologist know that you are taking Plavix and ASA.  Will route to Dr. Rennis Golden

## 2015-03-08 NOTE — Telephone Encounter (Signed)
Pt called and said he might have a scope today,because his kidney is blocked.He is concerned because he has a stent,also on Plavix.He wants to know if there are any particular instructions he needs to follow.Please call asap to advise.

## 2015-03-08 NOTE — Telephone Encounter (Signed)
Gave wife the message that it would be ok to hold plavix for 5 days and ASA for 7 days prior if requested by urologist.

## 2015-03-21 ENCOUNTER — Other Ambulatory Visit: Payer: Self-pay | Admitting: Internal Medicine

## 2015-03-21 NOTE — Telephone Encounter (Signed)
Rx request sent to pharmacy.  

## 2015-03-26 IMAGING — CR DG CHEST 2V
2 series · 2 of 2 positions shown · non-contrast
Comparison: None.

CLINICAL DATA: Chest pain, dizziness, shortness of breath

EXAM:
CHEST  2 VIEW

[w chest pa]
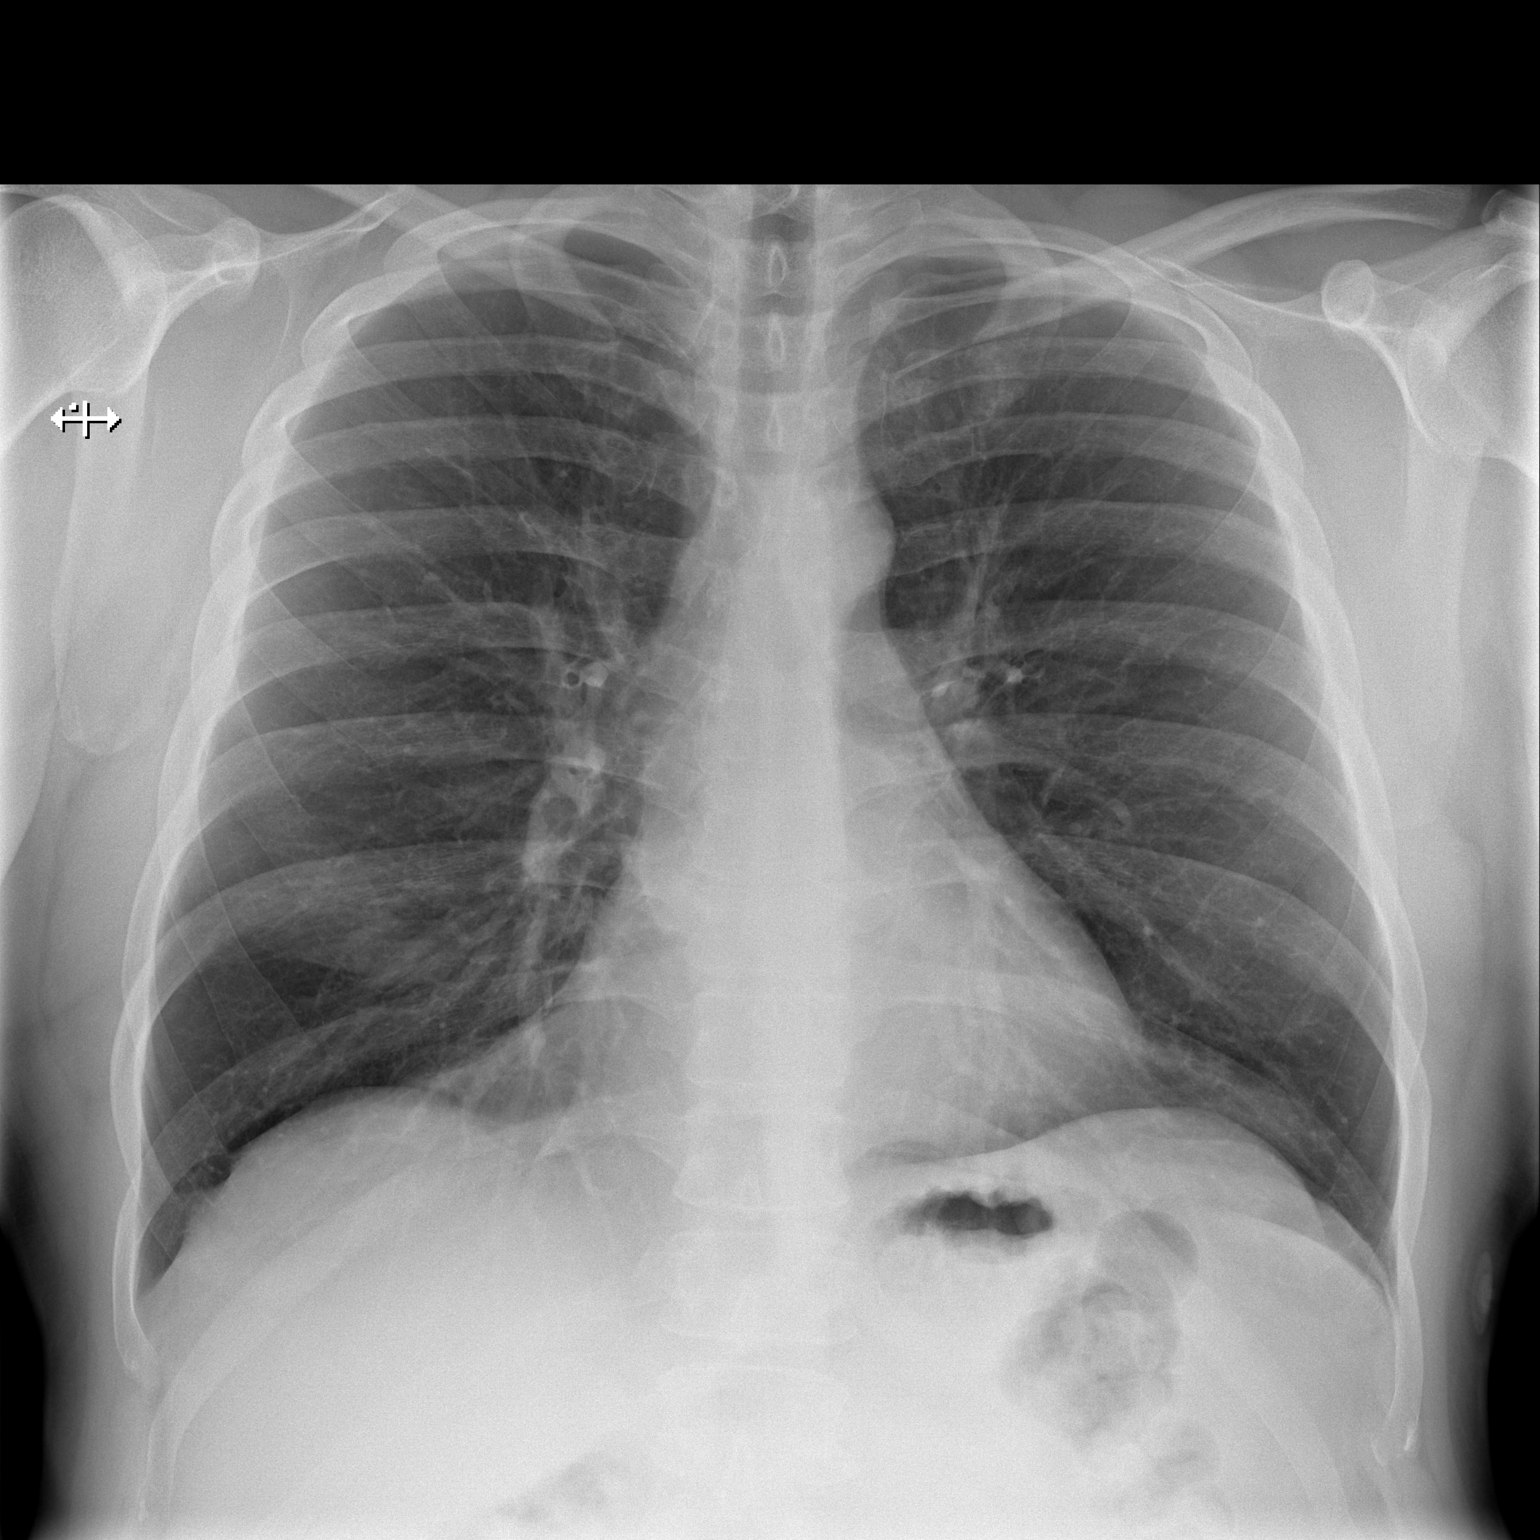

[w chest lat]
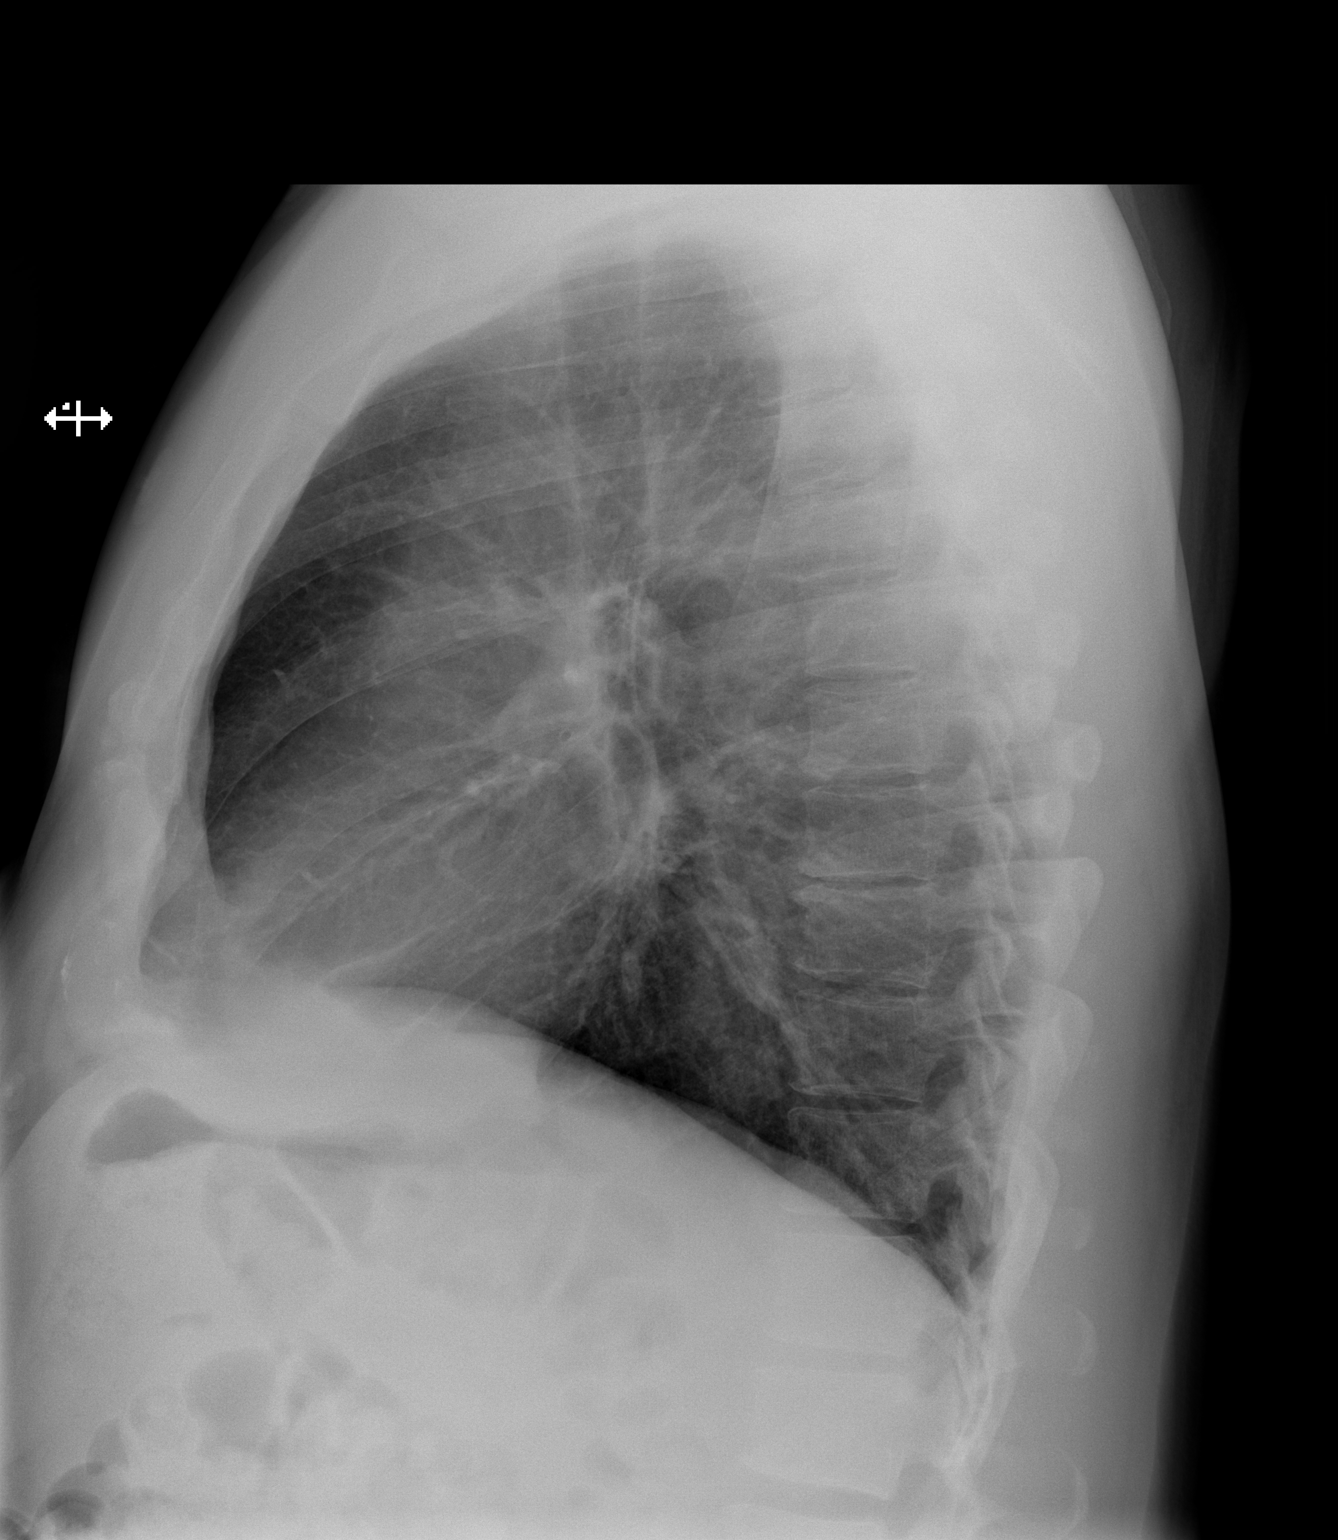

[2 of 2 positions shown; findings below may reference images not displayed]

FINDINGS: The heart size and mediastinal contours are within normal limits. No
acute infiltrate or pulmonary edema. Mild degenerative changes lower
thoracic spine.
IMPRESSION: No active cardiopulmonary disease.

## 2015-06-24 ENCOUNTER — Other Ambulatory Visit: Payer: Self-pay | Admitting: Internal Medicine

## 2015-06-24 ENCOUNTER — Other Ambulatory Visit: Payer: Self-pay | Admitting: *Deleted

## 2015-06-24 MED ORDER — METOPROLOL TARTRATE 50 MG PO TABS: 50.0000 mg | ORAL_TABLET | Freq: Two times a day (BID) | ORAL | Status: DC

## 2015-06-24 NOTE — Telephone Encounter (Signed)
Rx(s) sent to pharmacy electronically.  

## 2015-07-10 ENCOUNTER — Telehealth: Payer: Self-pay | Admitting: Internal Medicine

## 2015-07-10 ENCOUNTER — Ambulatory Visit (INDEPENDENT_AMBULATORY_CARE_PROVIDER_SITE_OTHER): Payer: BLUE CROSS/BLUE SHIELD | Admitting: Physician Assistant

## 2015-07-10 ENCOUNTER — Encounter: Payer: Self-pay | Admitting: Physician Assistant

## 2015-07-10 VITALS — BP 124/74 | HR 82 | Ht 70.0 in | Wt 217.0 lb

## 2015-07-10 DIAGNOSIS — I208 Other forms of angina pectoris: Secondary | ICD-10-CM

## 2015-07-10 DIAGNOSIS — I10 Essential (primary) hypertension: Secondary | ICD-10-CM | POA: Diagnosis not present

## 2015-07-10 DIAGNOSIS — I25119 Atherosclerotic heart disease of native coronary artery with unspecified angina pectoris: Secondary | ICD-10-CM

## 2015-07-10 DIAGNOSIS — E785 Hyperlipidemia, unspecified: Secondary | ICD-10-CM

## 2015-07-10 DIAGNOSIS — K219 Gastro-esophageal reflux disease without esophagitis: Secondary | ICD-10-CM

## 2015-07-10 MED ORDER — ISOSORBIDE MONONITRATE ER 30 MG PO TB24: 30.0000 mg | ORAL_TABLET | Freq: Every day | ORAL | Status: DC

## 2015-07-10 MED ORDER — PANTOPRAZOLE SODIUM 40 MG PO TBEC: 40.0000 mg | DELAYED_RELEASE_TABLET | Freq: Every day | ORAL | Status: DC

## 2015-07-10 MED ORDER — NITROGLYCERIN 0.4 MG SL SUBL: 0.4000 mg | SUBLINGUAL_TABLET | SUBLINGUAL | Status: DC | PRN

## 2015-07-10 NOTE — Telephone Encounter (Signed)
Spoke with pt, every morning he after getting up he will develop a tightness in his chest and SOB. The chest tightness can last up to 2 to 4 hours and exertion will make it worse. He does not have NTG. The SOB will linger throughout the day. This is similar than his previous heart pain but with previous he also had palpitations, he not having any at this time. He would like to be seen today. Follow up scheduled

## 2015-07-10 NOTE — Progress Notes (Signed)
Cardiology Office Note:    Date:  07/10/2015   ID:  Matthew Horton, DOB [birth year], MRN [id number]  PCP:  [name], MD  Cardiologist:  Dr. K. [place] Hilty   Electrophysiologist:  n/a  Chief Complaint  Patient presents with  . Chest Pain    History of Present Illness:    Matthew Horton is a 50 y.o. male with a hx of CAD, HTN, DM2, HL, OSA, tobacco abuse. LHC in 3/14 demonstrated 2 vessel CAD with LAD diagonal branch and RCA stenosis. FFR of the RCA was insignificant at that time. He continued to have chest discomfort and he was admitted in 10/14 with recurrent symptoms. There was progression of disease in the RCA. He received DES 2. He developed AF with RVR post PCI. Repeat catheterization in 2/15 demonstrated continued diagonal stenosis and patent stents in the RCA. Medical therapy was continued. He was evaluated at [facility] in 3/16. Abnormal Myoview prompted cardiac catheterization. This again demonstrated high-grade disease in a small superior branch of D1 not amenable to PCI. He had patent stents in the RCA and mild nonobstructive disease elsewhere. Medical therapy was continued. Last seen by Dr. Rennis Golden 7/16.  Patient called the office this morning with recurrent chest symptoms. He was added on for further evaluation.  He is here today by himself. He is somewhat vague about his symptoms. He's had lots of different types of chest pains. He notes occasional substernal chest tightness. He's noticed this off and on for quite some time. Over the past several days, it seems to have worsened. He notes the discomfort shortly after awakening in the mornings. It does not wake him up. It lasts for 4-5 hours and then subsides. He does feel more out of breath. However, he's had chronic dyspnea with exertion since his first PCI without significant change. He does note worsening symptoms with exertion. He denies orthopnea, PND or edema. He denies syncope. He denies pleuritic chest pain or chest pain with  lying supine. He was previously on PPI therapy with improved symptoms. He is no longer on Protonix for unclear reasons.   Past Medical History  Diagnosis Date  . HTN (hypertension)   . DM2 (diabetes mellitus, type 2) (HCC)   . CKD (chronic kidney disease) stage 2, GFR 60-89 ml/min   . HLD (hyperlipidemia)   . Tobacco abuse   . Cluster headache   . OSA (obstructive sleep apnea)   . Low testosterone   . Nonallopathic lesion of cervical region   . Torticollis, acute   . Coronary artery disease     a. s/p DES x 2 to RCA 03/2013 - residual disease for med rx.  . Atrial fibrillation (HCC)     a. Transient during adm 03/2013 for PCI.     Past Surgical History  Procedure Laterality Date  . Tonsillectomy    . Traumatic amputation and reattachment Left     Index finger  . Coronary angioplasty with stent placement  03/22/2013    "2 stents", Dr. Ranae Palms  . Cardiac catheterization  08/08/2013  . Left heart catheterization with coronary angiogram N/A 08/16/2012    Procedure: LEFT HEART CATHETERIZATION WITH CORONARY ANGIOGRAM;  Surgeon: Runell Gess, MD;  Location[facility] C[facility] LAB;  Service: Cardiovascular;  Laterality: N/A;  . Left heart catheterization with coronary angiogram N/A 03/22/2013    Procedure: LEFT HEART CATHETERIZATION WITH CORONARY ANGIOGRAM;  Surgeon: Marykay Lex, MD;  Location: [facility] CATH LAB;  Service: Cardiovascular;  Laterality:  N/A;  . Fractional flow reserve wire Right 03/22/2013    Procedure: FRACTIONAL FLOW RESERVE WIRE;  Surgeon: Marykay Lex, MD;  Location: [facility] CATH LAB;  Service: Cardiovascular;  Laterality: Right;  . Percutaneous coronary stent intervention (pci-s) Right 03/22/2013    Procedure: PERCUTANEOUS CORONARY STENT INTERVENTION (PCI-S);  Surgeon: Marykay Lex, MD;  Location: [facility] CATH LAB;  Service: Cardiovascular;  Laterality: Right;  . Left heart catheterization with coronary angiogram N/A 08/08/2013    Procedure: LEFT HEART CATHETERIZATION WITH CORONARY  ANGIOGRAM;  Surgeon: Lennette Bihari, MD;  Location: [facility] CATH LAB;  Service: Cardiovascular;  Laterality: N/A;    Current Medications: Outpatient Prescriptions Prior to Visit  Medication Sig Dispense Refill  . acetaminophen (TYLENOL) 325 MG tablet Take 1-2 tablets (325-650 mg total) by mouth every 4 (four) hours as needed for mild pain or moderate pain.    Marland Kitchen amLODipine (NORVASC) 5 MG tablet Take 1 tablet (5 mg total) by mouth daily. 180 tablet 0  . aspirin EC 81 MG tablet Take 81 mg by mouth every morning.     Marland Kitchen atorvastatin (LIPITOR) 40 MG tablet Take 1 tablet (40 mg total) by mouth daily at 6 PM. 30 tablet 0  . clopidogrel (PLAVIX) 75 MG tablet Take 1 tablet (75 mg total) by mouth daily. 90 tablet 3  . lisinopril (PRINIVIL,ZESTRIL) 10 MG tablet Take 10 mg by mouth daily.  2  . metoprolol (LOPRESSOR) 50 MG tablet Take 1 tablet (50 mg total) by mouth 2 (two) times daily. 60 tablet 9  . SitaGLIPtin-MetFORMIN HCl (JANUMET XR) 50-1000 MG TB24 Take 1 tablet by mouth daily.    . nitroGLYCERIN (NITROSTAT) 0.4 MG SL tablet Place 1 tablet (0.4 mg total) under the tongue every 5 (five) minutes as needed for chest pain. 25 tablet 2  . atorvastatin (LIPITOR) 40 MG tablet Take 1 tablet (40 mg total) by mouth daily. 30 tablet 11  . pantoprazole (PROTONIX) 40 MG tablet Take 1 tablet (40 mg total) by mouth daily. 90 tablet 3   No facility-administered medications prior to visit.     Allergies:   Ranexa   Social History   Social History  . Marital Status: Married    Spouse Name: N/A  . Number of Children: N/A  . Years of Education: N/A   Social History Main Topics  . Smoking status: Former Smoker -- 1.00 packs/day for 30 years    Types: Cigarettes    Quit date: 07/16/2013  . Smokeless tobacco: None  . Alcohol Use: No  . Drug Use: No  . Sexual Activity: Not Asked   Other Topics Concern  . None   Social History Narrative   Drive a truck for a living.     Family History:  The patient's  family history includes Coronary artery disease (age of onset: 52) in his father; Diabetes in his father; Hyperlipidemia in his father and mother; Hypertension in his father.   ROS:   Please see the history of present illness.    Review of Systems  Constitution: Positive for chills and malaise/fatigue.  HENT: Positive for headaches.   Cardiovascular: Positive for chest pain and dyspnea on exertion.  Respiratory: Positive for cough and shortness of breath.   Musculoskeletal: Positive for back pain.  Neurological: Positive for dizziness.  All other systems reviewed and are negative.   Physical Exam:    VS:  BP 124/74 mmHg  Pulse 82  Ht 5\' 10"  (1.778 m)  Wt 217 lb (98.431  kg)  BMI 31.14 kg/m2   GEN: Well nourished, well developed, in no acute distress HEENT: normal Neck: no JVD, no masses Cardiac: Normal S1/S2,  RRR; no murmurs, no edema  Respiratory:  clear to auscultation bilaterally; no wheezing, rhonchi or rales GI: soft, nontender, nondistended  MS: no deformity or atrophy Skin: warm and dry, no rash Neuro:   no focal deficits  Psych: Alert and oriented x 3, normal affect  Wt Readings from Last 3 Encounters:  07/10/15 217 lb (98.431 kg)  12/26/14 217 lb 8 oz (98.657 kg)  11/10/13 222 lb 9.6 oz (100.971 kg)      Studies/Labs Reviewed:    EKG:  EKG is  ordered today.  The ekg ordered today demonstrates  NSR, HR 82, normal axis, incomplete RBBB, no ST changes, QTc 467 ms, no change from prior tracing  Recent Labs: No results found for requested labs within last 365 days.   Recent Lipid Panel    Component Value Date/Time   CHOL 167 08/08/2013 0246   TRIG 130 08/08/2013 0246   HDL 36* 08/08/2013 0246   CHOLHDL 4.6 08/08/2013 0246   VLDL 26 08/08/2013 0246   LDLCALC 105* 08/08/2013 0246    Additional studies/ records that were reviewed today include:   LHC at [facility] 3/16 LM ok LAD prox 25%, mid 35%, D1 90% in small superior branch not amenable to PCI LCx  ok RCA stent in mid and distal patent  Myoview 3/16 at [facility] Inferolateral and lateral apical ischemia, inferior peri-infarct ischemia, EF normal  ETT-echo 3/16 at [facility] Normal  Echo 11/13 EF 51-55%   ASSESSMENT:    1. Coronary artery disease involving native coronary artery of native heart with angina pectoris (HCC)   2. Essential hypertension   3. Hyperlipidemia   4. Gastroesophageal reflux disease without esophagitis     PLAN:    In order of problems listed above:  1. CAD - He presents today with complaints of worsening chest discomfort over the past few days.  His ECG is unremarkable. He has had several LHCs in the last couple of years with stable disease. He does have a small Dx branch with 90% stenosis that is not amenable to PCI and med Rx has been recommended.  He previously had improved symptoms with PPI therapy.  I reviewed his case with Dr. Cassell Clement (DOD) today.  The patient has had abnormal nuclear stress tests in the past prompting cardiac cath.  I am not convinced that repeat nuclear stress test will yield helpful information.  Dr. Patty Sermons agreed and we decided cardiac cath would be the best approach to further evaluate.  However, the patient is reluctant to proceed and would like to advance medical Rx first to see if this helps.  This is a reasonable approach.  He does not take PDE-5 inhibitors.  I spent >40 minutes with the patient today evaluating him and coordinating care.  Face to face time > 50%.   -  Start Imdur 30 mg QD.  -  Continue Amlodipine, ASA, Plavix, statin, beta-blocker.  -  Resume PPI therapy  -  Return for close FU in 2 weeks with Dr. Kirtland Bouchard. [place] Hilty.  If symptoms progress or do not improve >> consider LHC at that time.  -  If symptoms worsen, go to the ED.  2. HTN -  Controlled.   3. HL - Continue statin.   4. GERD - Resume Protonix 40 mg QD in case GERD is involved in  his symptoms.      Medication Adjustments/Labs and Tests  Ordered: Current medicines are reviewed at length with the patient today.  Concerns regarding medicines are outlined above.  Medication changes, Labs and Tests ordered today are outlined in the Patient Instructions noted below. Patient Instructions  Medication Instructions:  1. START IMDUR 30 MG DAILY; RX SENT   2. START PROTONIX 40 MG DAILY; RX SENT  3. A REFILL FOR NTG HAS BEEN SENT IN  Labwork: NONE  Testing/Procedures: NONE  Follow-Up: DR. HILTY 2 WEEKS  Any Other Special Instructions Will Be Listed Below (If Applicable). YOU HAVE BEEN ADVISED IF YOUR SYMPTOMS GET WORSE YOU HAVE BEEN ADVISED TO GO TO THE ED  If you need a refill on your cardiac medications before your next appointment, please call your pharmacy.    Signed, [name], PA-C  07/10/2015 5:06 PM    [facility] [address] [place], [place], [place]  [address] Phone: [phone]; Fax: [phone]

## 2015-07-10 NOTE — Patient Instructions (Addendum)
Medication Instructions:  1. START IMDUR 30 MG DAILY; RX SENT   2. START PROTONIX 40 MG DAILY; RX SENT  3. A REFILL FOR NTG HAS BEEN SENT IN  Labwork: NONE  Testing/Procedures: NONE  Follow-Up: DR. HILTY 2 WEEKS  Any Other Special Instructions Will Be Listed Below (If Applicable). YOU HAVE BEEN ADVISED IF YOUR SYMPTOMS GET WORSE YOU HAVE BEEN ADVISED TO GO TO THE ED  If you need a refill on your cardiac medications before your next appointment, please call your pharmacy.

## 2015-07-10 NOTE — Telephone Encounter (Signed)
Pt called in stating that he has been experiencing some SOB, chest tightness, and fatigue. He would like to know if he could be seen today around noon because that is when he gets off of work. 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  Thanks

## 2015-07-17 ENCOUNTER — Ambulatory Visit: Payer: BLUE CROSS/BLUE SHIELD | Admitting: Physician Assistant

## 2015-07-24 ENCOUNTER — Ambulatory Visit: Payer: BLUE CROSS/BLUE SHIELD | Admitting: Physician Assistant

## 2015-07-26 ENCOUNTER — Other Ambulatory Visit: Payer: Self-pay

## 2015-07-26 MED ORDER — LISINOPRIL 10 MG PO TABS: 10.0000 mg | ORAL_TABLET | Freq: Every day | ORAL | Status: DC

## 2015-07-26 MED ORDER — SITAGLIP PHOS-METFORMIN HCL ER 50-1000 MG PO TB24: 1.0000 | ORAL_TABLET | Freq: Every day | ORAL | Status: DC

## 2015-08-12 IMAGING — CR DG CHEST 2V
2 series · 2 of 2 positions shown · non-contrast
Comparison: DG CHEST 2 VIEW dated 03/21/2013

CLINICAL DATA: CHEST PAIN

EXAM:
CHEST  2 VIEW

[w chest pa]
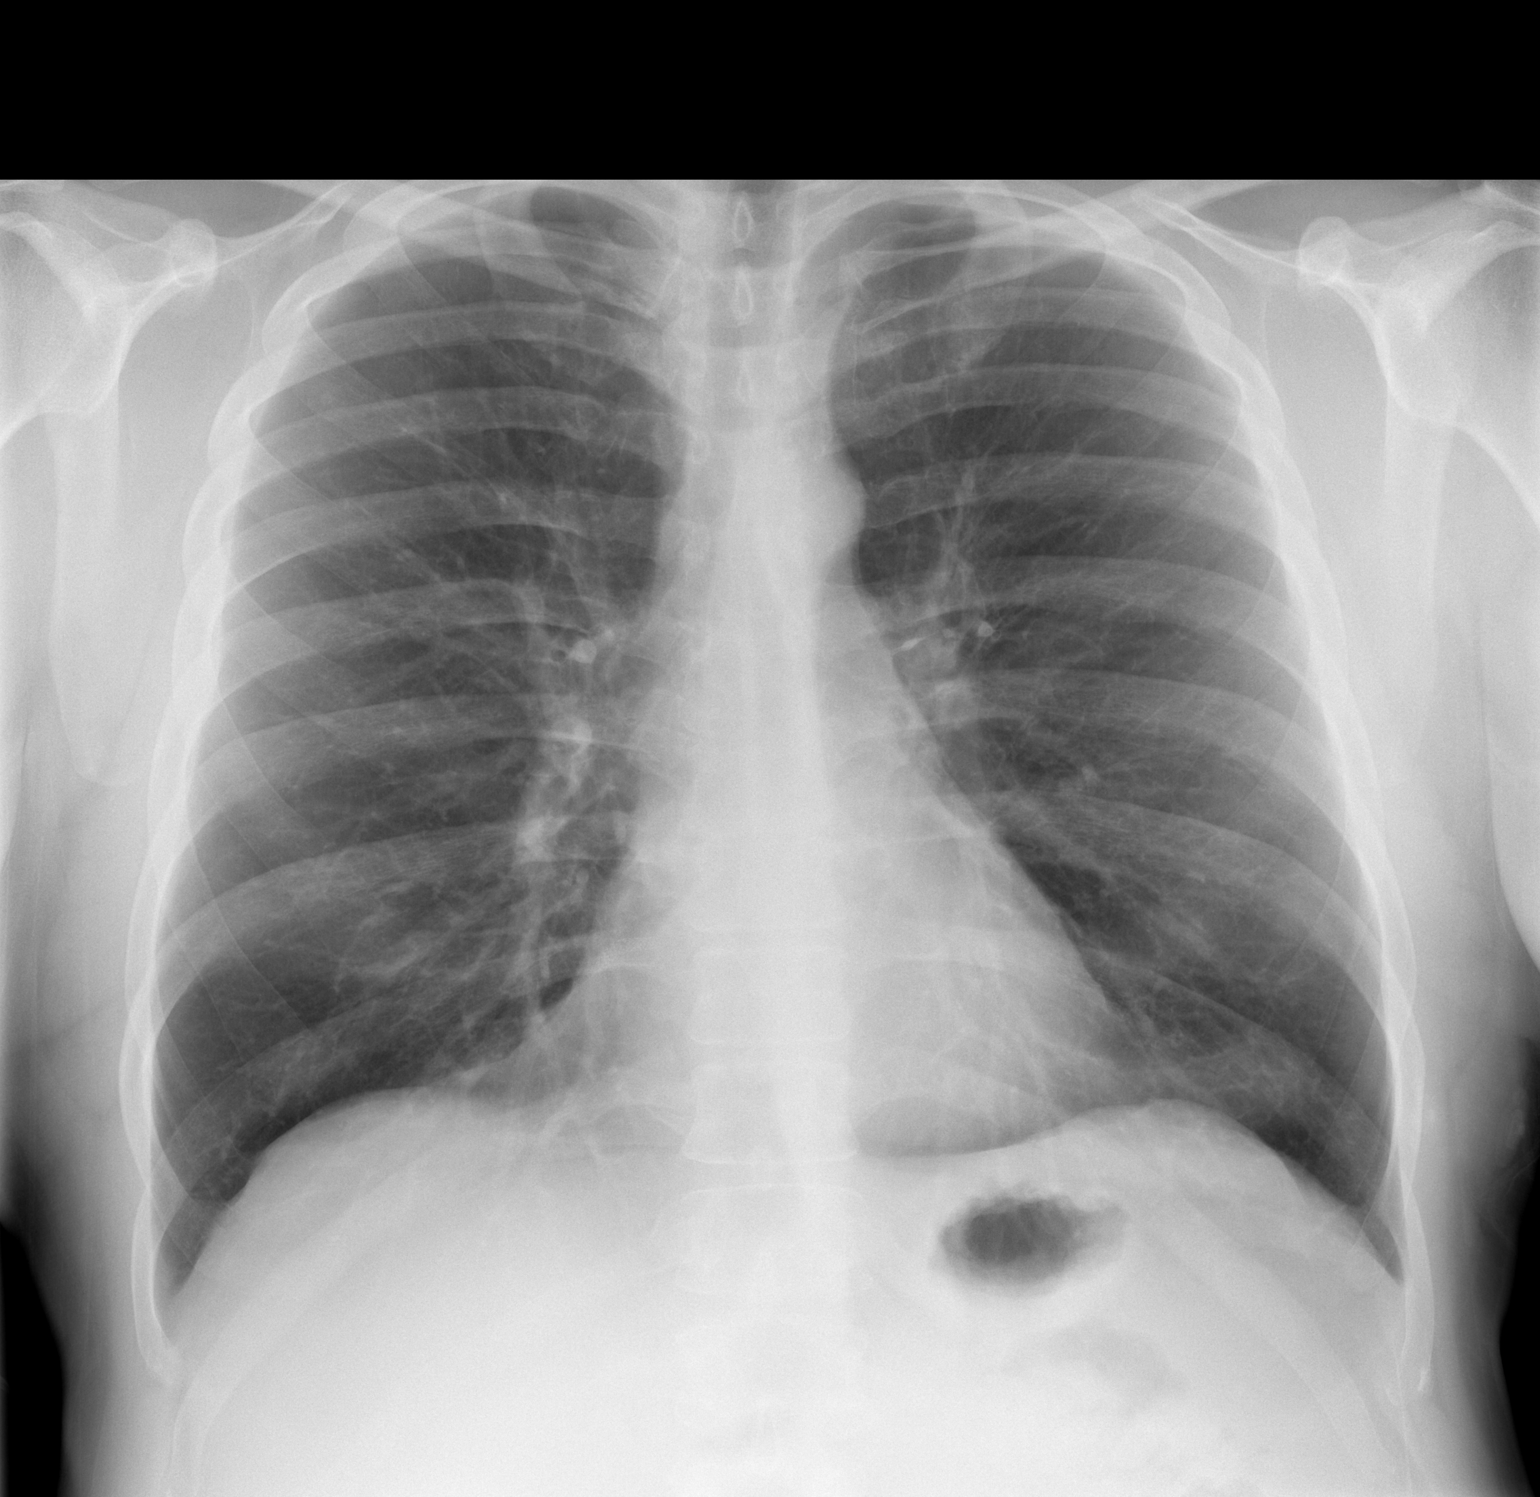

[w chest lat]
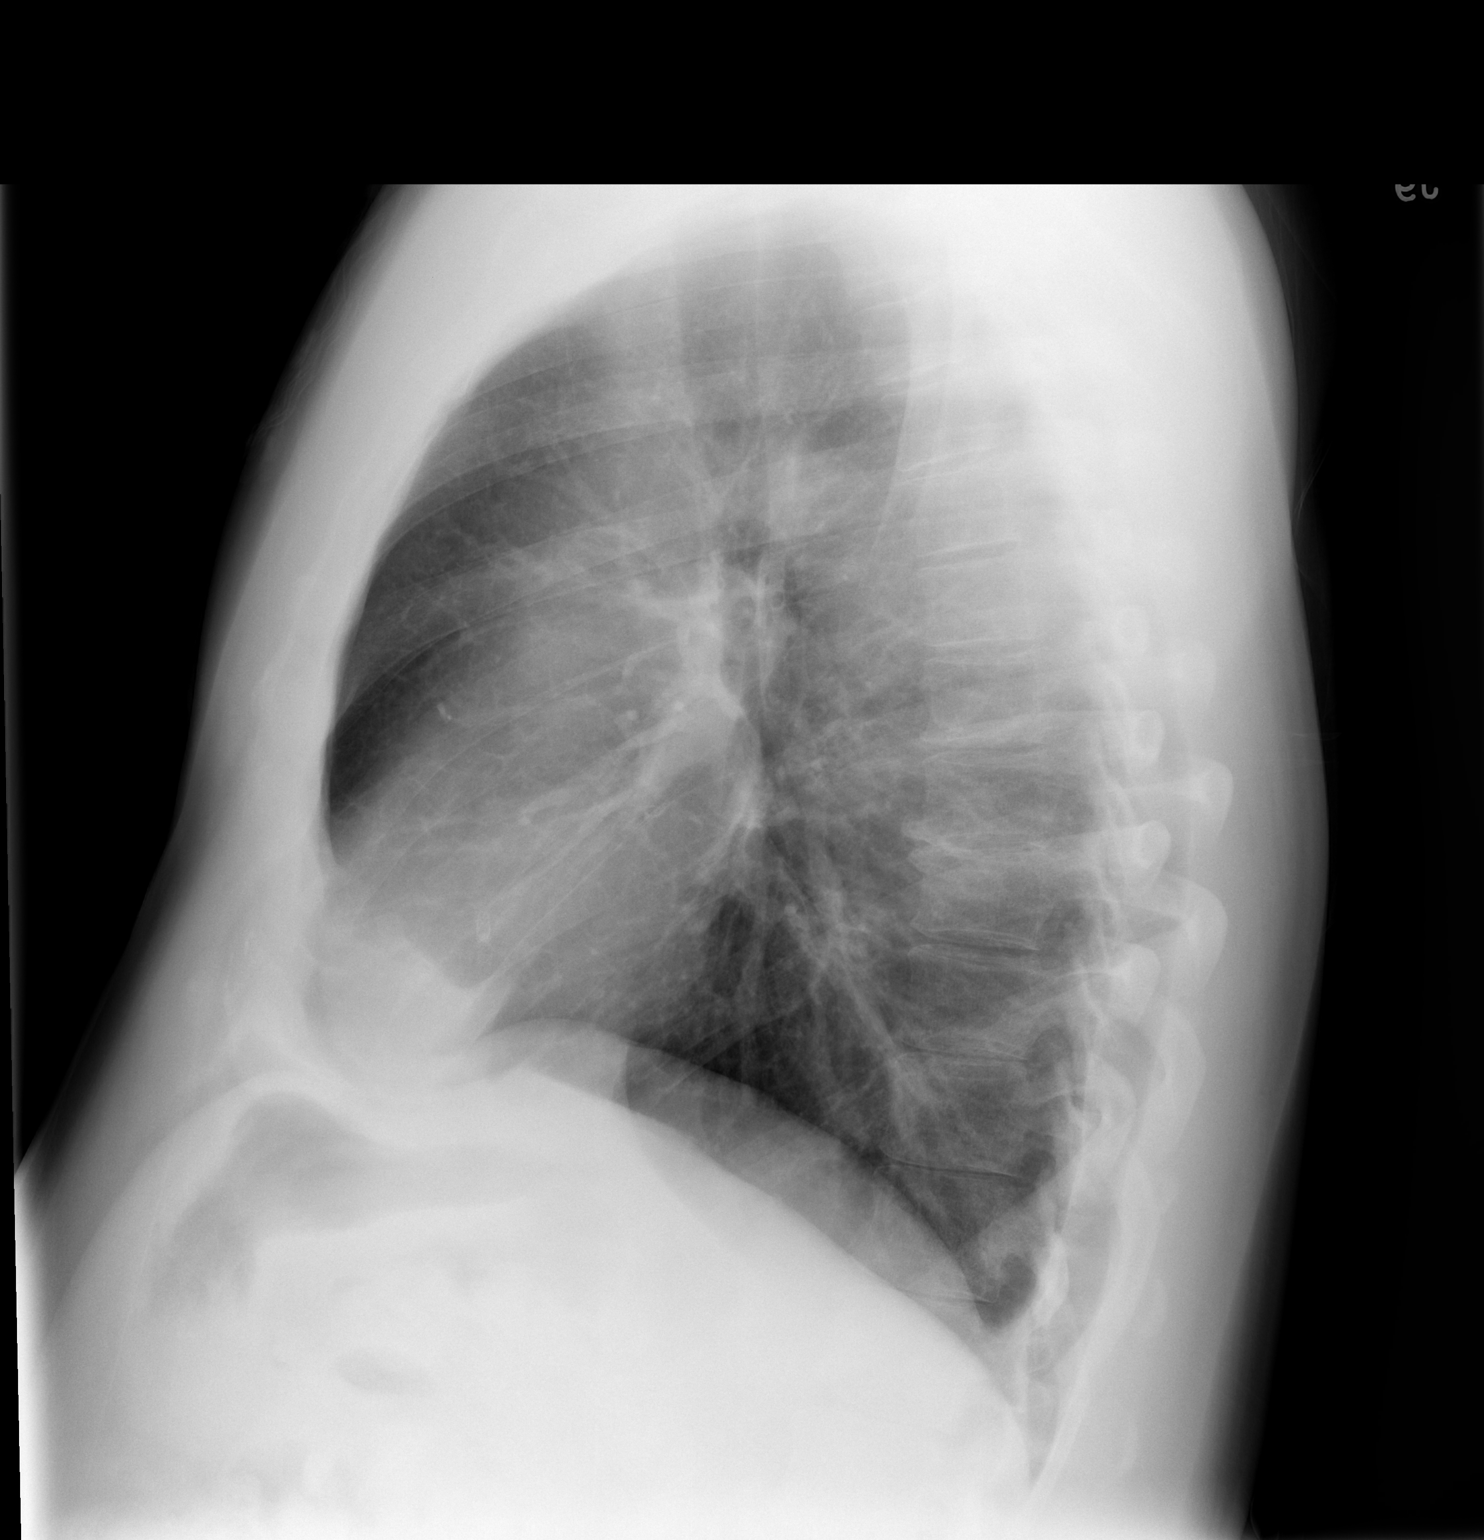

[2 of 2 positions shown; findings below may reference images not displayed]

FINDINGS: The heart size and mediastinal contours are within normal limits.
Both lungs are clear. The visualized skeletal structures are
unremarkable.
IMPRESSION: No active cardiopulmonary disease.

## 2015-08-14 ENCOUNTER — Other Ambulatory Visit: Payer: Self-pay | Admitting: Internal Medicine

## 2015-08-15 NOTE — Telephone Encounter (Signed)
REFILL 

## 2015-10-15 ENCOUNTER — Telehealth: Payer: Self-pay | Admitting: Internal Medicine

## 2015-10-15 NOTE — Telephone Encounter (Signed)
I agree .Marland Kitchen. If symptoms worsen, go to the ER.   Dr. HRexene Edison

## 2015-10-15 NOTE — Telephone Encounter (Signed)
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

## 2015-10-15 NOTE — Telephone Encounter (Signed)
Pt describes general chest pain, arm pain, and back pain, along w/ SOB which started today. Does not know if stress related or o/w, notes he has been under a lot of recent stress w/ job loss/change. Does not attribute this to physical activity though he does not rule this out. Doesn't state worse w/ exertion. Notes he has not taken nitro yet. 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 I strongly advised initial use of nitro for improvement of symptoms. I strongly advised ED evaluation esp if nitro does not improve his discomfort. Patient asked if opening available in office. I stated that though possible to get in, this would probably not be beneficial, -- the patient has been given explanation that based on his symptoms and history that the emergency room would be ideal for evaluation of problems he is having. He voiced understanding and acknowledged my recommendations. He declined to go to the ED at this time but agreed to go if symptoms worse and/or no improvement w/ nitro.

## 2015-12-16 ENCOUNTER — Telehealth: Payer: Self-pay | Admitting: Physician Assistant

## 2015-12-16 ENCOUNTER — Other Ambulatory Visit: Payer: Self-pay | Admitting: Internal Medicine

## 2015-12-16 MED ORDER — ATORVASTATIN CALCIUM 40 MG PO TABS: 40.0000 mg | ORAL_TABLET | Freq: Every day | ORAL | Status: DC

## 2015-12-16 NOTE — Telephone Encounter (Signed)
Rx request sent to pharmacy.  

## 2015-12-16 NOTE — Telephone Encounter (Signed)
Patient's spouse called answering service - worried about husband. He was seen at urgent care today with foul smelling burps, abd pain, diarrhea and was told he had an ileus. He was told he needed to f/u GI as outpatient. His wife is concerned because sx persist and she wonders if it is appropriate to take him to the ER. Despite this being a non-cardiac issue, I advised if he continues to be uncomfortable and she is worried, there is no harm in proceeding to the ER for further evaluation. She plans to take him.  Dayna Dunn PA-C

## 2015-12-18 ENCOUNTER — Other Ambulatory Visit: Payer: Self-pay

## 2015-12-30 ENCOUNTER — Telehealth: Payer: Self-pay | Admitting: Internal Medicine

## 2015-12-30 ENCOUNTER — Other Ambulatory Visit: Payer: Self-pay | Admitting: Internal Medicine

## 2015-12-30 NOTE — Telephone Encounter (Signed)
1. Type of surgery: colonoscopy/EGD 2. Date of surgery: August 3rd, 2017 3. Surgeon: Dr. Marcial Pacasimothy Misenheimer 4. Medications that need to be held & how long: Aspirin & Plavix - need to be held starting January 11, 2016 5. Fax and/or Phone: (f) [phone]  (p) [phone]

## 2015-12-30 NOTE — Telephone Encounter (Signed)
metoprolol (LOPRESSOR) 50 MG tablet  Medication   Date: 06/24/2015  [facility]  Ordering/Authorizing: Chrystie NoseKenneth C Hilty, MD      Order Providers    Prescribing Provider Encounter Provider   Chrystie NoseKenneth C Hilty, MD Recardo Evangelistrystal Williamson, CMA    Medication Detail      Disp Refills Start End     metoprolol (LOPRESSOR) 50 MG tablet 60 tablet 9 06/24/2015     Sig - Route: Take 1 tablet (50 mg total) by mouth 2 (two) times daily. - Oral    E-Prescribing Status: Receipt confirmed by pharmacy (06/24/2015 12:16 PM EST)     Pharmacy    [facility] - [place], [place] - [address]

## 2015-12-30 NOTE — Telephone Encounter (Signed)
Ok to hold plavix 5 days and aspirin 7 days prior to colonoscopy. Low risk.  Dr. HRexene Edison

## 2015-12-30 NOTE — Telephone Encounter (Signed)
Clearance routed via EPIC 

## 2016-01-04 ENCOUNTER — Other Ambulatory Visit: Payer: Self-pay | Admitting: Internal Medicine

## 2016-01-08 ENCOUNTER — Other Ambulatory Visit: Payer: Self-pay | Admitting: Internal Medicine

## 2016-01-15 ENCOUNTER — Other Ambulatory Visit: Payer: Self-pay | Admitting: Internal Medicine

## 2016-01-15 NOTE — Telephone Encounter (Signed)
Rx(s) sent to pharmacy electronically.  

## 2016-01-16 DIAGNOSIS — Z955 Presence of coronary angioplasty implant and graft: Secondary | ICD-10-CM | POA: Diagnosis not present

## 2016-01-16 DIAGNOSIS — E119 Type 2 diabetes mellitus without complications: Secondary | ICD-10-CM | POA: Diagnosis not present

## 2016-01-16 DIAGNOSIS — K219 Gastro-esophageal reflux disease without esophagitis: Secondary | ICD-10-CM | POA: Diagnosis not present

## 2016-01-16 DIAGNOSIS — Z1211 Encounter for screening for malignant neoplasm of colon: Secondary | ICD-10-CM | POA: Diagnosis not present

## 2016-01-16 DIAGNOSIS — K621 Rectal polyp: Secondary | ICD-10-CM | POA: Diagnosis not present

## 2016-01-16 DIAGNOSIS — D125 Benign neoplasm of sigmoid colon: Secondary | ICD-10-CM | POA: Diagnosis not present

## 2016-01-16 DIAGNOSIS — I10 Essential (primary) hypertension: Secondary | ICD-10-CM | POA: Diagnosis not present

## 2016-01-16 DIAGNOSIS — K5730 Diverticulosis of large intestine without perforation or abscess without bleeding: Secondary | ICD-10-CM | POA: Diagnosis not present

## 2016-01-16 DIAGNOSIS — D124 Benign neoplasm of descending colon: Secondary | ICD-10-CM | POA: Diagnosis not present

## 2016-01-30 ENCOUNTER — Other Ambulatory Visit: Payer: Self-pay | Admitting: *Deleted

## 2016-01-30 MED ORDER — METOPROLOL TARTRATE 50 MG PO TABS: 50.0000 mg | ORAL_TABLET | Freq: Two times a day (BID) | ORAL | 0 refills | Status: DC

## 2016-01-30 NOTE — Telephone Encounter (Signed)
REFILL 

## 2016-01-31 ENCOUNTER — Other Ambulatory Visit: Payer: Self-pay | Admitting: Internal Medicine

## 2016-02-11 ENCOUNTER — Other Ambulatory Visit: Payer: Self-pay | Admitting: Internal Medicine

## 2016-02-11 NOTE — Telephone Encounter (Signed)
Rx request sent to pharmacy.  

## 2016-02-21 DIAGNOSIS — D3132 Benign neoplasm of left choroid: Secondary | ICD-10-CM | POA: Diagnosis not present

## 2016-03-04 ENCOUNTER — Other Ambulatory Visit: Payer: Self-pay | Admitting: Internal Medicine

## 2016-03-04 MED ORDER — METOPROLOL TARTRATE 50 MG PO TABS: 50.0000 mg | ORAL_TABLET | Freq: Two times a day (BID) | ORAL | 6 refills | Status: DC

## 2016-03-06 ENCOUNTER — Encounter: Payer: Self-pay | Admitting: Internal Medicine

## 2016-03-06 ENCOUNTER — Ambulatory Visit (INDEPENDENT_AMBULATORY_CARE_PROVIDER_SITE_OTHER): Payer: BLUE CROSS/BLUE SHIELD | Admitting: Internal Medicine

## 2016-03-06 ENCOUNTER — Other Ambulatory Visit: Payer: Self-pay

## 2016-03-06 VITALS — BP 106/70 | HR 76 | Ht 70.0 in | Wt 200.6 lb

## 2016-03-06 DIAGNOSIS — I480 Paroxysmal atrial fibrillation: Secondary | ICD-10-CM | POA: Diagnosis not present

## 2016-03-06 DIAGNOSIS — E785 Hyperlipidemia, unspecified: Secondary | ICD-10-CM

## 2016-03-06 DIAGNOSIS — I10 Essential (primary) hypertension: Secondary | ICD-10-CM | POA: Diagnosis not present

## 2016-03-06 DIAGNOSIS — I25119 Atherosclerotic heart disease of native coronary artery with unspecified angina pectoris: Secondary | ICD-10-CM | POA: Diagnosis not present

## 2016-03-06 MED ORDER — PANTOPRAZOLE SODIUM 40 MG PO TBEC: 40.0000 mg | DELAYED_RELEASE_TABLET | Freq: Every day | ORAL | 3 refills | Status: DC

## 2016-03-06 MED ORDER — METOPROLOL TARTRATE 50 MG PO TABS: 50.0000 mg | ORAL_TABLET | Freq: Two times a day (BID) | ORAL | 3 refills | Status: DC

## 2016-03-06 MED ORDER — CLOPIDOGREL BISULFATE 75 MG PO TABS: 75.0000 mg | ORAL_TABLET | Freq: Every day | ORAL | 3 refills | Status: DC

## 2016-03-06 MED ORDER — LISINOPRIL 10 MG PO TABS: 10.0000 mg | ORAL_TABLET | Freq: Every day | ORAL | 3 refills | Status: DC

## 2016-03-06 MED ORDER — ATORVASTATIN CALCIUM 40 MG PO TABS: 40.0000 mg | ORAL_TABLET | Freq: Every day | ORAL | 3 refills | Status: DC

## 2016-03-06 MED ORDER — AMLODIPINE BESYLATE 5 MG PO TABS: 5.0000 mg | ORAL_TABLET | Freq: Every day | ORAL | 3 refills | Status: DC

## 2016-03-06 NOTE — Progress Notes (Signed)
OFFICE NOTE  Chief Complaint:  Routine follow-up  Primary Care Physician: Eloisa NorthernAMIN, SAAD, MD  HPI:  Matthew KitchensStephen M Horton is a 50 y.o. male with PMH of HTN, DM-2, HLD, OSA & long-standing tobacco abuse with known moderate coronary disease by catheterization in March of 2014 for an abnormal stress test. At that time he shown to have moderate two-vessel disease in the diagonal branch of the LAD as well as the RCA. There was also an ostial OM1 lesion. An FFR of the RCA 70% lesion wasn't not significant. Medical therapy was recommended, The patient continued to have intermittent episodes of chest discomfort since that time. Despite being on aggressive therapy, he presented again on the morning of March 21, 2013 with substernal chest pain like a brick sitting on his chest. This was relieved by nitroglycerin. His EKG demonstrated no acute changes. He was admitted and ruled out for MI with negative troponins x 3. However, considering his history and high likelihood for progression of disease, a relook cath was recommended. This was performed by Dr. Herbie BaltimoreHarding, via the right radial artery. He was found to have severe 2 site CAD in the mid RCA and mid PDA, demonstrating significant progression of disease since March of 2014. Both lesions were treated with DES. He had normal LVEF and EDP. He tolerated the procedure well but was noted to have post PCI atrial fibrillation with RVR. He left the cath lab in stable condition. He had no further A-fib on telemetry. He remained CP free. The right radial access site remained stable. His labs were stable. He did have mild hypertension and was started on a low dose ACE-I. He was resumed on his BB and statin. He was placed on DAPT with ASA and Brilinta.  He returns today in follow-up and is feeling well. His radial cath site has a good pulse and there is good distal sensation and perfusion. He has had no further chest pain and no significant DOE.  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  He reports that he has stopped smoking with this recent hospitalization.  Matthew Horton was recently hospitalized again for chest pain, he underwent repeat cardiac catheterization which showed the folllowing:  Normal LV function   Two-vessel cardiac disease with previously noted 7080% stenosis in the diagonal branch of the LAD the very small superior branch of this diagonal vessel with 95% ostial stenosis and evidence for 30% proximal LAD narrowing as well as 80% mid LAD intramyocardial segment with mid systolic bridging which did improve following IC nitroglycerin administration; 30% narrowing in the distal circumflex marginal vessel; and widely patent mid and distal RCA stents with distal inferior LV branch diffuse stenoses/spasm and a small distal vessel   Previously, the patient has been demonstrated to have diagonal stenoses with normal FFR. The present diagonal stenosis appears 80% but visually is the same as previously. He also has evidence for mid systolic bridging of the mid LAD and spasm/diffuse stenoses of an inferior distal branch of his RCA. The previously placed RCA stents are widely patent. The patient will be treated with more aggressive medical regimen including nitrates as well as amlodipine. Smoking cessation is imperative. Consideration for nuclear imaging to assess for anterolateral ischemia may be helpful if recurrent symptomatology develops.  He followed up in our office postcardiac catheterization and reported some persistent sharp electric quick chest pains in the upper midepigastric area. He was started on  low-dose amlodipine for possible coronary spasm and he reported initially his symptoms improved significantly however recently they picked up again. His EKG shows no acute changes. He has not taken nitroglycerin because it "makes him feel bad".   I saw Matthew Horton back in the office today. He reports in March of  this year he went to see another cardiologist, Dr. Hedy Jacob at [facility], for a second opinion regarding persistent chest discomfort. Dr. Hedy Jacob felt that this was atypical for coronary artery disease and ordered a treadmill stress test. This was abnormal which led to a stress echocardiogram. This also was interpreted as mildly abnormal and ultimately he had a repeat cardiac catheterization. Per Matthew Horton this was negative for any new obstructive coronary disease. He was placed then on omeprazole and his symptoms improved almost immediately. I've surged on care everywhere for the cath Horton however cannot seem to find it. We will request those records from [facility]  03/06/2016  Matthew Horton returns today for follow-up. In January he saw [name], PA-C, who had evaluated him for persistent chest discomfort. The symptoms are somewhat atypical and more consistent with reflux. However he was restarted on Protonix and given isosorbide. He said he took the medication but felt "funny in the head", and then it was discontinued. He reports being compliant with pen Toprol resolved. He still gets episodes chest discomfort. He reports his blood sugars are fairly well controlled with an A1c of 6.1 that is unchanged despite more than 20 pound weight loss. He is currently at 200 pounds with a BMI of 28. Blood pressures on the low normal side at 106/70. EKG today shows sinus rhythm with no ischemia.  PMHx:  Past Medical History:  Diagnosis Date  . Atrial fibrillation (HCC)    a. Transient during adm 03/2013 for PCI.   Marland Kitchen CKD (chronic kidney disease) stage 2, GFR 60-89 ml/min   . Cluster headache   . Coronary artery disease    a. s/p DES x 2 to RCA 03/2013 - residual disease for med rx.  . DM2 (diabetes mellitus, type 2) (HCC)   . HLD (hyperlipidemia)   . HTN (hypertension)   . Low testosterone   . Nonallopathic lesion of cervical region   . OSA (obstructive sleep apnea)   . Tobacco  abuse   . Torticollis, acute     Past Surgical History:  Procedure Laterality Date  . CARDIAC CATHETERIZATION  08/08/2013  . CORONARY ANGIOPLASTY WITH STENT PLACEMENT  03/22/2013   "2 stents", Dr. Ranae Palms  . FRACTIONAL FLOW RESERVE WIRE Right 03/22/2013   Procedure: FRACTIONAL FLOW RESERVE WIRE;  Surgeon: Marykay Lex, MD;  Location: [facility] CATH LAB;  Service: Cardiovascular;  Laterality: Right;  . LEFT HEART CATHETERIZATION WITH CORONARY ANGIOGRAM N/A 08/16/2012   Procedure: LEFT HEART CATHETERIZATION WITH CORONARY ANGIOGRAM;  Surgeon: Runell Gess, MD;  Location: [facility] CATH LAB;  Service: Cardiovascular;  Laterality: N/A;  . LEFT HEART CATHETERIZATION WITH CORONARY ANGIOGRAM N/A 03/22/2013   Procedure: LEFT HEART CATHETERIZATION WITH CORONARY ANGIOGRAM;  Surgeon: Marykay Lex, MD;  Location: [facility] CATH LAB;  Service: Cardiovascular;  Laterality: N/A;  . LEFT HEART CATHETERIZATION WITH CORONARY ANGIOGRAM N/A 08/08/2013   Procedure: LEFT HEART CATHETERIZATION WITH CORONARY ANGIOGRAM;  Surgeon: Lennette Bihari, MD;  Location: [facility] CATH LAB;  Service: Cardiovascular;  Laterality: N/A;  . PERCUTANEOUS CORONARY STENT INTERVENTION (PCI-S) Right 03/22/2013   Procedure: PERCUTANEOUS CORONARY STENT INTERVENTION (PCI-S);  Surgeon: Marykay Lex, MD;  Location: [facility] CATH LAB;  Service: Cardiovascular;  Laterality: Right;  . TONSILLECTOMY    . TRAUMATIC AMPUTATION AND REATTACHMENT Left    Index finger    FAMHx:  Family History  Problem Relation Age of Onset  . Coronary artery disease Father 52    MI and s/p CABG  . Diabetes Father   . Hypertension Father   . Hyperlipidemia Father   . Hyperlipidemia Mother     SOCHx:   reports that he quit smoking about 2 years ago. His smoking use included Cigarettes. He has a 30.00 pack-year smoking history. He does not have any smokeless tobacco history on file. He reports that he does not drink alcohol or use drugs.  ALLERGIES:  Allergies  Allergen Reactions    . Ranexa [Ranolazine] Nausea Only and Other (See Comments)    dizziness    ROS: Pertinent items noted in HPI and remainder of comprehensive ROS otherwise negative.  HOME MEDS: Current Outpatient Prescriptions  Medication Sig Dispense Refill  . acetaminophen (TYLENOL) 325 MG tablet Take 1-2 tablets (325-650 mg total) by mouth every 4 (four) hours as needed for mild pain or moderate pain.    Marland Kitchen amLODipine (NORVASC) 5 MG tablet Take 1 tablet (5 mg total) by mouth daily. 90 tablet 3  . aspirin EC 81 MG tablet Take 81 mg by mouth every morning.     . clopidogrel (PLAVIX) 75 MG tablet Take 1 tablet (75 mg total) by mouth daily. 90 tablet 3  . lisinopril (PRINIVIL,ZESTRIL) 10 MG tablet Take 1 tablet (10 mg total) by mouth daily. 90 tablet 3  . metoprolol (LOPRESSOR) 50 MG tablet Take 1 tablet (50 mg total) by mouth 2 (two) times daily. 180 tablet 3  . nitroGLYCERIN (NITROSTAT) 0.4 MG SL tablet Place 1 tablet (0.4 mg total) under the tongue every 5 (five) minutes as needed for chest pain. 25 tablet 3  . pantoprazole (PROTONIX) 40 MG tablet Take 1 tablet (40 mg total) by mouth daily. 90 tablet 3  . SitaGLIPtin-MetFORMIN HCl (JANUMET XR) 50-1000 MG TB24 Take 1 tablet by mouth daily. 30 tablet 10  . atorvastatin (LIPITOR) 40 MG tablet Take 1 tablet (40 mg total) by mouth daily at 6 PM. 90 tablet 3   No current facility-administered medications for this visit.     LABS/IMAGING: No results found for this or any previous visit (from the past 48 hour(s)). No results found.  VITALS: BP 106/70 (BP Location: Right Arm, Patient Position: Sitting, Cuff Size: Normal)   Pulse 76   Ht 5\' 10"  (1.778 m)   Wt 200 lb 9.6 oz (91 kg)   SpO2 96%   BMI 28.78 kg/m   EXAM: GEN: Awake, NAD HEENT: PERRLA, EOMI Prevascular: RRR, NL S1/S2 Lungs: Clear bilaterally Abdomen: Soft, nontender, positive bowel sounds Extremities: No edema Neurologic: Grossly nonfocal Psych: Mildly anxious  EKG: Normal sinus  rhythm at 76  ASSESSMENT: 1. CAD s/p PCI to the mid-RCA and distal PDA with DES (03/2013) -  patent stents by cardiac catheterization at [facility] in 08/2014 2. HTN - controlled 3. Hyperlipidemia - on zocor 4. Tobacco dependence - has quit with recent hospitalization 5. Abnormal NST 6. Atypical chest pain 7. GERD   PLAN: 1.   Matthew Horton continues to have intermittent chest discomfort which is not improved with nitrates or isosorbide. Some of his symptoms related to GERD and it seems to be well-controlled when he takes his proton next. He has not restarted smoking. Stress is also  playing a role and he recently lost his job was working for another company. He says he works 16 hour days and does not rest appropriately. I've encouraged him to try to work on stress management, rest and exercise. No medicine changes at this time. Follow-up annually or sooner as necessary.  Chrystie Nose, MD, [facility] Attending Cardiologist [facility]  Chrystie Nose 03/06/2016, 5:47 PM

## 2016-03-06 NOTE — Patient Instructions (Signed)
Medication Instructions:  Your physician recommends that you continue on your current medications as directed. Please refer to the Current Medication list given to you today.  Labwork: NONE   Testing/Procedures: NONE   Follow-Up: Your physician wants you to follow-up in: 12 MONTHS WITH DR HILTY. You will receive a reminder letter in the mail two months in advance. If you don't receive a letter, please call our office to schedule the follow-up appointment.  Any Other Special Instructions Will Be Listed Below (If Applicable).     If you need a refill on your cardiac medications before your next appointment, please call your pharmacy.  

## 2016-03-30 ENCOUNTER — Other Ambulatory Visit: Payer: Self-pay | Admitting: Internal Medicine

## 2016-04-03 ENCOUNTER — Other Ambulatory Visit: Payer: Self-pay | Admitting: Internal Medicine

## 2016-04-03 DIAGNOSIS — I2510 Atherosclerotic heart disease of native coronary artery without angina pectoris: Secondary | ICD-10-CM | POA: Diagnosis not present

## 2016-04-03 DIAGNOSIS — J45909 Unspecified asthma, uncomplicated: Secondary | ICD-10-CM | POA: Diagnosis not present

## 2016-04-03 DIAGNOSIS — Z6828 Body mass index (BMI) 28.0-28.9, adult: Secondary | ICD-10-CM | POA: Diagnosis not present

## 2017-02-06 ENCOUNTER — Other Ambulatory Visit: Payer: Self-pay | Admitting: Internal Medicine

## 2017-02-08 NOTE — Telephone Encounter (Signed)
REFILL 

## 2017-03-08 ENCOUNTER — Other Ambulatory Visit: Payer: Self-pay | Admitting: Internal Medicine

## 2017-03-11 ENCOUNTER — Encounter: Payer: Self-pay | Admitting: Internal Medicine

## 2017-03-11 ENCOUNTER — Ambulatory Visit (INDEPENDENT_AMBULATORY_CARE_PROVIDER_SITE_OTHER): Payer: 59 | Admitting: Internal Medicine

## 2017-03-11 VITALS — BP 138/70 | HR 91 | Ht 70.0 in | Wt 219.0 lb

## 2017-03-11 DIAGNOSIS — M542 Cervicalgia: Secondary | ICD-10-CM

## 2017-03-11 DIAGNOSIS — E785 Hyperlipidemia, unspecified: Secondary | ICD-10-CM

## 2017-03-11 DIAGNOSIS — I2510 Atherosclerotic heart disease of native coronary artery without angina pectoris: Secondary | ICD-10-CM

## 2017-03-11 DIAGNOSIS — Z0289 Encounter for other administrative examinations: Secondary | ICD-10-CM

## 2017-03-11 DIAGNOSIS — Z823 Family history of stroke: Secondary | ICD-10-CM

## 2017-03-11 DIAGNOSIS — I10 Essential (primary) hypertension: Secondary | ICD-10-CM | POA: Diagnosis not present

## 2017-03-11 NOTE — Patient Instructions (Addendum)
Your physician has requested that you have an exercise stress myoview. For further information please visit [email or web address] Please follow instruction sheet, as given.  Your physician has requested that you have a carotid duplex. This test is an ultrasound of the carotid arteries in your neck. It looks at blood flow through these arteries that supply the brain with blood. Allow one hour for this exam. There are no restrictions or special instructions.  Your physician recommends that you return for lab work FASTING to check cholesterol  Your physician wants you to follow-up in: ONE YEAR with Dr. Rennis Golden. You will receive a reminder letter in the mail two months in advance. If you don't receive a letter, please call our office to schedule the follow-up appointment.

## 2017-03-13 DIAGNOSIS — Z823 Family history of stroke: Secondary | ICD-10-CM | POA: Insufficient documentation

## 2017-03-13 DIAGNOSIS — M542 Cervicalgia: Secondary | ICD-10-CM | POA: Insufficient documentation

## 2017-03-13 DIAGNOSIS — Z0289 Encounter for other administrative examinations: Secondary | ICD-10-CM | POA: Insufficient documentation

## 2017-03-13 NOTE — Progress Notes (Signed)
OFFICE NOTE  Chief Complaint:  Right neck pain  Primary Care Physician: [name], MD  HPI:  Matthew Horton is a 51 y.o. male with PMH of HTN, DM-2, HLD, OSA & long-standing tobacco abuse with known moderate coronary disease by catheterization in March of 2014 for an abnormal stress test. At that time he shown to have moderate two-vessel disease in the diagonal branch of the LAD as well as the RCA. There was also an ostial OM1 lesion. An FFR of the RCA 70% lesion wasn't not significant. Medical therapy was recommended, The patient continued to have intermittent episodes of chest discomfort since that time. Despite being on aggressive therapy, he presented again on the morning of March 21, 2013 with substernal chest pain like a brick sitting on his chest. This was relieved by nitroglycerin. His EKG demonstrated no acute changes. He was admitted and ruled out for MI with negative troponins x 3. However, considering his history and high likelihood for progression of disease, a relook cath was recommended. This was performed by Dr. Herbie Baltimore, via the right radial artery. He was found to have severe 2 site CAD in the mid RCA and mid PDA, demonstrating significant progression of disease since March of 2014. Both lesions were treated with DES. He had normal LVEF and EDP. He tolerated the procedure well but was noted to have post PCI atrial fibrillation with RVR. He left the cath lab in stable condition. He had no further A-fib on telemetry. He remained CP free. The right radial access site remained stable. His labs were stable. He did have mild hypertension and was started on a low dose ACE-I. He was resumed on his BB and statin. He was placed on DAPT with ASA and Brilinta.  He returns today in follow-up and is feeling well. His radial cath site has a good pulse and there is good distal sensation and perfusion. He has had no further chest pain and no significant DOE.  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  He reports that he has stopped smoking with this recent hospitalization.  Matthew Horton was recently hospitalized again for chest pain, he underwent repeat cardiac catheterization which showed the folllowing:  Normal LV function   Two-vessel cardiac disease with previously noted 7080% stenosis in the diagonal branch of the LAD the very small superior branch of this diagonal vessel with 95% ostial stenosis and evidence for 30% proximal LAD narrowing as well as 80% mid LAD intramyocardial segment with mid systolic bridging which did improve following IC nitroglycerin administration; 30% narrowing in the distal circumflex marginal vessel; and widely patent mid and distal RCA stents with distal inferior LV branch diffuse stenoses/spasm and a small distal vessel   Previously, the patient has been demonstrated to have diagonal stenoses with normal FFR. The present diagonal stenosis appears 80% but visually is the same as previously. He also has evidence for mid systolic bridging of the mid LAD and spasm/diffuse stenoses of an inferior distal branch of his RCA. The previously placed RCA stents are widely patent. The patient will be treated with more aggressive medical regimen including nitrates as well as amlodipine. Smoking cessation is imperative. Consideration for nuclear imaging to assess for anterolateral ischemia may be helpful if recurrent symptomatology develops.  He followed up in our office postcardiac catheterization and reported some persistent sharp electric quick chest pains in the upper midepigastric area. He was started  on low-dose amlodipine for possible coronary spasm and he reported initially his symptoms improved significantly however recently they picked up again. His EKG shows no acute changes. He has not taken nitroglycerin because it "makes him feel bad".   I saw Matthew Horton back in the office today. He reports in March of this  year he went to see another cardiologist, Dr. Hedy Jacob at [facility], for a second opinion regarding persistent chest discomfort. Dr. Hedy Jacob felt that this was atypical for coronary artery disease and ordered a treadmill stress test. This was abnormal which led to a stress echocardiogram. This also was interpreted as mildly abnormal and ultimately he had a repeat cardiac catheterization. Per Matthew Horton's report this was negative for any new obstructive coronary disease. He was placed then on omeprazole and his symptoms improved almost immediately. I've surged on care everywhere for the cath report however cannot seem to find it. We will request those records from [facility].  03/06/2016  Matthew Horton returns today for follow-up. In January he saw Matthew Newcomer, Matthew Horton, who had evaluated him for persistent chest discomfort. The symptoms are somewhat atypical and more consistent with reflux. However he was restarted on Protonix and given isosorbide. He said he took the medication but felt "funny in the head", and then it was discontinued. He reports being compliant with pen Toprol resolved. He still gets episodes chest discomfort. He reports his blood sugars are fairly well controlled with an A1c of 6.1 that is unchanged despite more than 20 pound weight loss. He is currently at 200 pounds with a BMI of 28. Blood pressures on the low normal side at 106/70. EKG today shows sinus rhythm with no ischemia.  03/11/2017  Matthew Horton was seen today in follow-up. He had had no issues over the past year. He denies chest pain, dyspnea or any palpitations. He says he has some pain in his right neck - it feels like a pulsation. Not worse when turning his neck - it is sharp and comes and goes. BP appears controlled today. He has not had a recent lipid profile in our system, but had an LDL-C of 105 in 2015. His weight is up some again and we discussed the importance of weight loss. 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  He says he will need a stress test to accomplish this.  PMHx:  Past Medical History:  Diagnosis Date  . Atrial fibrillation (HCC)    a. Transient during adm 03/2013 for PCI.   Marland Kitchen CKD (chronic kidney disease) stage 2, GFR 60-89 ml/min   . Cluster headache   . Coronary artery disease    a. s/p DES x 2 to RCA 03/2013 - residual disease for med rx.  . DM2 (diabetes mellitus, type 2) (HCC)   . HLD (hyperlipidemia)   . HTN (hypertension)   . Low testosterone   . Nonallopathic lesion of cervical region   . OSA (obstructive sleep apnea)   . Tobacco abuse   . Torticollis, acute     Past Surgical History:  Procedure Laterality Date  . CARDIAC CATHETERIZATION  08/08/2013  . CORONARY ANGIOPLASTY WITH STENT PLACEMENT  03/22/2013   "2 stents", Dr. Ranae Palms  . FRACTIONAL FLOW RESERVE WIRE Right 03/22/2013   Procedure: FRACTIONAL FLOW RESERVE WIRE;  Surgeon: Marykay Lex, MD;  Location: [facility] CATH LAB;  Service: Cardiovascular;  Laterality: Right;  . LEFT HEART CATHETERIZATION WITH CORONARY ANGIOGRAM N/A 08/16/2012   Procedure: LEFT HEART CATHETERIZATION WITH CORONARY  ANGIOGRAM;  Surgeon: Runell Gess, MD;  Location: [facility] CATH LAB;  Service: Cardiovascular;  Laterality: N/A;  . LEFT HEART CATHETERIZATION WITH CORONARY ANGIOGRAM N/A 03/22/2013   Procedure: LEFT HEART CATHETERIZATION WITH CORONARY ANGIOGRAM;  Surgeon: Marykay Lex, MD;  Location: [facility] CATH LAB;  Service: Cardiovascular;  Laterality: N/A;  . LEFT HEART CATHETERIZATION WITH CORONARY ANGIOGRAM N/A 08/08/2013   Procedure: LEFT HEART CATHETERIZATION WITH CORONARY ANGIOGRAM;  Surgeon: Lennette Bihari, MD;  Location: [facility] CATH LAB;  Service: Cardiovascular;  Laterality: N/A;  . PERCUTANEOUS CORONARY STENT INTERVENTION (PCI-S) Right 03/22/2013   Procedure: PERCUTANEOUS CORONARY STENT INTERVENTION (PCI-S);  Surgeon: Marykay Lex, MD;  Location: [facility] CATH LAB;  Service: Cardiovascular;  Laterality: Right;  . TONSILLECTOMY    . TRAUMATIC  AMPUTATION AND REATTACHMENT Left    Index finger    FAMHx:  Family History  Problem Relation Age of Onset  . Coronary artery disease Father 56       MI and s/p CABG  . Diabetes Father   . Hypertension Father   . Hyperlipidemia Father   . Hyperlipidemia Mother     SOCHx:   reports that he quit smoking about 3 years ago. His smoking use included Cigarettes. He has a 30.00 pack-year smoking history. He has never used smokeless tobacco. He reports that he does not drink alcohol or use drugs.  ALLERGIES:  Allergies  Allergen Reactions  . Ranexa [Ranolazine] Nausea Only and Other (See Comments)    dizziness    ROS: Pertinent items noted in HPI and remainder of comprehensive ROS otherwise negative.  HOME MEDS: Current Outpatient Prescriptions  Medication Sig Dispense Refill  . acetaminophen (TYLENOL) 325 MG tablet Take 1-2 tablets (325-650 mg total) by mouth every 4 (four) hours as needed for mild pain or moderate pain.    Marland Kitchen amLODipine (NORVASC) 5 MG tablet TAKE 1 TABLET (5 MG TOTAL) BY MOUTH DAILY. 90 tablet 0  . aspirin EC 81 MG tablet Take 81 mg by mouth every morning.     Marland Kitchen atorvastatin (LIPITOR) 40 MG tablet TAKE 1 TABLET (40 MG TOTAL) BY MOUTH DAILY AT 6 PM. 90 tablet 0  . clopidogrel (PLAVIX) 75 MG tablet TAKE 1 TABLET (75 MG TOTAL) BY MOUTH DAILY. 90 tablet 0  . lisinopril (PRINIVIL,ZESTRIL) 10 MG tablet TAKE 1 TABLET (10 MG TOTAL) BY MOUTH DAILY. 90 tablet 0  . metoprolol tartrate (LOPRESSOR) 50 MG tablet TAKE 1 TABLET (50 MG TOTAL) BY MOUTH 2 (TWO) TIMES DAILY. 180 tablet 0  . nitroGLYCERIN (NITROSTAT) 0.4 MG SL tablet Place 1 tablet (0.4 mg total) under the tongue every 5 (five) minutes as needed for chest pain. 25 tablet 3  . pantoprazole (PROTONIX) 40 MG tablet TAKE 1 TABLET (40 MG TOTAL) BY MOUTH DAILY. 90 tablet 0  . PROAIR HFA 108 (90 Base) MCG/ACT inhaler As needed    . SitaGLIPtin-MetFORMIN HCl (JANUMET XR) 50-1000 MG TB24 Take 1 tablet by mouth daily. 30 tablet 10    No current facility-administered medications for this visit.     LABS/IMAGING: No results found for this or any previous visit (from the past 48 hour(s)). No results found.  VITALS: BP 138/70   Pulse 91   Ht  (1.778 m)   Wt 219 lb (99.3 kg)   BMI 31.42 kg/m   EXAM: General appearance: alert and no distress Neck: no carotid bruit, no JVD and thyroid not enlarged, symmetric, no tenderness/mass/nodules Lungs: clear to auscultation bilaterally Heart: regular rate  and rhythm, S1, S2 normal, no murmur, click, rub or gallop Abdomen: soft, non-tender; bowel sounds normal; no masses,  no organomegaly Extremities: extremities normal, atraumatic, no cyanosis or edema Pulses: 2+ and symmetric Skin: Skin color, texture, turgor normal. No rashes or lesions Neurologic: Grossly normal Psych: Pleasant  EKG: Normal sinus rhythm at 91 - personally reviewed  ASSESSMENT: 1. CAD s/p PCI to the mid-RCA and distal PDA with DES (03/2013) -  patent stents by cardiac catheterization at [facility] in 08/2014 2. HTN - controlled 3. Hyperlipidemia - on zocor 4. Tobacco dependence - has quit with recent hospitalization 5. Abnormal NST 6. Atypical chest pain 7. GERD  8. Visit for DOT exam  PLAN: 1.   Matthew Horton says that he has not experienced any chest discomfort since I last saw him. He is active, but has gained weight. He needs to continue to work on that. Will plan to recheck a lipid profile as it has been some time since we did that. He reports some right neck pain - I can't auscultate a bruit - no pain on palpation - it comes and goes. His father had multiple strokes. We'll check carotid dopplers. 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 Will arrange for that as well.  Follow-up with me annually or sooner as necessary.  Chrystie Nose, MD, [facility] Attending Cardiologist [facility]  Chrystie Nose 03/13/2017, 5:12 PM

## 2017-03-15 ENCOUNTER — Other Ambulatory Visit: Payer: Self-pay | Admitting: Cardiology

## 2017-03-15 DIAGNOSIS — I10 Essential (primary) hypertension: Secondary | ICD-10-CM

## 2017-04-07 ENCOUNTER — Telehealth (HOSPITAL_COMMUNITY): Payer: Self-pay

## 2017-04-07 NOTE — Telephone Encounter (Signed)
Encounter complete. 

## 2017-04-08 ENCOUNTER — Encounter (HOSPITAL_COMMUNITY): Payer: Self-pay

## 2017-04-08 ENCOUNTER — Ambulatory Visit (HOSPITAL_COMMUNITY)
Admission: RE | Admit: 2017-04-08 | Discharge: 2017-04-08 | Disposition: A | Discharge status: Home / Self Care | Payer: 59 | Source: Ambulatory Visit | Attending: Cardiovascular Disease | Admitting: Cardiovascular Disease

## 2017-04-08 DIAGNOSIS — Z87891 Personal history of nicotine dependence: Secondary | ICD-10-CM | POA: Insufficient documentation

## 2017-04-08 DIAGNOSIS — R42 Dizziness and giddiness: Secondary | ICD-10-CM | POA: Diagnosis not present

## 2017-04-08 DIAGNOSIS — I10 Essential (primary) hypertension: Secondary | ICD-10-CM | POA: Insufficient documentation

## 2017-04-08 DIAGNOSIS — M542 Cervicalgia: Secondary | ICD-10-CM | POA: Insufficient documentation

## 2017-04-08 DIAGNOSIS — E785 Hyperlipidemia, unspecified: Secondary | ICD-10-CM | POA: Diagnosis not present

## 2017-04-08 DIAGNOSIS — R51 Headache: Secondary | ICD-10-CM | POA: Insufficient documentation

## 2017-04-08 DIAGNOSIS — I6523 Occlusion and stenosis of bilateral carotid arteries: Secondary | ICD-10-CM | POA: Insufficient documentation

## 2017-04-08 DIAGNOSIS — E119 Type 2 diabetes mellitus without complications: Secondary | ICD-10-CM | POA: Insufficient documentation

## 2017-04-08 DIAGNOSIS — Z823 Family history of stroke: Secondary | ICD-10-CM | POA: Insufficient documentation

## 2017-04-15 ENCOUNTER — Encounter (HOSPITAL_COMMUNITY): Payer: Self-pay

## 2017-04-15 ENCOUNTER — Encounter (HOSPITAL_COMMUNITY): Payer: 59

## 2017-04-20 ENCOUNTER — Telehealth (HOSPITAL_COMMUNITY): Payer: Self-pay

## 2017-04-20 NOTE — Telephone Encounter (Signed)
Encounter complete. 

## 2017-04-22 ENCOUNTER — Ambulatory Visit (HOSPITAL_COMMUNITY)
Admission: RE | Admit: 2017-04-22 | Discharge: 2017-04-22 | Disposition: A | Discharge status: Home / Self Care | Payer: 59 | Source: Ambulatory Visit | Attending: Cardiology | Admitting: Cardiology

## 2017-04-22 DIAGNOSIS — I10 Essential (primary) hypertension: Secondary | ICD-10-CM | POA: Insufficient documentation

## 2017-04-22 DIAGNOSIS — R9439 Abnormal result of other cardiovascular function study: Secondary | ICD-10-CM | POA: Diagnosis not present

## 2017-04-22 LAB — MYOCARDIAL PERFUSION IMAGING
CHL CUP MPHR: 169 {beats}/min
CHL CUP NUCLEAR SDS: 2
CHL CUP RESTING HR STRESS: 88 {beats}/min
CHL RATE OF PERCEIVED EXERTION: 18
CSEPED: 8 min
CSEPEDS: 30 s
CSEPHR: 91 %
Estimated workload: 9.3 METS
LV sys vol: 42 mL
LVDIAVOL: 99 mL (ref 62–150)
Peak HR: 155 {beats}/min
SRS: 3
SSS: 5
TID: 0.94

## 2017-04-22 MED ORDER — TECHNETIUM TC 99M TETROFOSMIN IV KIT
26.6000 | PACK | Freq: Once | INTRAVENOUS | Status: AC | PRN
  Administered 2017-04-22: 26.6 via INTRAVENOUS
  Filled 2017-04-22: qty 27

## 2017-04-22 MED ORDER — TECHNETIUM TC 99M TETROFOSMIN IV KIT
8.6000 | PACK | Freq: Once | INTRAVENOUS | Status: AC | PRN
  Administered 2017-04-22: 8.6 via INTRAVENOUS
  Filled 2017-04-22: qty 9

## 2017-04-27 ENCOUNTER — Telehealth: Payer: Self-pay | Admitting: Internal Medicine

## 2017-04-27 NOTE — Telephone Encounter (Signed)
-----   Message from Chrystie NoseKenneth C Hilty, MD sent at 04/26/2017  4:30 PM EST ----- Low risk stress test - small area of inferoapical ischemia. Ok to proceed with CDL driving.  Dr. Rennis GoldenHilty

## 2017-04-27 NOTE — Telephone Encounter (Signed)
Patient called with low risk stress test results. Letter w/result info composed and routed to MyChart

## 2017-05-11 ENCOUNTER — Other Ambulatory Visit: Payer: Self-pay | Admitting: Internal Medicine

## 2017-05-11 NOTE — Telephone Encounter (Signed)
REFILL 

## 2017-06-01 ENCOUNTER — Other Ambulatory Visit: Payer: Self-pay | Admitting: *Deleted

## 2017-06-01 MED ORDER — CLOPIDOGREL BISULFATE 75 MG PO TABS: 75.0000 mg | ORAL_TABLET | Freq: Every day | ORAL | 2 refills | Status: DC

## 2017-06-01 MED ORDER — LISINOPRIL 10 MG PO TABS: 10.0000 mg | ORAL_TABLET | Freq: Every day | ORAL | 2 refills | Status: DC

## 2017-06-01 MED ORDER — METOPROLOL TARTRATE 50 MG PO TABS: 50.0000 mg | ORAL_TABLET | Freq: Two times a day (BID) | ORAL | 2 refills | Status: DC

## 2017-06-01 MED ORDER — AMLODIPINE BESYLATE 5 MG PO TABS: 5.0000 mg | ORAL_TABLET | Freq: Every day | ORAL | 2 refills | Status: DC

## 2017-06-01 MED ORDER — PANTOPRAZOLE SODIUM 40 MG PO TBEC: 40.0000 mg | DELAYED_RELEASE_TABLET | Freq: Every day | ORAL | 2 refills | Status: DC

## 2017-06-01 NOTE — Telephone Encounter (Signed)
Rx has been sent to the pharmacy electronically. ° °

## 2017-06-17 ENCOUNTER — Encounter: Payer: Self-pay | Admitting: *Deleted

## 2017-06-17 NOTE — Telephone Encounter (Signed)
This encounter was created in error - please disregard.

## 2017-07-03 DIAGNOSIS — N23 Unspecified renal colic: Secondary | ICD-10-CM | POA: Diagnosis not present

## 2017-07-03 DIAGNOSIS — Z87442 Personal history of urinary calculi: Secondary | ICD-10-CM | POA: Diagnosis not present

## 2017-07-03 DIAGNOSIS — N201 Calculus of ureter: Secondary | ICD-10-CM | POA: Diagnosis not present

## 2017-07-03 DIAGNOSIS — R109 Unspecified abdominal pain: Secondary | ICD-10-CM | POA: Diagnosis not present

## 2017-07-03 DIAGNOSIS — N132 Hydronephrosis with renal and ureteral calculous obstruction: Secondary | ICD-10-CM | POA: Diagnosis not present

## 2017-07-04 DIAGNOSIS — N132 Hydronephrosis with renal and ureteral calculous obstruction: Secondary | ICD-10-CM | POA: Diagnosis not present

## 2017-07-19 DIAGNOSIS — N3020 Other chronic cystitis without hematuria: Secondary | ICD-10-CM | POA: Diagnosis not present

## 2017-07-19 DIAGNOSIS — N200 Calculus of kidney: Secondary | ICD-10-CM | POA: Diagnosis not present

## 2017-07-19 DIAGNOSIS — N201 Calculus of ureter: Secondary | ICD-10-CM | POA: Diagnosis not present

## 2017-07-22 DIAGNOSIS — N201 Calculus of ureter: Secondary | ICD-10-CM | POA: Diagnosis not present

## 2017-07-22 DIAGNOSIS — N3020 Other chronic cystitis without hematuria: Secondary | ICD-10-CM | POA: Diagnosis not present

## 2017-09-06 ENCOUNTER — Other Ambulatory Visit: Payer: Self-pay

## 2017-09-06 MED ORDER — PANTOPRAZOLE SODIUM 40 MG PO TBEC: 40.0000 mg | DELAYED_RELEASE_TABLET | Freq: Every day | ORAL | 2 refills | Status: DC

## 2017-09-13 ENCOUNTER — Telehealth: Payer: Self-pay | Admitting: Internal Medicine

## 2017-09-13 NOTE — Telephone Encounter (Addendum)
Thurmon FairSTEPHEN Borrayo KeyLenard Forth: [id number] - PA Case : [id number]: [id number]   Status: sent to plan today  Drug: Pantoprazole Sodium 40MG  OR TBEC  Form: Advice workerGEHA Electronic PA Form  * received faxed notice from pharmacy that "alternative request: insurance requires PA for more than 90 tabs in 365 days, do PA or send in alternative" * called pharmacy for updated insurance info:  ID: [id number] Group: [id number] BIN: [id number]

## 2017-09-13 NOTE — Telephone Encounter (Signed)
Pantoprazole has been approved 09/13/2017 - 09/13/2020

## 2017-10-29 DIAGNOSIS — T148XXA Other injury of unspecified body region, initial encounter: Secondary | ICD-10-CM | POA: Diagnosis not present

## 2017-11-11 DIAGNOSIS — E119 Type 2 diabetes mellitus without complications: Secondary | ICD-10-CM | POA: Diagnosis not present

## 2017-11-11 DIAGNOSIS — Z1389 Encounter for screening for other disorder: Secondary | ICD-10-CM | POA: Diagnosis not present

## 2017-11-11 DIAGNOSIS — E785 Hyperlipidemia, unspecified: Secondary | ICD-10-CM | POA: Diagnosis not present

## 2017-11-11 DIAGNOSIS — Z0000 Encounter for general adult medical examination without abnormal findings: Secondary | ICD-10-CM | POA: Diagnosis not present

## 2017-11-11 DIAGNOSIS — I10 Essential (primary) hypertension: Secondary | ICD-10-CM | POA: Diagnosis not present

## 2017-11-11 DIAGNOSIS — I2510 Atherosclerotic heart disease of native coronary artery without angina pectoris: Secondary | ICD-10-CM | POA: Diagnosis not present

## 2018-01-28 ENCOUNTER — Other Ambulatory Visit: Payer: Self-pay | Admitting: Internal Medicine

## 2018-01-31 NOTE — Telephone Encounter (Signed)
Rx sent to pharmacy   

## 2018-02-18 DIAGNOSIS — E119 Type 2 diabetes mellitus without complications: Secondary | ICD-10-CM | POA: Diagnosis not present

## 2018-03-09 ENCOUNTER — Other Ambulatory Visit: Payer: Self-pay | Admitting: Internal Medicine

## 2018-03-10 ENCOUNTER — Encounter: Payer: Self-pay | Admitting: Internal Medicine

## 2018-03-10 ENCOUNTER — Ambulatory Visit (INDEPENDENT_AMBULATORY_CARE_PROVIDER_SITE_OTHER): Payer: BLUE CROSS/BLUE SHIELD | Admitting: Internal Medicine

## 2018-03-10 VITALS — BP 140/88 | HR 79 | Ht 70.0 in | Wt 212.8 lb

## 2018-03-10 DIAGNOSIS — I2510 Atherosclerotic heart disease of native coronary artery without angina pectoris: Secondary | ICD-10-CM | POA: Diagnosis not present

## 2018-03-10 DIAGNOSIS — E785 Hyperlipidemia, unspecified: Secondary | ICD-10-CM | POA: Diagnosis not present

## 2018-03-10 DIAGNOSIS — I10 Essential (primary) hypertension: Secondary | ICD-10-CM

## 2018-03-10 MED ORDER — PANTOPRAZOLE SODIUM 40 MG PO TBEC: 40.0000 mg | DELAYED_RELEASE_TABLET | Freq: Every day | ORAL | 3 refills | Status: DC

## 2018-03-10 NOTE — Patient Instructions (Signed)
Medication Instructions:   Continue current medications Pantoprazole (Protonix) has been refilled  Labwork:  FASTING lab work to check cholesterol  Testing/Procedures:  NONE  Follow-Up:  Your physician wants you to follow-up in: ONE YEAR with Dr. Rennis Golden. You will receive a reminder letter in the mail two months in advance. If you don't receive a letter, please call our office to schedule the follow-up appointment.   If you need a refill on your cardiac medications before your next appointment, please call your pharmacy.  Any Other Special Instructions Will Be Listed Below (If Applicable).

## 2018-03-10 NOTE — Progress Notes (Signed)
OFFICE NOTE  Chief Complaint:  Upper abdominal tightness in the mornings  Primary Care Physician: [name], MD  HPI:  Matthew Horton is a 52 y.o. male with PMH of HTN, DM-2, HLD, OSA & long-standing tobacco abuse with known moderate coronary disease by catheterization in March of 2014 for an abnormal stress test. At that time he shown to have moderate two-vessel disease in the diagonal branch of the LAD as well as the RCA. There was also an ostial OM1 lesion. An FFR of the RCA 70% lesion wasn't not significant. Medical therapy was recommended, The patient continued to have intermittent episodes of chest discomfort since that time. Despite being on aggressive therapy, he presented again on the morning of March 21, 2013 with substernal chest pain like a brick sitting on his chest. This was relieved by nitroglycerin. His EKG demonstrated no acute changes. He was admitted and ruled out for MI with negative troponins x 3. However, considering his history and high likelihood for progression of disease, a relook cath was recommended. This was performed by Dr. Herbie Baltimore, via the right radial artery. He was found to have severe 2 site CAD in the mid RCA and mid PDA, demonstrating significant progression of disease since March of 2014. Both lesions were treated with DES. He had normal LVEF and EDP. He tolerated the procedure well but was noted to have post PCI atrial fibrillation with RVR. He left the cath lab in stable condition. He had no further A-fib on telemetry. He remained CP free. The right radial access site remained stable. His labs were stable. He did have mild hypertension and was started on a low dose ACE-I. He was resumed on his BB and statin. He was placed on DAPT with ASA and Brilinta.  He returns today in follow-up and is feeling well. His radial cath site has a good pulse and there is good distal sensation and perfusion. He has had no further chest pain and no significant DOE.  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  He reports that he has stopped smoking with this recent hospitalization.  Matthew Horton was recently hospitalized again for chest pain, he underwent repeat cardiac catheterization which showed the folllowing:  Normal LV function   Two-vessel cardiac disease with previously noted 7080% stenosis in the diagonal branch of the LAD the very small superior branch of this diagonal vessel with 95% ostial stenosis and evidence for 30% proximal LAD narrowing as well as 80% mid LAD intramyocardial segment with mid systolic bridging which did improve following IC nitroglycerin administration; 30% narrowing in the distal circumflex marginal vessel; and widely patent mid and distal RCA stents with distal inferior LV branch diffuse stenoses/spasm and a small distal vessel   Previously, the patient has been demonstrated to have diagonal stenoses with normal FFR. The present diagonal stenosis appears 80% but visually is the same as previously. He also has evidence for mid systolic bridging of the mid LAD and spasm/diffuse stenoses of an inferior distal branch of his RCA. The previously placed RCA stents are widely patent. The patient will be treated with more aggressive medical regimen including nitrates as well as amlodipine. Smoking cessation is imperative. Consideration for nuclear imaging to assess for anterolateral ischemia may be helpful if recurrent symptomatology develops.  He followed up in our office postcardiac catheterization and reported some persistent sharp electric quick chest pains in the upper midepigastric area.  He was started on low-dose amlodipine for possible coronary spasm and he reported initially his symptoms improved significantly however recently they picked up again. His EKG shows no acute changes. He has not taken nitroglycerin because it "makes him feel bad".   I saw Matthew Horton back in the office today. He  reports in March of this year he went to see another cardiologist, Dr. Hedy Jacob at [facility], for a second opinion regarding persistent chest discomfort. Dr. Hedy Jacob felt that this was atypical for coronary artery disease and ordered a treadmill stress test. This was abnormal which led to a stress echocardiogram. This also was interpreted as mildly abnormal and ultimately he had a repeat cardiac catheterization. Per Matthew Horton's report this was negative for any new obstructive coronary disease. He was placed then on omeprazole and his symptoms improved almost immediately. I've surged on care everywhere for the cath report however cannot seem to find it. We will request those records from [facility].  03/06/2016  Matthew Horton returns today for follow-up. In January he saw Matthew Newcomer, PA-C, who had evaluated him for persistent chest discomfort. The symptoms are somewhat atypical and more consistent with reflux. However he was restarted on Protonix and given isosorbide. He said he took the medication but felt "funny in the head", and then it was discontinued. He reports being compliant with pen Toprol resolved. He still gets episodes chest discomfort. He reports his blood sugars are fairly well controlled with an A1c of 6.1 that is unchanged despite more than 20 pound weight loss. He is currently at 200 pounds with a BMI of 28. Blood pressures on the low normal side at 106/70. EKG today shows sinus rhythm with no ischemia.  03/11/2017  Matthew Horton was seen today in follow-up. He had had no issues over the past year. He denies chest pain, dyspnea or any palpitations. He says he has some pain in his right neck - it feels like a pulsation. Not worse when turning his neck - it is sharp and comes and goes. BP appears controlled today. He has not had a recent lipid profile in our system, but had an LDL-C of 105 in 2015. His weight is up some again and we discussed the importance of weight loss. 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 He says he will need a stress test to accomplish this.  03/10/2018  Matthew Horton was seen today in follow-up.  Is been a year since I last saw him.  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  He underwent stress testing last year which was negative for ischemia.  He says he will need repeat testing every other year.  He has had minimal weight loss.  His diet is less than optimal and higher in saturated fats.  Recent LDL cholesterol in May was 91 with goal LDL less than 70.  He also reports this continued globus sensation in the neck.  Carotid Dopplers were negative.  He has some upper abdominal tightness which he feels like it is in his chest, however he points to his upper abdomen.  He says this happens mostly in the mornings and may represent reflux.  He was previously on Protonix however due to the cost of the medicine he stopped taking it.  He said it was the only medicine that "work for him".  PMHx:  Past Medical History:  Diagnosis Date  . Atrial fibrillation (HCC)    a. Transient during adm 03/2013  for PCI.   Marland Kitchen CKD (chronic kidney disease) stage 2, GFR 60-89 ml/min   . Cluster headache   . Coronary artery disease    a. s/p DES x 2 to RCA 03/2013 - residual disease for med rx.  . DM2 (diabetes mellitus, type 2) (HCC)   . HLD (hyperlipidemia)   . HTN (hypertension)   . Low testosterone   . Nonallopathic lesion of cervical region   . OSA (obstructive sleep apnea)   . Tobacco abuse   . Torticollis, acute     Past Surgical History:  Procedure Laterality Date  . CARDIAC CATHETERIZATION  08/08/2013  . CORONARY ANGIOPLASTY WITH STENT PLACEMENT  03/22/2013   "2 stents", Dr. Ranae Palms  . FRACTIONAL FLOW RESERVE WIRE Right 03/22/2013   Procedure: FRACTIONAL FLOW RESERVE WIRE;  Surgeon: Marykay Lex, MD;  Location: [facility] CATH LAB;  Service: Cardiovascular;  Laterality: Right;  . LEFT HEART CATHETERIZATION WITH CORONARY ANGIOGRAM N/A 08/16/2012   Procedure:  LEFT HEART CATHETERIZATION WITH CORONARY ANGIOGRAM;  Surgeon: Runell Gess, MD;  Location: [facility] CATH LAB;  Service: Cardiovascular;  Laterality: N/A;  . LEFT HEART CATHETERIZATION WITH CORONARY ANGIOGRAM N/A 03/22/2013   Procedure: LEFT HEART CATHETERIZATION WITH CORONARY ANGIOGRAM;  Surgeon: Marykay Lex, MD;  Location: [facility] CATH LAB;  Service: Cardiovascular;  Laterality: N/A;  . LEFT HEART CATHETERIZATION WITH CORONARY ANGIOGRAM N/A 08/08/2013   Procedure: LEFT HEART CATHETERIZATION WITH CORONARY ANGIOGRAM;  Surgeon: Lennette Bihari, MD;  Location: [facility] CATH LAB;  Service: Cardiovascular;  Laterality: N/A;  . PERCUTANEOUS CORONARY STENT INTERVENTION (PCI-S) Right 03/22/2013   Procedure: PERCUTANEOUS CORONARY STENT INTERVENTION (PCI-S);  Surgeon: Marykay Lex, MD;  Location: [facility] CATH LAB;  Service: Cardiovascular;  Laterality: Right;  . TONSILLECTOMY    . TRAUMATIC AMPUTATION AND REATTACHMENT Left    Index finger    FAMHx:  Family History  Problem Relation Age of Onset  . Coronary artery disease Father 74       MI and s/p CABG  . Diabetes Father   . Hypertension Father   . Hyperlipidemia Father   . Hyperlipidemia Mother     SOCHx:   reports that he quit smoking about 4 years ago. His smoking use included cigarettes. He has a 30.00 pack-year smoking history. He has never used smokeless tobacco. He reports that he does not drink alcohol or use drugs.  ALLERGIES:  Allergies  Allergen Reactions  . Ranexa [Ranolazine] Nausea Only and Other (See Comments)    dizziness    ROS: Pertinent items noted in HPI and remainder of comprehensive ROS otherwise negative.  HOME MEDS: Current Outpatient Medications  Medication Sig Dispense Refill  . acetaminophen (TYLENOL) 325 MG tablet Take 1-2 tablets (325-650 mg total) by mouth every 4 (four) hours as needed for mild pain or moderate pain.    Marland Kitchen amLODipine (NORVASC) 5 MG tablet Take 1 tablet (5 mg total) by mouth daily. 90 tablet 2  . aspirin  EC 81 MG tablet Take 81 mg by mouth every morning.     Marland Kitchen atorvastatin (LIPITOR) 40 MG tablet TAKE 1 TABLET (40 MG TOTAL) BY MOUTH DAILY AT 6 PM. 90 tablet 3  . clopidogrel (PLAVIX) 75 MG tablet Take 1 tablet (75 mg total) by mouth daily. Keep follow up appointment. 90 tablet 0  . lisinopril (PRINIVIL,ZESTRIL) 10 MG tablet Take 1 tablet (10 mg total) by mouth daily. 90 tablet 2  . metoprolol tartrate (LOPRESSOR) 50 MG tablet Take 1 tablet (50 mg  total) by mouth 2 (two) times daily. 180 tablet 2  . nitroGLYCERIN (NITROSTAT) 0.4 MG SL tablet Place 1 tablet (0.4 mg total) under the tongue every 5 (five) minutes as needed for chest pain. 25 tablet 3  . pantoprazole (PROTONIX) 40 MG tablet Take 1 tablet (40 mg total) by mouth daily. 90 tablet 2  . PROAIR HFA 108 (90 Base) MCG/ACT inhaler As needed    . SitaGLIPtin-MetFORMIN HCl (JANUMET XR) 50-1000 MG TB24 Take 1 tablet by mouth daily. 30 tablet 10   No current facility-administered medications for this visit.     LABS/IMAGING: No results found for this or any previous visit (from the past 48 hour(s)). No results found.  VITALS: BP 140/88   Pulse 79   Ht 5\' 10"  (1.778 m)   Wt 212 lb 12.8 oz (96.5 kg)   BMI 30.53 kg/m   EXAM: General appearance: alert and no distress Neck: no carotid bruit, no JVD and thyroid not enlarged, symmetric, no tenderness/mass/nodules Lungs: clear to auscultation bilaterally Heart: regular rate and rhythm, S1, S2 normal, no murmur, click, rub or gallop Abdomen: soft, non-tender; bowel sounds normal; no masses,  no organomegaly Extremities: extremities normal, atraumatic, no cyanosis or edema Pulses: 2+ and symmetric Skin: Skin color, texture, turgor normal. No rashes or lesions Neurologic: Grossly normal Psych: Pleasant  EKG: Normal sinus rhythm at 79-personally reviewed  ASSESSMENT: 1. CAD s/p PCI to the mid-RCA and distal PDA with DES (03/2013) -  patent stents by cardiac catheterization at [facility] in  08/2014 2. HTN - controlled 3. Hyperlipidemia 4. Former smoker 5. GERD   PLAN: 1.   Matthew Horton deems to be asymptomatic from a cardiac standpoint.  His blood pressure is controlled his cholesterol is not quite at goal with LDL of 91.  He needs to work strictly on his diet.  I like to repeat a lipid profile.  We may need to consider adding ezetimibe.  We will go ahead and refill his Protonix which she should be able to get.  For some reason insurance was not paying for it.  Follow-up with me annually or sooner as necessary.  Chrystie Nose, MD, [facility], FACP  [place]  [facility]  Medical Director of the [facility] Lipid Disorders &  Cardiovascular Risk Reduction Clinic Diplomate of the American Board of Clinical Lipidology Attending Cardiologist  Direct Dial: [phone]  Fax: [phone]  Website:  [email or web address] 03/10/2018, 8:19 AM

## 2018-05-09 ENCOUNTER — Other Ambulatory Visit: Payer: Self-pay | Admitting: Internal Medicine

## 2018-05-18 ENCOUNTER — Other Ambulatory Visit: Payer: Self-pay | Admitting: Internal Medicine

## 2018-08-26 DIAGNOSIS — G8929 Other chronic pain: Secondary | ICD-10-CM | POA: Diagnosis not present

## 2018-08-26 DIAGNOSIS — E119 Type 2 diabetes mellitus without complications: Secondary | ICD-10-CM | POA: Diagnosis not present

## 2018-08-26 DIAGNOSIS — M79671 Pain in right foot: Secondary | ICD-10-CM | POA: Diagnosis not present

## 2018-10-01 ENCOUNTER — Other Ambulatory Visit: Payer: Self-pay | Admitting: Internal Medicine

## 2018-10-03 NOTE — Telephone Encounter (Signed)
Lisinopril 10 mg refilled  

## 2018-10-26 DIAGNOSIS — Z87442 Personal history of urinary calculi: Secondary | ICD-10-CM | POA: Diagnosis not present

## 2018-10-26 DIAGNOSIS — N200 Calculus of kidney: Secondary | ICD-10-CM | POA: Diagnosis not present

## 2018-10-26 DIAGNOSIS — N3020 Other chronic cystitis without hematuria: Secondary | ICD-10-CM | POA: Diagnosis not present

## 2018-10-26 DIAGNOSIS — R1031 Right lower quadrant pain: Secondary | ICD-10-CM | POA: Diagnosis not present

## 2018-10-26 DIAGNOSIS — N23 Unspecified renal colic: Secondary | ICD-10-CM | POA: Diagnosis not present

## 2018-10-26 DIAGNOSIS — N1330 Unspecified hydronephrosis: Secondary | ICD-10-CM | POA: Diagnosis not present

## 2018-10-26 DIAGNOSIS — N201 Calculus of ureter: Secondary | ICD-10-CM | POA: Diagnosis not present

## 2018-10-27 DIAGNOSIS — Z79899 Other long term (current) drug therapy: Secondary | ICD-10-CM | POA: Diagnosis not present

## 2018-10-27 DIAGNOSIS — E669 Obesity, unspecified: Secondary | ICD-10-CM | POA: Diagnosis not present

## 2018-10-27 DIAGNOSIS — E119 Type 2 diabetes mellitus without complications: Secondary | ICD-10-CM | POA: Diagnosis not present

## 2018-10-27 DIAGNOSIS — I2510 Atherosclerotic heart disease of native coronary artery without angina pectoris: Secondary | ICD-10-CM | POA: Diagnosis not present

## 2018-10-27 DIAGNOSIS — Z7982 Long term (current) use of aspirin: Secondary | ICD-10-CM | POA: Diagnosis not present

## 2018-10-27 DIAGNOSIS — K219 Gastro-esophageal reflux disease without esophagitis: Secondary | ICD-10-CM | POA: Diagnosis not present

## 2018-10-27 DIAGNOSIS — N201 Calculus of ureter: Secondary | ICD-10-CM | POA: Diagnosis not present

## 2018-10-27 DIAGNOSIS — N1330 Unspecified hydronephrosis: Secondary | ICD-10-CM | POA: Diagnosis not present

## 2018-10-27 DIAGNOSIS — E7800 Pure hypercholesterolemia, unspecified: Secondary | ICD-10-CM | POA: Diagnosis not present

## 2018-10-27 DIAGNOSIS — I10 Essential (primary) hypertension: Secondary | ICD-10-CM | POA: Diagnosis not present

## 2018-10-27 DIAGNOSIS — Z955 Presence of coronary angioplasty implant and graft: Secondary | ICD-10-CM | POA: Diagnosis not present

## 2018-10-27 DIAGNOSIS — Z7901 Long term (current) use of anticoagulants: Secondary | ICD-10-CM | POA: Diagnosis not present

## 2018-10-27 DIAGNOSIS — J45909 Unspecified asthma, uncomplicated: Secondary | ICD-10-CM | POA: Diagnosis not present

## 2018-10-27 DIAGNOSIS — I252 Old myocardial infarction: Secondary | ICD-10-CM | POA: Diagnosis not present

## 2018-11-03 DIAGNOSIS — N201 Calculus of ureter: Secondary | ICD-10-CM | POA: Diagnosis not present

## 2018-11-03 DIAGNOSIS — N3020 Other chronic cystitis without hematuria: Secondary | ICD-10-CM | POA: Diagnosis not present

## 2018-11-17 DIAGNOSIS — N3020 Other chronic cystitis without hematuria: Secondary | ICD-10-CM | POA: Diagnosis not present

## 2018-11-17 DIAGNOSIS — N201 Calculus of ureter: Secondary | ICD-10-CM | POA: Diagnosis not present

## 2018-11-28 ENCOUNTER — Other Ambulatory Visit: Payer: Self-pay | Admitting: Internal Medicine

## 2018-12-17 ENCOUNTER — Other Ambulatory Visit: Payer: Self-pay | Admitting: Internal Medicine

## 2019-01-20 DIAGNOSIS — E119 Type 2 diabetes mellitus without complications: Secondary | ICD-10-CM | POA: Diagnosis not present

## 2019-01-20 DIAGNOSIS — I10 Essential (primary) hypertension: Secondary | ICD-10-CM | POA: Diagnosis not present

## 2019-01-20 DIAGNOSIS — H6120 Impacted cerumen, unspecified ear: Secondary | ICD-10-CM | POA: Diagnosis not present

## 2019-01-20 DIAGNOSIS — I2510 Atherosclerotic heart disease of native coronary artery without angina pectoris: Secondary | ICD-10-CM | POA: Diagnosis not present

## 2019-02-19 ENCOUNTER — Other Ambulatory Visit: Payer: Self-pay | Admitting: Internal Medicine

## 2019-03-03 ENCOUNTER — Other Ambulatory Visit: Payer: Self-pay | Admitting: Internal Medicine

## 2019-03-17 DIAGNOSIS — E119 Type 2 diabetes mellitus without complications: Secondary | ICD-10-CM | POA: Diagnosis not present

## 2019-03-22 ENCOUNTER — Other Ambulatory Visit: Payer: Self-pay | Admitting: Internal Medicine

## 2019-03-27 ENCOUNTER — Other Ambulatory Visit: Payer: Self-pay | Admitting: Internal Medicine

## 2019-03-30 ENCOUNTER — Other Ambulatory Visit: Payer: Self-pay

## 2019-03-30 ENCOUNTER — Encounter: Payer: Self-pay | Admitting: Internal Medicine

## 2019-03-30 ENCOUNTER — Ambulatory Visit (INDEPENDENT_AMBULATORY_CARE_PROVIDER_SITE_OTHER): Payer: BC Managed Care – PPO | Admitting: Internal Medicine

## 2019-03-30 VITALS — BP 154/96 | HR 87 | Temp 97.9°F | Ht 70.0 in | Wt 215.2 lb

## 2019-03-30 DIAGNOSIS — I2510 Atherosclerotic heart disease of native coronary artery without angina pectoris: Secondary | ICD-10-CM

## 2019-03-30 DIAGNOSIS — I10 Essential (primary) hypertension: Secondary | ICD-10-CM | POA: Diagnosis not present

## 2019-03-30 DIAGNOSIS — Z0289 Encounter for other administrative examinations: Secondary | ICD-10-CM

## 2019-03-30 DIAGNOSIS — E785 Hyperlipidemia, unspecified: Secondary | ICD-10-CM

## 2019-03-30 LAB — LIPID PANEL
Chol/HDL Ratio: 4.7 ratio (ref 0.0–5.0)
Cholesterol, Total: 166 mg/dL (ref 100–199)
HDL: 35 mg/dL — ABNORMAL LOW (ref >39)
LDL Chol Calc (NIH): 108 mg/dL — ABNORMAL HIGH (ref 0–99)
Triglycerides: 125 mg/dL (ref 0–149)
VLDL Cholesterol Cal: 23 mg/dL (ref 5–40)

## 2019-03-30 NOTE — Progress Notes (Signed)
OFFICE NOTE  Chief Complaint:  Intermittent chest pain  Primary Care Physician: [name], MD  HPI:  Matthew Horton is a 53 y.o. male with PMH of HTN, DM-2, HLD, OSA & long-standing tobacco abuse with known moderate coronary disease by catheterization in March of 2014 for an abnormal stress test. At that time he shown to have moderate two-vessel disease in the diagonal branch of the LAD as well as the RCA. There was also an ostial OM1 lesion. An FFR of the RCA 70% lesion wasn't not significant. Medical therapy was recommended, The patient continued to have intermittent episodes of chest discomfort since that time. Despite being on aggressive therapy, he presented again on the morning of March 21, 2013 with substernal chest pain like a brick sitting on his chest. This was relieved by nitroglycerin. His EKG demonstrated no acute changes. He was admitted and ruled out for MI with negative troponins x 3. However, considering his history and high likelihood for progression of disease, a relook cath was recommended. This was performed by Dr. Herbie Baltimore, via the right radial artery. He was found to have severe 2 site CAD in the mid RCA and mid PDA, demonstrating significant progression of disease since March of 2014. Both lesions were treated with DES. He had normal LVEF and EDP. He tolerated the procedure well but was noted to have post PCI atrial fibrillation with RVR. He left the cath lab in stable condition. He had no further A-fib on telemetry. He remained CP free. The right radial access site remained stable. His labs were stable. He did have mild hypertension and was started on a low dose ACE-I. He was resumed on his BB and statin. He was placed on DAPT with ASA and Brilinta.  He returns today in follow-up and is feeling well. His radial cath site has a good pulse and there is good distal sensation and perfusion. He has had no further chest pain and no significant DOE.  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  He reports that he has stopped smoking with this recent hospitalization.  Matthew Horton was recently hospitalized again for chest pain, he underwent repeat cardiac catheterization which showed the folllowing:  Normal LV function   Two-vessel cardiac disease with previously noted 7080% stenosis in the diagonal branch of the LAD the very small superior branch of this diagonal vessel with 95% ostial stenosis and evidence for 30% proximal LAD narrowing as well as 80% mid LAD intramyocardial segment with mid systolic bridging which did improve following IC nitroglycerin administration; 30% narrowing in the distal circumflex marginal vessel; and widely patent mid and distal RCA stents with distal inferior LV branch diffuse stenoses/spasm and a small distal vessel   Previously, the patient has been demonstrated to have diagonal stenoses with normal FFR. The present diagonal stenosis appears 80% but visually is the same as previously. He also has evidence for mid systolic bridging of the mid LAD and spasm/diffuse stenoses of an inferior distal branch of his RCA. The previously placed RCA stents are widely patent. The patient will be treated with more aggressive medical regimen including nitrates as well as amlodipine. Smoking cessation is imperative. Consideration for nuclear imaging to assess for anterolateral ischemia may be helpful if recurrent symptomatology develops.  He followed up in our office postcardiac catheterization and reported some persistent sharp electric quick chest pains in the upper midepigastric area. He was started  on low-dose amlodipine for possible coronary spasm and he reported initially his symptoms improved significantly however recently they picked up again. His EKG shows no acute changes. He has not taken nitroglycerin because it "makes him feel bad".   I saw Matthew Horton back in the office today. He reports in  March of this year he went to see another cardiologist, [facility], for a second opinion regarding persistent chest discomfort. Dr. Hedy JacobGrainger felt that this was atypical for coronary artery disease and ordered a treadmill stress test. This was abnormal which led to a stress echocardiogram. This also was interpreted as mildly abnormal and ultimately he had a repeat cardiac catheterization. Per Matthew Horton's report this was negative for any new obstructive coronary disease. He was placed then on omeprazole and his symptoms improved almost immediately. I've surged on care everywhere for the cath report however cannot seem to find it. We will request those records from [facility].  03/06/2016  Matthew Horton returns today for follow-up. In January he saw Tereso NewcomerScott Weaver, PA-C, who had evaluated him for persistent chest discomfort. The symptoms are somewhat atypical and more consistent with reflux. However he was restarted on Protonix and given isosorbide. He said he took the medication but felt "funny in the head", and then it was discontinued. He reports being compliant with pen Toprol resolved. He still gets episodes chest discomfort. He reports his blood sugars are fairly well controlled with an A1c of 6.1 that is unchanged despite more than 20 pound weight loss. He is currently at 200 pounds with a BMI of 28. Blood pressures on the low normal side at 106/70. EKG today shows sinus rhythm with no ischemia.  03/11/2017  Matthew Horton was seen today in follow-up. He had had no issues over the past year. He denies chest pain, dyspnea or any palpitations. He says he has some pain in his right neck - it feels like a pulsation. Not worse when turning his neck - it is sharp and comes and goes. BP appears controlled today. He has not had a recent lipid profile in our system, but had an LDL-C of 105 in 2015. His weight is up some again and we discussed the importance of weight loss. 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 He says he will need a stress test to accomplish this.  03/10/2018  Matthew Horton was seen today in follow-up.  Is been a year since I last saw him.  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  He underwent stress testing last year which was negative for ischemia.  He says he will need repeat testing every other year.  He has had minimal weight loss.  His diet is less than optimal and higher in saturated fats.  Recent LDL cholesterol in May was 91 with goal LDL less than 70.  He also reports this continued globus sensation in the neck.  Carotid Dopplers were negative.  He has some upper abdominal tightness which he feels like it is in his chest, however he points to his upper abdomen.  He says this happens mostly in the mornings and may represent reflux.  He was previously on Protonix however due to the cost of the medicine he stopped taking it.  He said it was the only medicine that "work for him".  03/30/2019  Matthew Horton returns today for follow-up.  He reports some intermittent chest discomfort.  He says it feels like sharp pains that  occur at rest or could be with exertion.  He is not managed to lose much weight.  He is try to cut back his eating but not change the quality of his foods.  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  He also has intermittent reflux symptoms and reports compliance with Protonix.  Blood pressure was elevated today however he did not take his medicines this morning.  PMHx:  Past Medical History:  Diagnosis Date  . Atrial fibrillation ([place])    a. Transient during adm 03/2013 for PCI.   Marland Kitchen CKD (chronic kidney disease) stage 2, GFR 60-89 ml/min   . Cluster headache   . Coronary artery disease    a. s/p DES x 2 to RCA 03/2013 - residual disease for med rx.  . DM2 (diabetes mellitus, type 2) ([place])   . HLD (hyperlipidemia)   . HTN (hypertension)   . Low testosterone   . Nonallopathic  lesion of cervical region   . OSA (obstructive sleep apnea)   . Tobacco abuse   . Torticollis, acute     Past Surgical History:  Procedure Laterality Date  . CARDIAC CATHETERIZATION  08/08/2013  . CORONARY ANGIOPLASTY WITH STENT PLACEMENT  03/22/2013   "2 stents", Dr. Roni Bread  . FRACTIONAL FLOW RESERVE WIRE Right 03/22/2013   Procedure: FRACTIONAL FLOW RESERVE WIRE;  Surgeon: Leonie Man, MD;  Location: [facility] CATH LAB;  Service: Cardiovascular;  Laterality: Right;  . LEFT HEART CATHETERIZATION WITH CORONARY ANGIOGRAM N/A 08/16/2012   Procedure: LEFT HEART CATHETERIZATION WITH CORONARY ANGIOGRAM;  Surgeon: Lorretta Harp, MD;  Location: [facility] CATH LAB;  Service: Cardiovascular;  Laterality: N/A;  . LEFT HEART CATHETERIZATION WITH CORONARY ANGIOGRAM N/A 03/22/2013   Procedure: LEFT HEART CATHETERIZATION WITH CORONARY ANGIOGRAM;  Surgeon: Leonie Man, MD;  Location: [facility] CATH LAB;  Service: Cardiovascular;  Laterality: N/A;  . LEFT HEART CATHETERIZATION WITH CORONARY ANGIOGRAM N/A 08/08/2013   Procedure: LEFT HEART CATHETERIZATION WITH CORONARY ANGIOGRAM;  Surgeon: Troy Sine, MD;  Location: [facility] CATH LAB;  Service: Cardiovascular;  Laterality: N/A;  . PERCUTANEOUS CORONARY STENT INTERVENTION (PCI-S) Right 03/22/2013   Procedure: PERCUTANEOUS CORONARY STENT INTERVENTION (PCI-S);  Surgeon: Leonie Man, MD;  Location: [facility] CATH LAB;  Service: Cardiovascular;  Laterality: Right;  . TONSILLECTOMY    . TRAUMATIC AMPUTATION AND REATTACHMENT Left    Index finger    FAMHx:  Family History  Problem Relation Age of Onset  . Coronary artery disease Father 23       MI and s/p CABG  . Diabetes Father   . Hypertension Father   . Hyperlipidemia Father   . Hyperlipidemia Mother     SOCHx:   reports that he quit smoking about 5 years ago. His smoking use included cigarettes. He has a 30.00 pack-year smoking history. He has never used smokeless tobacco. He reports that he does not drink alcohol or  use drugs.  ALLERGIES:  Allergies  Allergen Reactions  . Ranexa [Ranolazine] Nausea Only and Other (See Comments)    dizziness    ROS: Pertinent items noted in HPI and remainder of comprehensive ROS otherwise negative.  HOME MEDS: Current Outpatient Medications  Medication Sig Dispense Refill  . acetaminophen (TYLENOL) 325 MG tablet Take 1-2 tablets (325-650 mg total) by mouth every 4 (four) hours as needed for mild pain or moderate pain.    Marland Kitchen amLODipine (NORVASC) 5 MG tablet TAKE 1 TABLET BY  MOUTH EVERY DAY 30 tablet 0  . aspirin EC 81 MG tablet Take 81 mg by mouth every morning.     Marland Kitchen atorvastatin (LIPITOR) 40 MG tablet TAKE 1 TABLET (40 MG TOTAL) BY MOUTH DAILY AT 6 PM. 90 tablet 3  . clopidogrel (PLAVIX) 75 MG tablet Take 1 tablet (75 mg total) by mouth daily. Take 1 Tablet BY MOUTH DAILY. 90 tablet 3  . lisinopril (ZESTRIL) 10 MG tablet TAKE 1 TABLET BY MOUTH EVERY DAY 30 tablet 0  . metoprolol tartrate (LOPRESSOR) 50 MG tablet TAKE 1 TABLET BY MOUTH TWICE A DAY 180 tablet 2  . nitroGLYCERIN (NITROSTAT) 0.4 MG SL tablet Place 1 tablet (0.4 mg total) under the tongue every 5 (five) minutes as needed for chest pain. 25 tablet 3  . pantoprazole (PROTONIX) 40 MG tablet TAKE 1 TABLET BY MOUTH EVERY DAY 30 tablet 0  . PROAIR HFA 108 (90 Base) MCG/ACT inhaler As needed    . SitaGLIPtin-MetFORMIN HCl (JANUMET XR) 50-1000 MG TB24 Take 1 tablet by mouth daily. 30 tablet 10   No current facility-administered medications for this visit.     LABS/IMAGING: No results found for this or any previous visit (from the past 48 hour(s)). No results found.  VITALS: BP (!) 154/96   Pulse 87   Temp 97.9 F (36.6 C)   Ht  (1.778 m)   Wt 215 lb 3.2 oz (97.6 kg)   SpO2 94%   BMI 30.88 kg/m   EXAM: General appearance: alert and no distress Neck: no carotid bruit, no JVD and thyroid not enlarged, symmetric, no tenderness/mass/nodules Lungs: clear to auscultation bilaterally Heart:  regular rate and rhythm, S1, S2 normal, no murmur, click, rub or gallop Abdomen: soft, non-tender; bowel sounds normal; no masses,  no organomegaly Extremities: extremities normal, atraumatic, no cyanosis or edema Pulses: 2+ and symmetric Skin: Skin color, texture, turgor normal. No rashes or lesions Neurologic: Grossly normal Psych: Pleasant  EKG: Normal sinus rhythm at 85, possible lateral infarct-personally reviewed  ASSESSMENT: 1. CAD s/p PCI to the mid-RCA and distal PDA with DES (03/2013) -  patent stents by cardiac catheterization at [facility] in 08/2014 2. HTN - controlled 3. Hyperlipidemia 4. Former smoker 5. GERD  6. DOT driver  PLAN: 1.   Matthew Horton has had some atypical sounding chest pain however is due for repeat DOT stress test.  Given his history of prior coronary disease and stents, would recommend an exercise Myoview.  His last study was 2 years ago and negative for ischemia.  Blood pressure was elevated today however he did not take his meds.  In general he says it is fairly well controlled.  He is due for repeat lipid profile which we will order today.  We may need to adjust his medications to target LDL less than 70.  Follow-up with me annually or sooner as necessary.  Chrystie Nose, MD, [facility], FACP  [place]  [facility]  Medical Director of the Advanced Lipid Disorders &  Cardiovascular Risk Reduction Clinic Diplomate of the American Board of Clinical Lipidology Attending Cardiologist  Direct Dial: [phone]  Fax: [phone]  Website:  [email or web address] Nicely [email or web address] 03/30/2019, 8:14 AM

## 2019-03-30 NOTE — Patient Instructions (Addendum)
Medication Instructions:  Your physician recommends that you continue on your current medications as directed. Please refer to the Current Medication list given to you today.  *If you need a refill on your cardiac medications before your next appointment, please call your pharmacy*  Lab Work: FASTING lab work If you have labs (blood work) drawn today and your tests are completely normal, you will receive your results only by: Marland Kitchen MyChart Message (if you have MyChart) OR . A paper copy in the mail If you have any lab test that is abnormal or we need to change your treatment, we will call you to review the results.  Testing/Procedures: Exercise Stress Myoview - to be completed @ [address]  You will need to have the coronavirus test completed prior to your procedure. An appointment has been made at 9:10am on  04/08/19. This is a Drive Up Visit at the [facility] [address] Please tell them that you are there for pre-procedure testing. Someone will direct you to the appropriate testing line. Stay in your car and someone will be with you shortly. Please make sure to have all other labs completed before this test because you will need to stay quarantined until your procedure. Please take your insurance card to this test.    Follow-Up: At [facility], you and your health needs are our priority.  As part of our continuing mission to provide you with exceptional heart care, we have created designated Provider Care Teams.  These Care Teams include your primary Cardiologist (physician) and Advanced Practice Providers (APPs -  Physician Assistants and Nurse Practitioners) who all work together to provide you with the care you need, when you need it.  Your next appointment:   12 months  The format for your next appointment:   In Person  Provider:   You may see Dr. Debara Pickett or one of the following Advanced Practice Providers on your designated Care Team:    Almyra Deforest, PA-C  Fabian Sharp, PA-C or   Roby Lofts, [place]

## 2019-03-31 ENCOUNTER — Telehealth: Payer: Self-pay | Admitting: Internal Medicine

## 2019-03-31 DIAGNOSIS — E785 Hyperlipidemia, unspecified: Secondary | ICD-10-CM

## 2019-03-31 MED ORDER — EZETIMIBE 10 MG PO TABS: 10.0000 mg | ORAL_TABLET | Freq: Every day | ORAL | 3 refills | Status: DC

## 2019-03-31 NOTE — Telephone Encounter (Signed)
Patient called w/results Agreed w/MD plan & recommendations Rx(s) sent to pharmacy electronically. Lab order will be mailed

## 2019-03-31 NOTE — Telephone Encounter (Signed)
-----   Message from Pixie Casino, MD sent at 03/31/2019  9:36 AM EDT ----- LDL above goal <70, add zetia 10 mg daily - advised dietary changes. Repeat lipid in 3 months.  Dr Lemmie Evens

## 2019-04-06 ENCOUNTER — Telehealth (HOSPITAL_COMMUNITY): Payer: Self-pay

## 2019-04-06 NOTE — Telephone Encounter (Signed)
I will attempt to reach patient at a later time. Encounter complete. 

## 2019-04-07 ENCOUNTER — Telehealth (HOSPITAL_COMMUNITY): Payer: Self-pay

## 2019-04-07 NOTE — Telephone Encounter (Signed)
Encounter complete. 

## 2019-04-08 ENCOUNTER — Other Ambulatory Visit (HOSPITAL_COMMUNITY)
Admission: RE | Admit: 2019-04-08 | Discharge: 2019-04-08 | Disposition: A | Discharge status: Home / Self Care | Payer: BC Managed Care – PPO | Source: Ambulatory Visit | Attending: Internal Medicine | Admitting: Internal Medicine

## 2019-04-08 DIAGNOSIS — Z20828 Contact with and (suspected) exposure to other viral communicable diseases: Secondary | ICD-10-CM | POA: Insufficient documentation

## 2019-04-08 DIAGNOSIS — Z01812 Encounter for preprocedural laboratory examination: Secondary | ICD-10-CM | POA: Diagnosis not present

## 2019-04-09 LAB — NOVEL CORONAVIRUS, NAA (HOSP ORDER, SEND-OUT TO REF LAB; TAT 18-24 HRS): SARS-CoV-2, NAA: NOT DETECTED

## 2019-04-11 ENCOUNTER — Other Ambulatory Visit: Payer: Self-pay

## 2019-04-11 ENCOUNTER — Ambulatory Visit (HOSPITAL_COMMUNITY)
Admission: RE | Admit: 2019-04-11 | Discharge: 2019-04-11 | Disposition: A | Discharge status: Home / Self Care | Payer: BC Managed Care – PPO | Source: Ambulatory Visit | Attending: Cardiovascular Disease | Admitting: Cardiovascular Disease

## 2019-04-11 DIAGNOSIS — I2510 Atherosclerotic heart disease of native coronary artery without angina pectoris: Secondary | ICD-10-CM | POA: Insufficient documentation

## 2019-04-11 DIAGNOSIS — Z0289 Encounter for other administrative examinations: Secondary | ICD-10-CM | POA: Insufficient documentation

## 2019-04-11 LAB — MYOCARDIAL PERFUSION IMAGING
LV dias vol: 136 mL (ref 62–150)
LV sys vol: 70 mL
Peak HR: 109 {beats}/min
Rest HR: 102 {beats}/min
SDS: 4
SRS: 3
SSS: 7
TID: 1.25

## 2019-04-11 MED ORDER — TECHNETIUM TC 99M TETROFOSMIN IV KIT
10.9000 | PACK | Freq: Once | INTRAVENOUS | Status: AC | PRN
  Administered 2019-04-11: 10.9 via INTRAVENOUS
  Filled 2019-04-11: qty 11

## 2019-04-11 MED ORDER — REGADENOSON 0.4 MG/5ML IV SOLN
0.4000 mg | Freq: Once | INTRAVENOUS | Status: AC
  Administered 2019-04-11: 0.4 mg via INTRAVENOUS

## 2019-04-11 MED ORDER — TECHNETIUM TC 99M TETROFOSMIN IV KIT
31.4000 | PACK | Freq: Once | INTRAVENOUS | Status: AC | PRN
  Administered 2019-04-11: 31.4 via INTRAVENOUS
  Filled 2019-04-11: qty 32

## 2019-04-12 ENCOUNTER — Telehealth: Payer: Self-pay | Admitting: Internal Medicine

## 2019-04-12 DIAGNOSIS — Z0289 Encounter for other administrative examinations: Secondary | ICD-10-CM

## 2019-04-12 DIAGNOSIS — I2510 Atherosclerotic heart disease of native coronary artery without angina pectoris: Secondary | ICD-10-CM

## 2019-04-12 NOTE — Telephone Encounter (Signed)
Notes recorded by Pixie Casino, MD on 04/11/2019 at 9:03 PM EDT  LVEF 48% without ischemia. Previous LVEF on myoview was 58% in 2018. Would advise a limited echo to evaluate possible reduced LVEF vs. Gating error.

## 2019-04-12 NOTE — Telephone Encounter (Signed)
New message   Patient would like results of myocardial results and would like to know if he needs further testing. Please call.

## 2019-04-12 NOTE — Telephone Encounter (Signed)
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 If it is normal, then good to go - if not, will need more work.  Dr Lemmie Evens

## 2019-04-12 NOTE — Telephone Encounter (Addendum)
Patient aware of results. 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

## 2019-04-12 NOTE — Telephone Encounter (Signed)
Patient notified of DOT card advice per MD via MyChart

## 2019-04-14 ENCOUNTER — Other Ambulatory Visit: Payer: Self-pay | Admitting: Internal Medicine

## 2019-04-20 ENCOUNTER — Other Ambulatory Visit: Payer: Self-pay

## 2019-04-20 ENCOUNTER — Ambulatory Visit (HOSPITAL_COMMUNITY): Payer: BC Managed Care – PPO | Attending: Cardiology

## 2019-04-20 DIAGNOSIS — Z0289 Encounter for other administrative examinations: Secondary | ICD-10-CM | POA: Insufficient documentation

## 2019-04-20 DIAGNOSIS — I2510 Atherosclerotic heart disease of native coronary artery without angina pectoris: Secondary | ICD-10-CM | POA: Insufficient documentation

## 2019-04-21 ENCOUNTER — Other Ambulatory Visit: Payer: Self-pay | Admitting: Internal Medicine

## 2019-04-21 ENCOUNTER — Telehealth: Payer: Self-pay | Admitting: Internal Medicine

## 2019-04-21 NOTE — Telephone Encounter (Signed)
Patient really would like to know the results of his echo that was done yesterday. 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

## 2019-04-21 NOTE — Telephone Encounter (Signed)
Rx(s) sent to pharmacy electronically.  

## 2019-04-21 NOTE — Telephone Encounter (Signed)
Called patient, gave results.  ?Patient verbalized understanding.  ? ? ?

## 2019-04-21 NOTE — Telephone Encounter (Signed)
Echo shows low normal LVEF - 50-55%, similar to myoview findings, but a little better. 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  Dr Lemmie Evens

## 2019-04-21 NOTE — Telephone Encounter (Signed)
Please advise- patient is wanting ECHO results, but I do not see resulted by MD yet.

## 2019-05-17 ENCOUNTER — Telehealth: Payer: Self-pay | Admitting: Internal Medicine

## 2019-05-17 ENCOUNTER — Other Ambulatory Visit: Payer: Self-pay | Admitting: Internal Medicine

## 2019-05-17 MED ORDER — ATORVASTATIN CALCIUM 40 MG PO TABS: 40.0000 mg | ORAL_TABLET | Freq: Every day | ORAL | 3 refills | Status: DC

## 2019-05-17 NOTE — Telephone Encounter (Signed)
Patient aware to take both medications. Atorvastatin refilled.

## 2019-05-17 NOTE — Telephone Encounter (Signed)
Pt c/o medication issue:  1. Name of Medication: atorvastatin (LIPITOR) 40 MG tablet ezetimibe (ZETIA) 10 MG tablet  2. How are you currently taking this medication (dosage and times per day)? 1 Tablet by mouth daily of ezetimibe (ZETIA) 10 MG tablet  3. Are you having a reaction (difficulty breathing--STAT)? No  4. What is your medication issue? Patient is calling stating he doesn't know if he is supposed to be taking both cholesterol medications. He was taking both up until yesterday when he ran out of the atorvastatin (LIPITOR) 40 MG tablet. If he is to keep taking both he will need a refill for atorvastatin (LIPITOR) 40 MG tablet. Please Advise.

## 2019-06-25 ENCOUNTER — Telehealth: Payer: Self-pay | Admitting: Physician Assistant

## 2019-06-25 NOTE — Telephone Encounter (Signed)
Pt called stating that his BG is always well controlled. However, he has noticed increased urinary frequency over the past few days causing him to check his BG, which has been significantly elevated: [phone]  He also reports labored breathing which is new for him. He denies neurological changes and abdominal pain, but does report palpitations. I am concerned about early signs of DKA and recommended ER visit. He is hesitant to come to the ER due to COVID. I recommended he touch base with his PCP. His PCP may feel comfortable trying to manage new insulin requirements over the phone. But I expressed to him my concern about DKA and the potential for him to deteriorate quickly. He expressed understanding of the plan.

## 2019-06-26 DIAGNOSIS — R350 Frequency of micturition: Secondary | ICD-10-CM | POA: Diagnosis not present

## 2019-06-26 DIAGNOSIS — K219 Gastro-esophageal reflux disease without esophagitis: Secondary | ICD-10-CM | POA: Diagnosis not present

## 2019-06-26 DIAGNOSIS — R81 Glycosuria: Secondary | ICD-10-CM | POA: Diagnosis not present

## 2019-06-26 DIAGNOSIS — E1165 Type 2 diabetes mellitus with hyperglycemia: Secondary | ICD-10-CM | POA: Diagnosis not present

## 2019-09-21 DIAGNOSIS — N200 Calculus of kidney: Secondary | ICD-10-CM | POA: Diagnosis not present

## 2019-09-21 DIAGNOSIS — E119 Type 2 diabetes mellitus without complications: Secondary | ICD-10-CM | POA: Diagnosis not present

## 2019-09-29 DIAGNOSIS — E1165 Type 2 diabetes mellitus with hyperglycemia: Secondary | ICD-10-CM | POA: Diagnosis not present

## 2019-09-29 DIAGNOSIS — I10 Essential (primary) hypertension: Secondary | ICD-10-CM | POA: Diagnosis not present

## 2020-01-12 ENCOUNTER — Other Ambulatory Visit: Payer: Self-pay | Admitting: Internal Medicine

## 2020-02-22 DIAGNOSIS — Z86010 Personal history of colonic polyps: Secondary | ICD-10-CM | POA: Diagnosis not present

## 2020-02-22 DIAGNOSIS — Z01818 Encounter for other preprocedural examination: Secondary | ICD-10-CM | POA: Diagnosis not present

## 2020-02-28 DIAGNOSIS — Z01818 Encounter for other preprocedural examination: Secondary | ICD-10-CM | POA: Diagnosis not present

## 2020-03-01 DIAGNOSIS — Z1211 Encounter for screening for malignant neoplasm of colon: Secondary | ICD-10-CM | POA: Diagnosis not present

## 2020-03-01 DIAGNOSIS — E119 Type 2 diabetes mellitus without complications: Secondary | ICD-10-CM | POA: Diagnosis not present

## 2020-03-01 DIAGNOSIS — Z86010 Personal history of colonic polyps: Secondary | ICD-10-CM | POA: Diagnosis not present

## 2020-03-01 DIAGNOSIS — K635 Polyp of colon: Secondary | ICD-10-CM | POA: Diagnosis not present

## 2020-03-08 DIAGNOSIS — K635 Polyp of colon: Secondary | ICD-10-CM | POA: Diagnosis not present

## 2020-03-21 DIAGNOSIS — D3132 Benign neoplasm of left choroid: Secondary | ICD-10-CM | POA: Diagnosis not present

## 2020-03-29 ENCOUNTER — Other Ambulatory Visit: Payer: Self-pay

## 2020-03-29 ENCOUNTER — Ambulatory Visit (INDEPENDENT_AMBULATORY_CARE_PROVIDER_SITE_OTHER): Payer: BC Managed Care – PPO | Admitting: Internal Medicine

## 2020-03-29 ENCOUNTER — Encounter: Payer: Self-pay | Admitting: Internal Medicine

## 2020-03-29 VITALS — BP 130/84 | HR 78 | Ht 71.0 in | Wt 199.0 lb

## 2020-03-29 DIAGNOSIS — E785 Hyperlipidemia, unspecified: Secondary | ICD-10-CM

## 2020-03-29 DIAGNOSIS — Z0289 Encounter for other administrative examinations: Secondary | ICD-10-CM | POA: Diagnosis not present

## 2020-03-29 DIAGNOSIS — I10 Essential (primary) hypertension: Secondary | ICD-10-CM

## 2020-03-29 DIAGNOSIS — I25119 Atherosclerotic heart disease of native coronary artery with unspecified angina pectoris: Secondary | ICD-10-CM

## 2020-03-29 MED ORDER — NITROGLYCERIN 0.4 MG SL SUBL: 0.4000 mg | SUBLINGUAL_TABLET | SUBLINGUAL | 3 refills | Status: DC | PRN

## 2020-03-29 MED ORDER — ALBUTEROL SULFATE HFA 108 (90 BASE) MCG/ACT IN AERS: 1.0000 | INHALATION_SPRAY | Freq: Four times a day (QID) | RESPIRATORY_TRACT | 0 refills | Status: DC | PRN

## 2020-03-29 NOTE — Progress Notes (Signed)
OFFICE NOTE  Chief Complaint:  Occasional wheezing  Primary Care Physician: Matthew Northern, MD  HPI:  Matthew Horton is a 54 y.o. male with PMH of HTN, DM-2, HLD, OSA & long-standing tobacco abuse with known moderate coronary disease by catheterization in March of 2014 for an abnormal stress test. At that time he shown to have moderate two-vessel disease in the diagonal branch of the LAD as well as the RCA. There was also an ostial OM1 lesion. An FFR of the RCA 70% lesion wasn't not significant. Medical therapy was recommended, The patient continued to have intermittent episodes of chest discomfort since that time. Despite being on aggressive therapy, he presented again on the morning of March 21, 2013 with substernal chest pain like a brick sitting on his chest. This was relieved by nitroglycerin. His EKG demonstrated no acute changes. He was admitted and ruled out for MI with negative troponins x 3. However, considering his history and high likelihood for progression of disease, a relook cath was recommended. This was performed by Dr. Herbie Horton, via the right radial artery. He was found to have severe 2 site CAD in the mid RCA and mid PDA, demonstrating significant progression of disease since March of 2014. Both lesions were treated with DES. He had normal LVEF and EDP. He tolerated the procedure well but was noted to have post PCI atrial fibrillation with RVR. He left the cath lab in stable condition. He had no further A-fib on telemetry. He remained CP free. The right radial access site remained stable. His labs were stable. He did have mild hypertension and was started on a low dose ACE-I. He was resumed on his BB and statin. He was placed on DAPT with ASA and Brilinta.  He returns today in follow-up and is feeling well. His radial cath site has a good pulse and there is good distal sensation and perfusion. He has had no further chest pain and no significant DOE.  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  He reports that he has stopped smoking with this recent hospitalization.  Matthew Horton was recently hospitalized again for chest pain, he underwent repeat cardiac catheterization which showed the folllowing:  Normal LV function   Two-vessel cardiac disease with previously noted 7080% stenosis in the diagonal branch of the LAD the very small superior branch of this diagonal vessel with 95% ostial stenosis and evidence for 30% proximal LAD narrowing as well as 80% mid LAD intramyocardial segment with mid systolic bridging which did improve following IC nitroglycerin administration; 30% narrowing in the distal circumflex marginal vessel; and widely patent mid and distal RCA stents with distal inferior LV branch diffuse stenoses/spasm and a small distal vessel   Previously, the patient has been demonstrated to have diagonal stenoses with normal FFR. The present diagonal stenosis appears 80% but visually is the same as previously. He also has evidence for mid systolic bridging of the mid LAD and spasm/diffuse stenoses of an inferior distal branch of his RCA. The previously placed RCA stents are widely patent. The patient will be treated with more aggressive medical regimen including nitrates as well as amlodipine. Smoking cessation is imperative. Consideration for nuclear imaging to assess for anterolateral ischemia may be helpful if recurrent symptomatology develops.  He followed up in our office postcardiac catheterization and reported some persistent sharp electric quick chest pains in the upper midepigastric area. He was started on  low-dose amlodipine for possible coronary spasm and he reported initially his symptoms improved significantly however recently they picked up again. His EKG shows no acute changes. He has not taken nitroglycerin because it "makes him feel bad".   I saw Matthew Horton back in the office today. He reports in March of  this year he went to see another cardiologist, Dr. Hedy Horton at [facility], for a second opinion regarding persistent chest discomfort. Dr. Hedy Horton felt that this was atypical for coronary artery disease and ordered a treadmill stress test. This was abnormal which led to a stress echocardiogram. This also was interpreted as mildly abnormal and ultimately he had a repeat cardiac catheterization. Per Matthew Horton's report this was negative for any new obstructive coronary disease. He was placed then on omeprazole and his symptoms improved almost immediately. I've surged on care everywhere for the cath report however cannot seem to find it. We will request those records from [facility].  03/06/2016  Matthew Horton returns today for follow-up. In January he saw Matthew Horton, Matthew Horton, who had evaluated him for persistent chest discomfort. The symptoms are somewhat atypical and more consistent with reflux. However he was restarted on Protonix and given isosorbide. He said he took the medication but felt "funny in the head", and then it was discontinued. He reports being compliant with pen Toprol resolved. He still gets episodes chest discomfort. He reports his blood sugars are fairly well controlled with an A1c of 6.1 that is unchanged despite more than 20 pound weight loss. He is currently at 200 pounds with a BMI of 28. Blood pressures on the low normal side at 106/70. EKG today shows sinus rhythm with no ischemia.  03/11/2017  Matthew Horton was seen today in follow-up. He had had no issues over the past year. He denies chest pain, dyspnea or any palpitations. He says he has some pain in his right neck - it feels like a pulsation. Not worse when turning his neck - it is sharp and comes and goes. BP appears controlled today. He has not had a recent lipid profile in our system, but had an LDL-C of 105 in 2015. His weight is up some again and we discussed the importance of weight loss. 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 He says he will need a stress test to accomplish this.  03/10/2018  Matthew Horton was seen today in follow-up.  Is been a year since I last saw him.  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  He underwent stress testing last year which was negative for ischemia.  He says he will need repeat testing every other year.  He has had minimal weight loss.  His diet is less than optimal and higher in saturated fats.  Recent LDL cholesterol in May was 91 with goal LDL less than 70.  He also reports this continued globus sensation in the neck.  Carotid Dopplers were negative.  He has some upper abdominal tightness which he feels like it is in his chest, however he points to his upper abdomen.  He says this happens mostly in the mornings and may represent reflux.  He was previously on Protonix however due to the cost of the medicine he stopped taking it.  He said it was the only medicine that "work for him".  03/30/2019  Matthew Horton returns today for follow-up.  He reports some intermittent chest discomfort.  He says it feels like sharp pains that occur  at rest or could be with exertion.  He is not managed to lose much weight.  He is try to cut back his eating but not change the quality of his foods.  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  He also has intermittent reflux symptoms and reports compliance with Protonix.  Blood pressure was elevated today however he did not take his medicines this morning.  03/29/2020  Matthew Horton returns for routine follow-up.  He says he has been off this week as his wife had cataract surgery.  He continues to require DOT driving license.  He had stress testing last year which was negative for ischemia but showed a mildly reduced LVEF of 48%.  A repeat echo then showed low normal LVEF of 50 to 55%.  He gets occasional shortness of breath with some wheezing.  He says it gets better with an inhaler and is  requesting a refill of his albuterol.  I suspect he might have some COPD however he says it is asthma.  He was a smoker.  I encouraged him to follow-up with his PCP to get pulmonary function testing.  Blood pressure is normal today.  He denies any chest pain.  EKG shows normal sinus rhythm.  His last LDL was 108 in 2020.  He did say last year that his hemoglobin A1c went above 10 but then subsequently he worked to lower his blood sugar with weight loss and had come down about 20 pounds.  A1c as of April of this year was 6.6.  PMHx:  Past Medical History:  Diagnosis Date   Atrial fibrillation (HCC)    a. Transient during adm 03/2013 for PCI.    CKD (chronic kidney disease) stage 2, GFR 60-89 ml/min    Cluster headache    Coronary artery disease    a. s/p DES x 2 to RCA 03/2013 - residual disease for med rx.   DM2 (diabetes mellitus, type 2) (HCC)    HLD (hyperlipidemia)    HTN (hypertension)    Low testosterone    Nonallopathic lesion of cervical region    OSA (obstructive sleep apnea)    Tobacco abuse    Torticollis, acute     Past Surgical History:  Procedure Laterality Date   CARDIAC CATHETERIZATION  08/08/2013   CORONARY ANGIOPLASTY WITH STENT PLACEMENT  03/22/2013   "2 stents", Dr. Ranae Palms. Harding   FRACTIONAL FLOW RESERVE WIRE Right 03/22/2013   Procedure: FRACTIONAL FLOW RESERVE WIRE;  Surgeon: Marykay Lexavid W Harding, MD;  [facility];  Service: Cardiovascular;  Laterality: Right;   LEFT HEART CATHETERIZATION WITH CORONARY ANGIOGRAM N/A 08/16/2012   Procedure: LEFT HEART CATHETERIZATION WITH CORONARY ANGIOGRAM;  Surgeon: Runell GessJonathan J Berry, MD;  [facility];  Service: Cardiovascular;  Laterality: N/A;   LEFT HEART CATHETERIZATION WITH CORONARY ANGIOGRAM N/A 03/22/2013   Procedure: LEFT HEART CATHETERIZATION WITH CORONARY ANGIOGRAM;  Surgeon: Marykay Lexavid W Harding, MD;  [facility];  Service: Cardiovascular;  Laterality: N/A;   LEFT HEART CATHETERIZATION  WITH CORONARY ANGIOGRAM N/A 08/08/2013   Procedure: LEFT HEART CATHETERIZATION WITH CORONARY ANGIOGRAM;  Surgeon: Lennette Biharihomas A Kelly, MD;  [facility];  Service: Cardiovascular;  Laterality: N/A;   PERCUTANEOUS CORONARY STENT INTERVENTION (PCI-S) Right 03/22/2013   Procedure: PERCUTANEOUS CORONARY STENT INTERVENTION (PCI-S);  Surgeon: Marykay Lexavid W Harding, MD;  [facility];  Service: Cardiovascular;  Laterality: Right;   TONSILLECTOMY  TRAUMATIC AMPUTATION AND REATTACHMENT Left    Index finger    FAMHx:  Family History  Problem Relation Age of Onset   Coronary artery disease Father 88       MI and s/p CABG   Diabetes Father    Hypertension Father    Hyperlipidemia Father    Hyperlipidemia Mother     SOCHx:   reports that he quit smoking about 6 years ago. His smoking use included cigarettes. He has a 30.00 pack-year smoking history. He has never used smokeless tobacco. He reports that he does not drink alcohol and does not use drugs.  ALLERGIES:  Allergies  Allergen Reactions   Ranexa [Ranolazine] Nausea Only and Other (See Comments)    dizziness    ROS: Pertinent items noted in HPI and remainder of comprehensive ROS otherwise negative.  HOME MEDS: Current Outpatient Medications  Medication Sig Dispense Refill   acetaminophen (TYLENOL) 325 MG tablet Take 1-2 tablets (325-650 mg total) by mouth every 4 (four) hours as needed for mild pain or moderate pain.     amLODipine (NORVASC) 5 MG tablet Take 1 tablet (5 mg total) by mouth daily. 90 tablet 3   aspirin EC 81 MG tablet Take 81 mg by mouth every morning.      atorvastatin (LIPITOR) 40 MG tablet TAKE 1 TABLET (40 MG TOTAL) BY MOUTH DAILY AT 6 PM. 90 tablet 3   atorvastatin (LIPITOR) 40 MG tablet Take 1 tablet (40 mg total) by mouth daily at 6 PM. 90 tablet 3   clopidogrel (PLAVIX) 75 MG tablet TAKE 1 TABLET BY MOUTH EVERY DAY 90 tablet 3   lisinopril (ZESTRIL) 10 MG tablet Take 1 tablet (10 mg  total) by mouth daily. 90 tablet 3   metoprolol tartrate (LOPRESSOR) 50 MG tablet TAKE 1 TABLET BY MOUTH TWICE A DAY 180 tablet 0   nitroGLYCERIN (NITROSTAT) 0.4 MG SL tablet Place 1 tablet (0.4 mg total) under the tongue every 5 (five) minutes as needed for chest pain. 25 tablet 3   pantoprazole (PROTONIX) 40 MG tablet Take 1 tablet (40 mg total) by mouth daily. 90 tablet 3   PROAIR HFA 108 (90 Base) MCG/ACT inhaler As needed     SitaGLIPtin-MetFORMIN HCl (JANUMET XR) 50-1000 MG TB24 Take 1 tablet by mouth daily. 30 tablet 10   ezetimibe (ZETIA) 10 MG tablet Take 1 tablet (10 mg total) by mouth daily. 90 tablet 3   SILDENAFIL CITRATE PO 30 mg.     No current facility-administered medications for this visit.    LABS/IMAGING: No results found for this or any previous visit (from the past 48 hour(s)). No results found.  VITALS: BP 130/84    Pulse 78    Ht 5\' 11"  (1.803 m)    Wt 199 lb (90.3 kg)    SpO2 97%    BMI 27.75 kg/m   EXAM: General appearance: alert and no distress Neck: no carotid bruit, no JVD and thyroid not enlarged, symmetric, no tenderness/mass/nodules Lungs: clear to auscultation bilaterally Heart: regular rate and rhythm, S1, S2 normal, no murmur, click, rub or gallop Abdomen: soft, non-tender; bowel sounds normal; no masses,  no organomegaly Extremities: extremities normal, atraumatic, no cyanosis or edema Pulses: 2+ and symmetric Skin: Skin color, texture, turgor normal. No rashes or lesions Neurologic: Grossly normal Psych: Pleasant  EKG: Normal sinus rhythm 78, incomplete right bundle branch block-personally reviewed his  ASSESSMENT: 1. CAD s/p PCI to the mid-RCA and distal PDA with DES (03/2013) -  patent stents by cardiac catheterization at [facility] in 08/2014 2. HTN - controlled 3. Hyperlipidemia 4. Former smoker with ?asthma, suspect COPD 5. GERD  6. DOT driver 7. DM2- A1c 6.6  PLAN: 1.   Matthew Horton denies any recurrent significant chest pain.   Blood pressure is controlled.  His cholesterol is higher than target however he has recently lost weight.  This will need to be reassessed.  His diabetes was uncontrolled earlier but A1c was 6.6 in April.  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  He had stress testing last year which was negative for ischemia.  He reports some wheezing.  He had a history of smoking and was diagnosed with asthma however I suspect this might be COPD.  He cannot tell me when he last had pulmonary function testing.  This is coordinated by his primary care provider.  I encouraged him to follow-up to reevaluate that.  He is asking for an inhaler refill today which I will provide as well as a renewal of his nitroglycerin which she says he carries with him but has never used.  Follow-up with me annually or sooner as necessary.  Chrystie Nose, MD, [facility], FACP  [place]   [facility]  Medical Director of the Advanced Lipid Disorders &  Cardiovascular Risk Reduction Clinic Diplomate of the American Board of Clinical Lipidology Attending Cardiologist  Direct Dial: [phone]   Fax: [phone]  Website:  [email or web address] Nicely Taron Conrey 03/29/2020, 1:38 PM

## 2020-03-29 NOTE — Patient Instructions (Signed)
Medication Instructions:  Your physician recommends that you continue on your current medications as directed. Please refer to the Current Medication list given to you today.  *If you need a refill on your cardiac medications before your next appointment, please call your pharmacy*   Lab Work: NONE If you have labs (blood work) drawn today and your tests are completely normal, you will receive your results only by: Marland Kitchen MyChart Message (if you have MyChart) OR . A paper copy in the mail If you have any lab test that is abnormal or we need to change your treatment, we will call you to review the results.   Testing/Procedures: Lexiscan Myoview Stress Test in 1 year @ [address]   Follow-Up: At [facility], you and your health needs are our priority.  As part of our continuing mission to provide you with exceptional heart care, we have created designated Provider Care Teams.  These Care Teams include your primary Cardiologist (physician) and Advanced Practice Providers (APPs -  Physician Assistants and Nurse Practitioners) who all work together to provide you with the care you need, when you need it.  We recommend signing up for the patient portal called "MyChart".  Sign up information is provided on this After Visit Summary.  MyChart is used to connect with patients for Virtual Visits (Telemedicine).  Patients are able to view lab/test results, encounter notes, upcoming appointments, etc.  Non-urgent messages can be sent to your provider as well.   To learn more about what you can do with MyChart, go to [email or web address].    Your next appointment:   12 month(s)  The format for your next appointment:   In Person  Provider:   You may see Chrystie Nose, MD or one of the following Advanced Practice Providers on your designated Care Team:    Azalee Course, PA-C  Micah Flesher, PA-C or   Judy Pimple, [place]    Other Instructions

## 2020-04-03 ENCOUNTER — Other Ambulatory Visit: Payer: Self-pay | Admitting: Internal Medicine

## 2020-04-09 ENCOUNTER — Other Ambulatory Visit: Payer: Self-pay | Admitting: Internal Medicine

## 2020-04-29 ENCOUNTER — Other Ambulatory Visit: Payer: Self-pay | Admitting: Internal Medicine

## 2020-05-12 ENCOUNTER — Other Ambulatory Visit: Payer: Self-pay | Admitting: Internal Medicine

## 2020-05-13 ENCOUNTER — Other Ambulatory Visit: Payer: Self-pay | Admitting: Internal Medicine

## 2020-05-13 DIAGNOSIS — Z1152 Encounter for screening for COVID-19: Secondary | ICD-10-CM | POA: Diagnosis not present

## 2020-05-13 DIAGNOSIS — J069 Acute upper respiratory infection, unspecified: Secondary | ICD-10-CM | POA: Diagnosis not present

## 2020-05-13 NOTE — Telephone Encounter (Signed)
Rx(s) sent to pharmacy electronically.  

## 2020-06-17 DIAGNOSIS — E1122 Type 2 diabetes mellitus with diabetic chronic kidney disease: Secondary | ICD-10-CM | POA: Diagnosis not present

## 2020-06-17 DIAGNOSIS — I2510 Atherosclerotic heart disease of native coronary artery without angina pectoris: Secondary | ICD-10-CM | POA: Diagnosis not present

## 2020-06-17 DIAGNOSIS — I10 Essential (primary) hypertension: Secondary | ICD-10-CM | POA: Diagnosis not present

## 2020-06-17 DIAGNOSIS — N182 Chronic kidney disease, stage 2 (mild): Secondary | ICD-10-CM | POA: Diagnosis not present

## 2020-06-20 DIAGNOSIS — E119 Type 2 diabetes mellitus without complications: Secondary | ICD-10-CM | POA: Diagnosis not present

## 2020-06-20 DIAGNOSIS — Z79899 Other long term (current) drug therapy: Secondary | ICD-10-CM | POA: Diagnosis not present

## 2020-06-20 DIAGNOSIS — R1030 Lower abdominal pain, unspecified: Secondary | ICD-10-CM | POA: Diagnosis not present

## 2020-06-20 DIAGNOSIS — K4021 Bilateral inguinal hernia, without obstruction or gangrene, recurrent: Secondary | ICD-10-CM | POA: Diagnosis not present

## 2020-06-20 DIAGNOSIS — R109 Unspecified abdominal pain: Secondary | ICD-10-CM | POA: Diagnosis not present

## 2020-06-20 DIAGNOSIS — I10 Essential (primary) hypertension: Secondary | ICD-10-CM | POA: Diagnosis not present

## 2020-06-20 DIAGNOSIS — N200 Calculus of kidney: Secondary | ICD-10-CM | POA: Diagnosis not present

## 2020-06-20 DIAGNOSIS — Z7982 Long term (current) use of aspirin: Secondary | ICD-10-CM | POA: Diagnosis not present

## 2020-06-20 DIAGNOSIS — N132 Hydronephrosis with renal and ureteral calculous obstruction: Secondary | ICD-10-CM | POA: Diagnosis not present

## 2020-06-20 DIAGNOSIS — Z7902 Long term (current) use of antithrombotics/antiplatelets: Secondary | ICD-10-CM | POA: Diagnosis not present

## 2020-06-20 DIAGNOSIS — R110 Nausea: Secondary | ICD-10-CM | POA: Diagnosis not present

## 2020-06-20 DIAGNOSIS — N400 Enlarged prostate without lower urinary tract symptoms: Secondary | ICD-10-CM | POA: Diagnosis not present

## 2020-06-20 DIAGNOSIS — K429 Umbilical hernia without obstruction or gangrene: Secondary | ICD-10-CM | POA: Diagnosis not present

## 2020-06-20 DIAGNOSIS — F17210 Nicotine dependence, cigarettes, uncomplicated: Secondary | ICD-10-CM | POA: Diagnosis not present

## 2020-06-20 DIAGNOSIS — I2510 Atherosclerotic heart disease of native coronary artery without angina pectoris: Secondary | ICD-10-CM | POA: Diagnosis not present

## 2020-06-21 DIAGNOSIS — N132 Hydronephrosis with renal and ureteral calculous obstruction: Secondary | ICD-10-CM | POA: Diagnosis not present

## 2020-06-21 DIAGNOSIS — N400 Enlarged prostate without lower urinary tract symptoms: Secondary | ICD-10-CM | POA: Diagnosis not present

## 2020-06-21 DIAGNOSIS — K429 Umbilical hernia without obstruction or gangrene: Secondary | ICD-10-CM | POA: Diagnosis not present

## 2020-06-21 DIAGNOSIS — K4021 Bilateral inguinal hernia, without obstruction or gangrene, recurrent: Secondary | ICD-10-CM | POA: Diagnosis not present

## 2020-06-24 DIAGNOSIS — R3129 Other microscopic hematuria: Secondary | ICD-10-CM | POA: Diagnosis not present

## 2020-06-24 DIAGNOSIS — N201 Calculus of ureter: Secondary | ICD-10-CM | POA: Diagnosis not present

## 2020-06-28 ENCOUNTER — Other Ambulatory Visit: Payer: Self-pay | Admitting: Internal Medicine

## 2020-06-28 DIAGNOSIS — Z888 Allergy status to other drugs, medicaments and biological substances status: Secondary | ICD-10-CM | POA: Diagnosis not present

## 2020-06-28 DIAGNOSIS — N132 Hydronephrosis with renal and ureteral calculous obstruction: Secondary | ICD-10-CM | POA: Diagnosis not present

## 2020-06-28 DIAGNOSIS — Z955 Presence of coronary angioplasty implant and graft: Secondary | ICD-10-CM | POA: Diagnosis not present

## 2020-06-28 DIAGNOSIS — Z7901 Long term (current) use of anticoagulants: Secondary | ICD-10-CM | POA: Diagnosis not present

## 2020-06-28 DIAGNOSIS — J45909 Unspecified asthma, uncomplicated: Secondary | ICD-10-CM | POA: Diagnosis not present

## 2020-06-28 DIAGNOSIS — N201 Calculus of ureter: Secondary | ICD-10-CM | POA: Diagnosis not present

## 2020-06-28 DIAGNOSIS — Z87442 Personal history of urinary calculi: Secondary | ICD-10-CM | POA: Diagnosis not present

## 2020-06-28 DIAGNOSIS — E119 Type 2 diabetes mellitus without complications: Secondary | ICD-10-CM | POA: Diagnosis not present

## 2020-06-28 DIAGNOSIS — Z87891 Personal history of nicotine dependence: Secondary | ICD-10-CM | POA: Diagnosis not present

## 2020-06-28 DIAGNOSIS — D759 Disease of blood and blood-forming organs, unspecified: Secondary | ICD-10-CM | POA: Diagnosis not present

## 2020-06-28 DIAGNOSIS — I10 Essential (primary) hypertension: Secondary | ICD-10-CM | POA: Diagnosis not present

## 2020-06-28 DIAGNOSIS — R3129 Other microscopic hematuria: Secondary | ICD-10-CM | POA: Diagnosis not present

## 2020-06-28 DIAGNOSIS — K219 Gastro-esophageal reflux disease without esophagitis: Secondary | ICD-10-CM | POA: Diagnosis not present

## 2020-06-28 DIAGNOSIS — I2510 Atherosclerotic heart disease of native coronary artery without angina pectoris: Secondary | ICD-10-CM | POA: Diagnosis not present

## 2020-07-05 DIAGNOSIS — I2510 Atherosclerotic heart disease of native coronary artery without angina pectoris: Secondary | ICD-10-CM | POA: Diagnosis not present

## 2020-07-05 DIAGNOSIS — E119 Type 2 diabetes mellitus without complications: Secondary | ICD-10-CM | POA: Diagnosis not present

## 2020-07-05 DIAGNOSIS — Z888 Allergy status to other drugs, medicaments and biological substances status: Secondary | ICD-10-CM | POA: Diagnosis not present

## 2020-07-05 DIAGNOSIS — N132 Hydronephrosis with renal and ureteral calculous obstruction: Secondary | ICD-10-CM | POA: Diagnosis not present

## 2020-07-05 DIAGNOSIS — I10 Essential (primary) hypertension: Secondary | ICD-10-CM | POA: Diagnosis not present

## 2020-07-05 DIAGNOSIS — N200 Calculus of kidney: Secondary | ICD-10-CM | POA: Diagnosis not present

## 2020-07-05 DIAGNOSIS — Z87891 Personal history of nicotine dependence: Secondary | ICD-10-CM | POA: Diagnosis not present

## 2020-07-16 DIAGNOSIS — N200 Calculus of kidney: Secondary | ICD-10-CM | POA: Diagnosis not present

## 2020-07-16 DIAGNOSIS — R3129 Other microscopic hematuria: Secondary | ICD-10-CM | POA: Diagnosis not present

## 2020-07-27 DIAGNOSIS — K046 Periapical abscess with sinus: Secondary | ICD-10-CM | POA: Diagnosis not present

## 2020-08-01 ENCOUNTER — Other Ambulatory Visit: Payer: Self-pay | Admitting: Internal Medicine

## 2020-08-08 DIAGNOSIS — H6123 Impacted cerumen, bilateral: Secondary | ICD-10-CM | POA: Diagnosis not present

## 2020-08-13 DIAGNOSIS — Z20822 Contact with and (suspected) exposure to covid-19: Secondary | ICD-10-CM | POA: Diagnosis not present

## 2020-08-13 DIAGNOSIS — Z01812 Encounter for preprocedural laboratory examination: Secondary | ICD-10-CM | POA: Diagnosis not present

## 2020-08-16 DIAGNOSIS — Z9089 Acquired absence of other organs: Secondary | ICD-10-CM | POA: Diagnosis not present

## 2020-08-16 DIAGNOSIS — I10 Essential (primary) hypertension: Secondary | ICD-10-CM | POA: Diagnosis not present

## 2020-08-16 DIAGNOSIS — Z87891 Personal history of nicotine dependence: Secondary | ICD-10-CM | POA: Diagnosis not present

## 2020-08-16 DIAGNOSIS — Z79899 Other long term (current) drug therapy: Secondary | ICD-10-CM | POA: Diagnosis not present

## 2020-08-16 DIAGNOSIS — I2510 Atherosclerotic heart disease of native coronary artery without angina pectoris: Secondary | ICD-10-CM | POA: Diagnosis not present

## 2020-08-16 DIAGNOSIS — E119 Type 2 diabetes mellitus without complications: Secondary | ICD-10-CM | POA: Diagnosis not present

## 2020-08-16 DIAGNOSIS — J45909 Unspecified asthma, uncomplicated: Secondary | ICD-10-CM | POA: Diagnosis not present

## 2020-08-16 DIAGNOSIS — N201 Calculus of ureter: Secondary | ICD-10-CM | POA: Diagnosis not present

## 2020-08-16 DIAGNOSIS — I4891 Unspecified atrial fibrillation: Secondary | ICD-10-CM | POA: Diagnosis not present

## 2020-08-16 DIAGNOSIS — K219 Gastro-esophageal reflux disease without esophagitis: Secondary | ICD-10-CM | POA: Diagnosis not present

## 2020-08-16 DIAGNOSIS — Z888 Allergy status to other drugs, medicaments and biological substances status: Secondary | ICD-10-CM | POA: Diagnosis not present

## 2020-08-16 DIAGNOSIS — Z955 Presence of coronary angioplasty implant and graft: Secondary | ICD-10-CM | POA: Diagnosis not present

## 2020-08-16 DIAGNOSIS — N200 Calculus of kidney: Secondary | ICD-10-CM | POA: Diagnosis not present

## 2020-08-16 DIAGNOSIS — E785 Hyperlipidemia, unspecified: Secondary | ICD-10-CM | POA: Diagnosis not present

## 2020-08-20 DIAGNOSIS — N200 Calculus of kidney: Secondary | ICD-10-CM | POA: Diagnosis not present

## 2020-08-20 DIAGNOSIS — Z20822 Contact with and (suspected) exposure to covid-19: Secondary | ICD-10-CM | POA: Diagnosis not present

## 2020-08-23 DIAGNOSIS — N200 Calculus of kidney: Secondary | ICD-10-CM | POA: Diagnosis not present

## 2020-08-23 DIAGNOSIS — K219 Gastro-esophageal reflux disease without esophagitis: Secondary | ICD-10-CM | POA: Diagnosis not present

## 2020-08-23 DIAGNOSIS — Z7902 Long term (current) use of antithrombotics/antiplatelets: Secondary | ICD-10-CM | POA: Diagnosis not present

## 2020-08-23 DIAGNOSIS — J45909 Unspecified asthma, uncomplicated: Secondary | ICD-10-CM | POA: Diagnosis not present

## 2020-08-23 DIAGNOSIS — Z7984 Long term (current) use of oral hypoglycemic drugs: Secondary | ICD-10-CM | POA: Diagnosis not present

## 2020-08-23 DIAGNOSIS — N401 Enlarged prostate with lower urinary tract symptoms: Secondary | ICD-10-CM | POA: Diagnosis not present

## 2020-08-23 DIAGNOSIS — Z955 Presence of coronary angioplasty implant and graft: Secondary | ICD-10-CM | POA: Diagnosis not present

## 2020-08-23 DIAGNOSIS — R3129 Other microscopic hematuria: Secondary | ICD-10-CM | POA: Diagnosis not present

## 2020-08-23 DIAGNOSIS — N138 Other obstructive and reflux uropathy: Secondary | ICD-10-CM | POA: Diagnosis not present

## 2020-08-23 DIAGNOSIS — I2510 Atherosclerotic heart disease of native coronary artery without angina pectoris: Secondary | ICD-10-CM | POA: Diagnosis not present

## 2020-08-23 DIAGNOSIS — I10 Essential (primary) hypertension: Secondary | ICD-10-CM | POA: Diagnosis not present

## 2020-08-23 DIAGNOSIS — E119 Type 2 diabetes mellitus without complications: Secondary | ICD-10-CM | POA: Diagnosis not present

## 2020-08-23 DIAGNOSIS — Z888 Allergy status to other drugs, medicaments and biological substances status: Secondary | ICD-10-CM | POA: Diagnosis not present

## 2020-08-23 DIAGNOSIS — I4891 Unspecified atrial fibrillation: Secondary | ICD-10-CM | POA: Diagnosis not present

## 2020-08-23 DIAGNOSIS — Z87891 Personal history of nicotine dependence: Secondary | ICD-10-CM | POA: Diagnosis not present

## 2020-08-23 DIAGNOSIS — Z466 Encounter for fitting and adjustment of urinary device: Secondary | ICD-10-CM | POA: Diagnosis not present

## 2020-08-23 DIAGNOSIS — Z7982 Long term (current) use of aspirin: Secondary | ICD-10-CM | POA: Diagnosis not present

## 2020-09-22 DIAGNOSIS — N200 Calculus of kidney: Secondary | ICD-10-CM | POA: Diagnosis not present

## 2020-10-07 DIAGNOSIS — H43392 Other vitreous opacities, left eye: Secondary | ICD-10-CM | POA: Diagnosis not present

## 2020-12-05 DIAGNOSIS — H43392 Other vitreous opacities, left eye: Secondary | ICD-10-CM | POA: Diagnosis not present

## 2021-01-02 DIAGNOSIS — M7541 Impingement syndrome of right shoulder: Secondary | ICD-10-CM | POA: Diagnosis not present

## 2021-01-02 DIAGNOSIS — R202 Paresthesia of skin: Secondary | ICD-10-CM | POA: Diagnosis not present

## 2021-01-02 DIAGNOSIS — R200 Anesthesia of skin: Secondary | ICD-10-CM | POA: Diagnosis not present

## 2021-01-02 DIAGNOSIS — M5412 Radiculopathy, cervical region: Secondary | ICD-10-CM | POA: Diagnosis not present

## 2021-01-03 ENCOUNTER — Other Ambulatory Visit: Payer: Self-pay

## 2021-01-03 ENCOUNTER — Telehealth: Payer: Self-pay | Admitting: Internal Medicine

## 2021-01-03 MED ORDER — METOPROLOL TARTRATE 50 MG PO TABS: 50.0000 mg | ORAL_TABLET | Freq: Two times a day (BID) | ORAL | 3 refills | Status: DC

## 2021-01-03 NOTE — Telephone Encounter (Signed)
   *  STAT* If patient is at the pharmacy, call can be transferred to refill team.   1. Which medications need to be refilled? (please list name of each medication and dose if known)   metoprolol tartrate (LOPRESSOR) 50 MG tablet    2. Which pharmacy/location (including street and city if local pharmacy) is medication to be sent to? [id number] - [place], [place] - [address]  3. Do they need a 30 day or 90 day supply? 90 days   Pt is out of medications, needs refill today

## 2021-02-06 ENCOUNTER — Telehealth: Payer: Self-pay | Admitting: Internal Medicine

## 2021-02-06 NOTE — Telephone Encounter (Signed)
Patient states his wife had kidney stones so they ran a test and accidentally found aortic atherosclerosis. He states her PCP office said she does not need to see a cardiologist, but he states that does not seem logical. He would like to know if based on this finding, whether she should see a cardiologist or not. Informed him I was unsure if the office could give this recommendation as she is not a patient. He states to call his wife's number: [phone].

## 2021-02-06 NOTE — Telephone Encounter (Signed)
Returned call to patient's wife who is calling about herself. Explained to her that I cannot give medical advice or recommendations as she is not a patient. She can contact our office to arrange a new patient appointment if interested.

## 2021-02-12 DIAGNOSIS — M4802 Spinal stenosis, cervical region: Secondary | ICD-10-CM | POA: Diagnosis not present

## 2021-02-12 DIAGNOSIS — M2578 Osteophyte, vertebrae: Secondary | ICD-10-CM | POA: Diagnosis not present

## 2021-02-12 DIAGNOSIS — M5011 Cervical disc disorder with radiculopathy,  high cervical region: Secondary | ICD-10-CM | POA: Diagnosis not present

## 2021-02-12 DIAGNOSIS — M4722 Other spondylosis with radiculopathy, cervical region: Secondary | ICD-10-CM | POA: Diagnosis not present

## 2021-02-12 DIAGNOSIS — M5412 Radiculopathy, cervical region: Secondary | ICD-10-CM | POA: Diagnosis not present

## 2021-02-19 DIAGNOSIS — M5030 Other cervical disc degeneration, unspecified cervical region: Secondary | ICD-10-CM | POA: Diagnosis not present

## 2021-02-19 DIAGNOSIS — M5000 Cervical disc disorder with myelopathy, unspecified cervical region: Secondary | ICD-10-CM | POA: Diagnosis not present

## 2021-02-19 DIAGNOSIS — R200 Anesthesia of skin: Secondary | ICD-10-CM | POA: Diagnosis not present

## 2021-02-20 ENCOUNTER — Telehealth: Payer: Self-pay

## 2021-02-20 NOTE — Telephone Encounter (Signed)
   [facility] Pre-operative Risk Assessment    Patient Name: Matthew Horton  DOB: 06/15/1966 MRN: [id number]  HEARTCARE STAFF:  - IMPORTANT!!!!!! Under Visit Info/Reason for Call, type in Other and utilize the format Clearance MM/DD/YY or Clearance TBD. Do not use dashes or single digits. - Please review there is not already an duplicate clearance open for this procedure. - If request is for dental extraction, please clarify the # of teeth to be extracted. - If the patient is currently at the dentist's office, call Pre-Op Callback Staff (MA/nurse) to input urgent request.  - If the patient is not currently in the dentist office, please route to the Pre-Op pool.  Request for surgical clearance:  What type of surgery is being performed? Cervical Epidural Steroid Injection   When is this surgery scheduled? TBD  What type of clearance is required (medical clearance vs. Pharmacy clearance to hold med vs. Both)? Both   Are there any medications that need to be held prior to surgery and how long? Plavix, 7 days prior   Practice name and name of physician performing surgery? [place] [facility], Matthew Horton  What is the office phone number? [phone]   7.   What is the office fax number? [phone]  8.   Anesthesia type (None, local, MAC, general) ?    Matthew Horton 02/20/2021, 10:33 AM  _________________________________________________________________   (provider comments below)

## 2021-02-20 NOTE — Telephone Encounter (Signed)
Dr. Rennis Golden Pt has a history of CAD and is due for a nuclear stress test scheduled for 04/03/21. Do you recommend he complete this prior to cervical ESI? Can he hold plavix for 7 days?

## 2021-02-20 NOTE — Telephone Encounter (Signed)
Would recommend nuclear stress test prior to clearance - would advise trying to move up his stress test sooner. 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 If stress test negative, then ok to hold Plavix for 7 days prior to procedure.  Dr. Rennis Golden

## 2021-02-25 NOTE — Telephone Encounter (Signed)
   Pt is calling back, he r/s his stress test next week, also would like to get an update from his clearance

## 2021-02-26 ENCOUNTER — Other Ambulatory Visit: Payer: Self-pay

## 2021-02-26 DIAGNOSIS — I25119 Atherosclerotic heart disease of native coronary artery with unspecified angina pectoris: Secondary | ICD-10-CM

## 2021-02-26 NOTE — Progress Notes (Signed)
Attestation order placed. 

## 2021-02-26 NOTE — Telephone Encounter (Signed)
/  Please let the patient know that Dr. Rennis Golden has recommended the stress test be completed before he can clear him for [facility] and for plavix hold.  Stress test now scheduled for 03/06/21.

## 2021-02-26 NOTE — Telephone Encounter (Signed)
Faxed to requesting surgeons office to make them aware. 

## 2021-02-26 NOTE — Telephone Encounter (Signed)
Patient aware and states understanding and appreciation for the call.

## 2021-03-04 ENCOUNTER — Telehealth: Payer: Self-pay | Admitting: Internal Medicine

## 2021-03-04 ENCOUNTER — Telehealth (HOSPITAL_COMMUNITY): Payer: Self-pay | Admitting: *Deleted

## 2021-03-04 NOTE — Telephone Encounter (Signed)
Pt is wanting to know if there is any medication he should stop prior to procedure please advise.

## 2021-03-04 NOTE — Telephone Encounter (Signed)
Called patient, advised that there was no medication to hold for the lexiscan that I was aware of.  Patient verbalized understanding.

## 2021-03-04 NOTE — Telephone Encounter (Signed)
Close encounter 

## 2021-03-06 ENCOUNTER — Other Ambulatory Visit: Payer: Self-pay

## 2021-03-06 ENCOUNTER — Ambulatory Visit (HOSPITAL_COMMUNITY)
Admission: RE | Admit: 2021-03-06 | Discharge: 2021-03-06 | Disposition: A | Discharge status: Home / Self Care | Payer: BC Managed Care – PPO | Source: Ambulatory Visit | Attending: Cardiology | Admitting: Cardiology

## 2021-03-06 DIAGNOSIS — Z0289 Encounter for other administrative examinations: Secondary | ICD-10-CM | POA: Insufficient documentation

## 2021-03-06 DIAGNOSIS — I25119 Atherosclerotic heart disease of native coronary artery with unspecified angina pectoris: Secondary | ICD-10-CM | POA: Insufficient documentation

## 2021-03-06 LAB — MYOCARDIAL PERFUSION IMAGING
LV dias vol: 143 mL (ref 62–150)
LV sys vol: 69 mL
Nuc Stress EF: 52 %
Peak HR: 86 {beats}/min
Rest HR: 75 {beats}/min
Rest Nuclear Isotope Dose: 10.9 mCi
SDS: 6
SRS: 3
SSS: 9
ST Depression (mm): 0 mm
Stress Nuclear Isotope Dose: 28 mCi
TID: 1.02

## 2021-03-06 MED ORDER — TECHNETIUM TC 99M TETROFOSMIN IV KIT
28.0000 | PACK | Freq: Once | INTRAVENOUS | Status: AC | PRN
  Administered 2021-03-06: 28 via INTRAVENOUS
  Filled 2021-03-06: qty 28

## 2021-03-06 MED ORDER — TECHNETIUM TC 99M TETROFOSMIN IV KIT
10.9000 | PACK | Freq: Once | INTRAVENOUS | Status: AC | PRN
  Administered 2021-03-06: 10.9 via INTRAVENOUS
  Filled 2021-03-06: qty 11

## 2021-03-06 MED ORDER — REGADENOSON 0.4 MG/5ML IV SOLN
0.4000 mg | Freq: Once | INTRAVENOUS | Status: AC
  Administered 2021-03-06: 0.4 mg via INTRAVENOUS

## 2021-03-10 NOTE — Telephone Encounter (Signed)
    Patient Name: Matthew Horton  DOB: 09-01-1965 MRN: [id number]  Primary Cardiologist: Chrystie Nose, MD  Chart reviewed as part of pre-operative protocol coverage. Dr. Rennis Golden had recommended patient have stress test prior to this procedure (was previously scheduled due to DOT purposes, not due to symptoms). His stress test was low risk therefore Dr. Rennis Golden feels OK to hold Plavix for 7 days prior to procedure. I confirmed with patient that he has not had any new cardiac symptoms. The patient was advised that if he develops new symptoms prior to surgery to contact our office to arrange for a follow-up visit, and he verbalized understanding.  I will route this recommendation to the requesting party via Epic fax function and remove from pre-op pool.  Please call with questions.  Laurann Montana, PA-C 03/10/2021, 8:16 AM

## 2021-03-11 ENCOUNTER — Telehealth: Payer: Self-pay | Admitting: Internal Medicine

## 2021-03-11 NOTE — Telephone Encounter (Signed)
Patient would like to confirm that his stress test results have been sent to [facility]. He states he was told they would be sent over, but he called them twice and they said they don't have them.

## 2021-03-11 NOTE — Telephone Encounter (Signed)
Returned call to patient who states that he was told that his pre op clearance was not faxed over to Dr. Jon Gills office. Advised patient that upon chart review it was faxed over yesterday morning at 8:20 AM by Ronie Spies PA-C. Advised patient that the Pre-Op clearance will be faxed again right now and to reach back out if there are any issues or concerns regarding it. Patient verbalized understanding.

## 2021-03-28 DIAGNOSIS — M4722 Other spondylosis with radiculopathy, cervical region: Secondary | ICD-10-CM | POA: Diagnosis not present

## 2021-03-28 DIAGNOSIS — M5030 Other cervical disc degeneration, unspecified cervical region: Secondary | ICD-10-CM | POA: Diagnosis not present

## 2021-03-28 DIAGNOSIS — M4802 Spinal stenosis, cervical region: Secondary | ICD-10-CM | POA: Diagnosis not present

## 2021-04-03 ENCOUNTER — Encounter (HOSPITAL_COMMUNITY): Payer: BC Managed Care – PPO

## 2021-04-05 DIAGNOSIS — M47812 Spondylosis without myelopathy or radiculopathy, cervical region: Secondary | ICD-10-CM | POA: Diagnosis not present

## 2021-04-05 DIAGNOSIS — I2510 Atherosclerotic heart disease of native coronary artery without angina pectoris: Secondary | ICD-10-CM | POA: Diagnosis not present

## 2021-04-05 DIAGNOSIS — Z7984 Long term (current) use of oral hypoglycemic drugs: Secondary | ICD-10-CM | POA: Diagnosis not present

## 2021-04-05 DIAGNOSIS — M542 Cervicalgia: Secondary | ICD-10-CM | POA: Diagnosis not present

## 2021-04-05 DIAGNOSIS — I10 Essential (primary) hypertension: Secondary | ICD-10-CM | POA: Diagnosis not present

## 2021-04-05 DIAGNOSIS — I4891 Unspecified atrial fibrillation: Secondary | ICD-10-CM | POA: Diagnosis not present

## 2021-04-05 DIAGNOSIS — Z79899 Other long term (current) drug therapy: Secondary | ICD-10-CM | POA: Diagnosis not present

## 2021-04-05 DIAGNOSIS — M50021 Cervical disc disorder at C4-C5 level with myelopathy: Secondary | ICD-10-CM | POA: Diagnosis not present

## 2021-04-05 DIAGNOSIS — Z7982 Long term (current) use of aspirin: Secondary | ICD-10-CM | POA: Diagnosis not present

## 2021-04-05 DIAGNOSIS — M5031 Other cervical disc degeneration,  high cervical region: Secondary | ICD-10-CM | POA: Diagnosis not present

## 2021-04-05 DIAGNOSIS — M4802 Spinal stenosis, cervical region: Secondary | ICD-10-CM | POA: Diagnosis not present

## 2021-04-05 DIAGNOSIS — M5184 Other intervertebral disc disorders, thoracic region: Secondary | ICD-10-CM | POA: Diagnosis not present

## 2021-04-05 DIAGNOSIS — E119 Type 2 diabetes mellitus without complications: Secondary | ICD-10-CM | POA: Diagnosis not present

## 2021-04-05 DIAGNOSIS — M47813 Spondylosis without myelopathy or radiculopathy, cervicothoracic region: Secondary | ICD-10-CM | POA: Diagnosis not present

## 2021-04-05 DIAGNOSIS — Z87891 Personal history of nicotine dependence: Secondary | ICD-10-CM | POA: Diagnosis not present

## 2021-04-05 DIAGNOSIS — Z955 Presence of coronary angioplasty implant and graft: Secondary | ICD-10-CM | POA: Diagnosis not present

## 2021-04-05 DIAGNOSIS — R200 Anesthesia of skin: Secondary | ICD-10-CM | POA: Diagnosis not present

## 2021-04-05 DIAGNOSIS — Z888 Allergy status to other drugs, medicaments and biological substances status: Secondary | ICD-10-CM | POA: Diagnosis not present

## 2021-04-05 DIAGNOSIS — M4712 Other spondylosis with myelopathy, cervical region: Secondary | ICD-10-CM | POA: Diagnosis not present

## 2021-04-05 DIAGNOSIS — Z7902 Long term (current) use of antithrombotics/antiplatelets: Secondary | ICD-10-CM | POA: Diagnosis not present

## 2021-04-05 DIAGNOSIS — M5002 Cervical disc disorder with myelopathy, mid-cervical region, unspecified level: Secondary | ICD-10-CM | POA: Diagnosis not present

## 2021-04-05 DIAGNOSIS — M549 Dorsalgia, unspecified: Secondary | ICD-10-CM | POA: Diagnosis not present

## 2021-04-06 DIAGNOSIS — G959 Disease of spinal cord, unspecified: Secondary | ICD-10-CM | POA: Diagnosis not present

## 2021-04-07 ENCOUNTER — Telehealth: Payer: Self-pay | Admitting: Internal Medicine

## 2021-04-07 ENCOUNTER — Encounter: Payer: Self-pay | Admitting: Internal Medicine

## 2021-04-07 NOTE — Telephone Encounter (Signed)
Returned call to patient who states he was calling in regard to having surgery on his back. Patient states that he was initially being scheduled for an emergency back surgery but it was then changed and is waiting on insurance approval but states that his spine surgeon wants him to have the surgery as soon as possible. Patient states he was just recently cleared by our pre op department for injections and held his plavix for 7 days and also had stress testing for clearance. Patient states that he would like to know if he can get clearance for this procedure or if he needed to start over to receive clearance for this surgery. Surgery date is not yet set. Advised patient I would forward message to Dr. Rennis Golden for him to review and advise. Patient verbalized understanding.

## 2021-04-07 NOTE — Telephone Encounter (Signed)
error 

## 2021-04-07 NOTE — Telephone Encounter (Signed)
Patient wants to speak to Dr. Rennis Golden  about an epidural shot he is going to have done this week.

## 2021-04-08 NOTE — Telephone Encounter (Signed)
He is cleared for that procedure as well - with the same hold parameters regarding Plavix 7 days prior to the procedure - no further testing is needed.  Dr Rexene Edison

## 2021-04-08 NOTE — Telephone Encounter (Signed)
Covering preop today. MD already weighed in on clearance decision as below, so separate preop APP input not needed at this time. Will route to callback to coordinate with Eileen Stanford (Dr. Blanchie Dessert nurse) whether this information needs to be called to a specific office from here. Please let preop team know if we can help in any other way. Gregrey Bloyd PA-C

## 2021-04-10 ENCOUNTER — Ambulatory Visit: Payer: BC Managed Care – PPO | Admitting: Physician Assistant

## 2021-04-10 MED ORDER — LISINOPRIL 10 MG PO TABS: 10.0000 mg | ORAL_TABLET | Freq: Every day | ORAL | 1 refills | Status: DC

## 2021-04-10 NOTE — Telephone Encounter (Signed)
Spoke with patient who reports he had a back injection a few weeks ago and is now scheduled for surgery on 11/9. This is with Dr. Tawni Pummel. Manson Passey with [facility]. Unsure of anesthesia type as no formal clearance request for this specific surgery has been received   Fax number: [phone]  Advised MD said he is cleared, can hold plavix for 7 days prior  Will send over this note to Dr. Manson Passey and CC: Dr. Rennis Golden as Lorain Childes

## 2021-04-10 NOTE — Addendum Note (Signed)
Addended by: Lindell Spar on: 04/10/2021 03:20 PM   Modules accepted: Orders

## 2021-04-15 DIAGNOSIS — G959 Disease of spinal cord, unspecified: Secondary | ICD-10-CM | POA: Diagnosis not present

## 2021-04-15 DIAGNOSIS — M4802 Spinal stenosis, cervical region: Secondary | ICD-10-CM | POA: Diagnosis not present

## 2021-04-16 DIAGNOSIS — Z01810 Encounter for preprocedural cardiovascular examination: Secondary | ICD-10-CM | POA: Diagnosis not present

## 2021-04-16 DIAGNOSIS — Z8679 Personal history of other diseases of the circulatory system: Secondary | ICD-10-CM | POA: Diagnosis not present

## 2021-04-16 DIAGNOSIS — E119 Type 2 diabetes mellitus without complications: Secondary | ICD-10-CM | POA: Diagnosis not present

## 2021-04-16 DIAGNOSIS — Z7902 Long term (current) use of antithrombotics/antiplatelets: Secondary | ICD-10-CM | POA: Diagnosis not present

## 2021-04-16 DIAGNOSIS — Z01818 Encounter for other preprocedural examination: Secondary | ICD-10-CM | POA: Diagnosis not present

## 2021-04-16 DIAGNOSIS — I10 Essential (primary) hypertension: Secondary | ICD-10-CM | POA: Diagnosis not present

## 2021-04-16 DIAGNOSIS — I2510 Atherosclerotic heart disease of native coronary artery without angina pectoris: Secondary | ICD-10-CM | POA: Diagnosis not present

## 2021-04-16 DIAGNOSIS — Z79899 Other long term (current) drug therapy: Secondary | ICD-10-CM | POA: Diagnosis not present

## 2021-04-16 DIAGNOSIS — G959 Disease of spinal cord, unspecified: Secondary | ICD-10-CM | POA: Diagnosis not present

## 2021-04-16 DIAGNOSIS — M542 Cervicalgia: Secondary | ICD-10-CM | POA: Diagnosis not present

## 2021-04-23 DIAGNOSIS — Z7984 Long term (current) use of oral hypoglycemic drugs: Secondary | ICD-10-CM | POA: Diagnosis not present

## 2021-04-23 DIAGNOSIS — M4712 Other spondylosis with myelopathy, cervical region: Secondary | ICD-10-CM | POA: Diagnosis not present

## 2021-04-23 DIAGNOSIS — M50021 Cervical disc disorder at C4-C5 level with myelopathy: Secondary | ICD-10-CM | POA: Diagnosis not present

## 2021-04-23 DIAGNOSIS — Z955 Presence of coronary angioplasty implant and graft: Secondary | ICD-10-CM | POA: Diagnosis not present

## 2021-04-23 DIAGNOSIS — G959 Disease of spinal cord, unspecified: Secondary | ICD-10-CM | POA: Diagnosis not present

## 2021-04-23 DIAGNOSIS — Z7982 Long term (current) use of aspirin: Secondary | ICD-10-CM | POA: Diagnosis not present

## 2021-04-23 DIAGNOSIS — K219 Gastro-esophageal reflux disease without esophagitis: Secondary | ICD-10-CM | POA: Diagnosis not present

## 2021-04-23 DIAGNOSIS — I10 Essential (primary) hypertension: Secondary | ICD-10-CM | POA: Diagnosis not present

## 2021-04-23 DIAGNOSIS — Z888 Allergy status to other drugs, medicaments and biological substances status: Secondary | ICD-10-CM | POA: Diagnosis not present

## 2021-04-23 DIAGNOSIS — E119 Type 2 diabetes mellitus without complications: Secondary | ICD-10-CM | POA: Diagnosis not present

## 2021-04-23 DIAGNOSIS — Z87891 Personal history of nicotine dependence: Secondary | ICD-10-CM | POA: Diagnosis not present

## 2021-04-23 DIAGNOSIS — I4891 Unspecified atrial fibrillation: Secondary | ICD-10-CM | POA: Diagnosis not present

## 2021-04-23 DIAGNOSIS — G992 Myelopathy in diseases classified elsewhere: Secondary | ICD-10-CM | POA: Diagnosis not present

## 2021-04-23 DIAGNOSIS — I2510 Atherosclerotic heart disease of native coronary artery without angina pectoris: Secondary | ICD-10-CM | POA: Diagnosis not present

## 2021-04-23 DIAGNOSIS — M4802 Spinal stenosis, cervical region: Secondary | ICD-10-CM | POA: Diagnosis not present

## 2021-04-23 DIAGNOSIS — Z7902 Long term (current) use of antithrombotics/antiplatelets: Secondary | ICD-10-CM | POA: Diagnosis not present

## 2021-04-23 DIAGNOSIS — Z79899 Other long term (current) drug therapy: Secondary | ICD-10-CM | POA: Diagnosis not present

## 2021-05-08 ENCOUNTER — Other Ambulatory Visit: Payer: Self-pay | Admitting: Internal Medicine

## 2021-05-19 ENCOUNTER — Other Ambulatory Visit: Payer: Self-pay | Admitting: Internal Medicine

## 2021-05-28 DIAGNOSIS — E119 Type 2 diabetes mellitus without complications: Secondary | ICD-10-CM | POA: Diagnosis not present

## 2021-06-08 ENCOUNTER — Other Ambulatory Visit: Payer: Self-pay | Admitting: Internal Medicine

## 2021-06-17 ENCOUNTER — Other Ambulatory Visit: Payer: Self-pay | Admitting: *Deleted

## 2021-06-17 MED ORDER — ATORVASTATIN CALCIUM 40 MG PO TABS: 40.0000 mg | ORAL_TABLET | Freq: Every day | ORAL | 0 refills | Status: DC

## 2021-06-23 DIAGNOSIS — M50323 Other cervical disc degeneration at C6-C7 level: Secondary | ICD-10-CM | POA: Diagnosis not present

## 2021-06-23 DIAGNOSIS — M50322 Other cervical disc degeneration at C5-C6 level: Secondary | ICD-10-CM | POA: Diagnosis not present

## 2021-06-23 DIAGNOSIS — M5031 Other cervical disc degeneration,  high cervical region: Secondary | ICD-10-CM | POA: Diagnosis not present

## 2021-06-23 DIAGNOSIS — Z09 Encounter for follow-up examination after completed treatment for conditions other than malignant neoplasm: Secondary | ICD-10-CM | POA: Diagnosis not present

## 2021-07-14 DIAGNOSIS — I10 Essential (primary) hypertension: Secondary | ICD-10-CM | POA: Diagnosis not present

## 2021-07-14 DIAGNOSIS — Z23 Encounter for immunization: Secondary | ICD-10-CM | POA: Diagnosis not present

## 2021-07-14 DIAGNOSIS — E1169 Type 2 diabetes mellitus with other specified complication: Secondary | ICD-10-CM | POA: Diagnosis not present

## 2021-07-14 DIAGNOSIS — Z6829 Body mass index (BMI) 29.0-29.9, adult: Secondary | ICD-10-CM | POA: Diagnosis not present

## 2021-07-14 DIAGNOSIS — Z0000 Encounter for general adult medical examination without abnormal findings: Secondary | ICD-10-CM | POA: Diagnosis not present

## 2021-07-14 DIAGNOSIS — E782 Mixed hyperlipidemia: Secondary | ICD-10-CM | POA: Diagnosis not present

## 2021-07-14 DIAGNOSIS — Z1331 Encounter for screening for depression: Secondary | ICD-10-CM | POA: Diagnosis not present

## 2021-07-14 DIAGNOSIS — I2510 Atherosclerotic heart disease of native coronary artery without angina pectoris: Secondary | ICD-10-CM | POA: Diagnosis not present

## 2021-07-18 ENCOUNTER — Other Ambulatory Visit: Payer: Self-pay | Admitting: Internal Medicine

## 2021-07-23 ENCOUNTER — Other Ambulatory Visit: Payer: Self-pay | Admitting: Internal Medicine

## 2021-08-02 ENCOUNTER — Other Ambulatory Visit: Payer: Self-pay | Admitting: Internal Medicine

## 2021-08-07 ENCOUNTER — Other Ambulatory Visit: Payer: Self-pay | Admitting: Internal Medicine

## 2021-08-14 DIAGNOSIS — Z981 Arthrodesis status: Secondary | ICD-10-CM | POA: Diagnosis not present

## 2021-08-14 DIAGNOSIS — M50322 Other cervical disc degeneration at C5-C6 level: Secondary | ICD-10-CM | POA: Diagnosis not present

## 2021-08-14 DIAGNOSIS — M50323 Other cervical disc degeneration at C6-C7 level: Secondary | ICD-10-CM | POA: Diagnosis not present

## 2021-08-14 DIAGNOSIS — M5031 Other cervical disc degeneration,  high cervical region: Secondary | ICD-10-CM | POA: Diagnosis not present

## 2021-08-15 ENCOUNTER — Other Ambulatory Visit: Payer: Self-pay | Admitting: Internal Medicine

## 2021-08-19 DIAGNOSIS — M5136 Other intervertebral disc degeneration, lumbar region: Secondary | ICD-10-CM | POA: Diagnosis not present

## 2021-08-19 DIAGNOSIS — M47817 Spondylosis without myelopathy or radiculopathy, lumbosacral region: Secondary | ICD-10-CM | POA: Diagnosis not present

## 2021-08-19 DIAGNOSIS — M419 Scoliosis, unspecified: Secondary | ICD-10-CM | POA: Diagnosis not present

## 2021-08-19 DIAGNOSIS — M545 Low back pain, unspecified: Secondary | ICD-10-CM | POA: Diagnosis not present

## 2021-08-19 DIAGNOSIS — Z981 Arthrodesis status: Secondary | ICD-10-CM | POA: Diagnosis not present

## 2021-09-03 DIAGNOSIS — M5136 Other intervertebral disc degeneration, lumbar region: Secondary | ICD-10-CM | POA: Diagnosis not present

## 2021-09-03 DIAGNOSIS — M47816 Spondylosis without myelopathy or radiculopathy, lumbar region: Secondary | ICD-10-CM | POA: Diagnosis not present

## 2021-09-18 DIAGNOSIS — M5136 Other intervertebral disc degeneration, lumbar region: Secondary | ICD-10-CM | POA: Diagnosis not present

## 2021-09-18 DIAGNOSIS — M47816 Spondylosis without myelopathy or radiculopathy, lumbar region: Secondary | ICD-10-CM | POA: Diagnosis not present

## 2021-10-09 ENCOUNTER — Other Ambulatory Visit: Payer: Self-pay | Admitting: Internal Medicine

## 2021-10-17 DIAGNOSIS — E782 Mixed hyperlipidemia: Secondary | ICD-10-CM | POA: Diagnosis not present

## 2021-10-17 DIAGNOSIS — E1169 Type 2 diabetes mellitus with other specified complication: Secondary | ICD-10-CM | POA: Diagnosis not present

## 2021-10-17 DIAGNOSIS — E119 Type 2 diabetes mellitus without complications: Secondary | ICD-10-CM | POA: Diagnosis not present

## 2021-10-17 DIAGNOSIS — E1159 Type 2 diabetes mellitus with other circulatory complications: Secondary | ICD-10-CM | POA: Diagnosis not present

## 2021-10-17 DIAGNOSIS — K219 Gastro-esophageal reflux disease without esophagitis: Secondary | ICD-10-CM | POA: Diagnosis not present

## 2021-10-25 ENCOUNTER — Other Ambulatory Visit: Payer: Self-pay | Admitting: Internal Medicine

## 2021-10-27 ENCOUNTER — Telehealth: Payer: Self-pay | Admitting: Internal Medicine

## 2021-10-27 MED ORDER — EZETIMIBE 10 MG PO TABS: 10.0000 mg | ORAL_TABLET | Freq: Every day | ORAL | 1 refills | Status: DC

## 2021-10-27 MED ORDER — ATORVASTATIN CALCIUM 40 MG PO TABS: 40.0000 mg | ORAL_TABLET | Freq: Every day | ORAL | 1 refills | Status: DC

## 2021-10-27 NOTE — Telephone Encounter (Signed)
?*  STAT* If patient is at the pharmacy, call can be transferred to refill team. ? ? ?1. Which medications need to be refilled? (please list name of each medication and dose if known) ezetimibe (ZETIA) 10 MG tablet ? ?atorvastatin (LIPITOR) 40 MG tablet ? ?2. Which pharmacy/location (including street and city if local pharmacy) is medication to be sent to? [id number] - [place], [place] - [address] ? ?3. Do they need a 30 day or 90 day supply? 90 ? ?Pt scheduled f/u appt on 04/23/22 @ 9 AM  ?

## 2021-10-27 NOTE — Telephone Encounter (Signed)
Refills sent to pharmacy. 

## 2022-01-08 ENCOUNTER — Other Ambulatory Visit: Payer: Self-pay | Admitting: Internal Medicine

## 2022-01-12 ENCOUNTER — Other Ambulatory Visit: Payer: Self-pay | Admitting: Internal Medicine

## 2022-02-09 ENCOUNTER — Other Ambulatory Visit: Payer: Self-pay | Admitting: Internal Medicine

## 2022-02-25 ENCOUNTER — Other Ambulatory Visit: Payer: Self-pay | Admitting: Internal Medicine

## 2022-03-16 ENCOUNTER — Other Ambulatory Visit: Payer: Self-pay

## 2022-03-16 ENCOUNTER — Encounter: Payer: Self-pay | Admitting: Physician Assistant

## 2022-03-16 ENCOUNTER — Ambulatory Visit: Payer: BC Managed Care – PPO | Attending: Physician Assistant | Admitting: Physician Assistant

## 2022-03-16 VITALS — BP 126/72 | HR 91 | Ht 70.0 in | Wt 191.0 lb

## 2022-03-16 DIAGNOSIS — E785 Hyperlipidemia, unspecified: Secondary | ICD-10-CM | POA: Diagnosis not present

## 2022-03-16 DIAGNOSIS — E119 Type 2 diabetes mellitus without complications: Secondary | ICD-10-CM | POA: Diagnosis not present

## 2022-03-16 DIAGNOSIS — I2510 Atherosclerotic heart disease of native coronary artery without angina pectoris: Secondary | ICD-10-CM | POA: Diagnosis not present

## 2022-03-16 DIAGNOSIS — I10 Essential (primary) hypertension: Secondary | ICD-10-CM

## 2022-03-16 MED ORDER — PANTOPRAZOLE SODIUM 40 MG PO TBEC: 40.0000 mg | DELAYED_RELEASE_TABLET | Freq: Every day | ORAL | 3 refills | Status: DC

## 2022-03-16 MED ORDER — ATORVASTATIN CALCIUM 40 MG PO TABS: 40.0000 mg | ORAL_TABLET | Freq: Every day | ORAL | 3 refills | Status: DC

## 2022-03-16 MED ORDER — EZETIMIBE 10 MG PO TABS: 10.0000 mg | ORAL_TABLET | Freq: Every day | ORAL | 3 refills | Status: DC

## 2022-03-16 MED ORDER — METOPROLOL TARTRATE 50 MG PO TABS: 50.0000 mg | ORAL_TABLET | Freq: Two times a day (BID) | ORAL | 3 refills | Status: DC

## 2022-03-16 MED ORDER — LISINOPRIL 10 MG PO TABS: 10.0000 mg | ORAL_TABLET | Freq: Every day | ORAL | 3 refills | Status: DC

## 2022-03-16 MED ORDER — CLOPIDOGREL BISULFATE 75 MG PO TABS: 75.0000 mg | ORAL_TABLET | Freq: Every day | ORAL | 3 refills | Status: DC

## 2022-03-16 MED ORDER — AMLODIPINE BESYLATE 5 MG PO TABS: ORAL_TABLET | ORAL | 0 refills | Status: DC

## 2022-03-16 MED ORDER — NITROGLYCERIN 0.4 MG SL SUBL: 0.4000 mg | SUBLINGUAL_TABLET | SUBLINGUAL | 2 refills | Status: DC | PRN

## 2022-03-16 NOTE — Patient Instructions (Signed)
Medication Instructions:  Your physician recommends that you continue on your current medications as directed. Please refer to the Current Medication list given to you today.  *If you need a refill on your cardiac medications before your next appointment, please call your pharmacy*  Lab Work: NONE ordered at this time of appointment   If you have labs (blood work) drawn today and your tests are completely normal, you will receive your results only by: MyChart Message (if you have MyChart) OR A paper copy in the mail If you have any lab test that is abnormal or we need to change your treatment, we will call you to review the results.  Testing/Procedures: NONE ordered at this time of appointment   Follow-Up: At [facility], you and your health needs are our priority.  As part of our continuing mission to provide you with exceptional heart care, we have created designated Provider Care Teams.  These Care Teams include your primary Cardiologist (physician) and Advanced Practice Providers (APPs -  Physician Assistants and Nurse Practitioners) who all work together to provide you with the care you need, when you need it.  Your next appointment:   1 year(s)  The format for your next appointment:   In Person  Provider:   Kenneth C Hilty, MD     Other Instructions   Important Information About Sugar       

## 2022-03-16 NOTE — Progress Notes (Unsigned)
Cardiology Office Note:    Date:  03/18/2022   ID:  Matthew Horton, DOB July 11, 1965, MRN [id number]  PCP:  [name], MD    HeartCare Providers Cardiologist:  Chrystie Nose, MD     Referring MD: [name], MD   Chief Complaint  Patient presents with   Follow-up    Seen for Dr. Rennis Golden    History of Present Illness:    Matthew Horton is a 56 y.o. male with a hx of moderate nonobstructive CAD on cath 08/2012, hypertension, hyperlipidemia, DM2, obstructive sleep apnea and longstanding tobacco abuse.  Patient had abnormal stress test in 2014, subsequent cardiac catheterization in March 2014 revealed moderate two-vessel CAD in the diagonal branch of LAD and RCA, there was also ostial OM1 lesion.  FFR of RCA 70% lesion was nonsignificant.  Medical therapy was recommended.  Due to recurrent symptom, patient underwent cardiac catheterization October 2014 again and found to have severe two-vessel disease in mid RCA and mid PDA demonstrating significant progression of disease, both were treated with drug-eluting stent.  Postprocedure, he had A-fib with RVR in the Cath Lab.  He quickly converted back to sinus rhythm after moving to the floor.  Patient underwent a Myoview at [facility] on 08/14/2014 that showed inducible ischemia in the inferior lateral and lateral apical segment, peri-infarct ischemia in the inferior wall and the basal inferoseptum, normal EF.  He subsequently underwent cardiac catheterization on 08/21/2014 at [facility].  This led to a stress echo which came back mildly abnormal.  He underwent repeat cardiac catheterization which was negative for new obstructive disease.  Myoview in 2020 was negative.  Repeat echocardiogram at the time showed low normal EF of 50 to 55%.  Patient was last seen by Dr. Rennis Golden in October 2021 at which time he continued to require DOT driving license.  He had a repeat Myoview on 03/06/2021 which came back low risk, no evidence of ischemia, EF 52%.  He went  neck fusion surgery in November 2022.  Patient presents today for follow-up.  He has chest pain with more strenuous activity but not with every day activity.  He has dyspnea on exertion that is unchanged when compared to last year.  His functional ability is poor.  He has no significant lower extremity edema, orthopnea or PND.  We recommend continue on the current therapy.  Last lipid panel obtained in January 2023 showed a very well-controlled cholesterol, low HDL.    Past Medical History:  Diagnosis Date   Atrial fibrillation (HCC)    a. Transient during adm 03/2013 for PCI.    CKD (chronic kidney disease) stage 2, GFR 60-89 ml/min    Cluster headache    Coronary artery disease    a. s/p DES x 2 to RCA 03/2013 - residual disease for med rx.   DM2 (diabetes mellitus, type 2) (HCC)    HLD (hyperlipidemia)    HTN (hypertension)    Low testosterone    Nonallopathic lesion of cervical region    OSA (obstructive sleep apnea)    Tobacco abuse    Torticollis, acute     Past Surgical History:  Procedure Laterality Date   CARDIAC CATHETERIZATION  08/08/2013   CORONARY ANGIOPLASTY WITH STENT PLACEMENT  03/22/2013   "2 stents", Dr. Ranae Palms   FRACTIONAL FLOW RESERVE WIRE Right 03/22/2013   Procedure: FRACTIONAL FLOW RESERVE WIRE;  Surgeon: Marykay Lex, MD;  Location: [facility] CATH LAB;  Service: Cardiovascular;  Laterality: Right;  LEFT HEART CATHETERIZATION WITH CORONARY ANGIOGRAM N/A 08/16/2012   Procedure: LEFT HEART CATHETERIZATION WITH CORONARY ANGIOGRAM;  Surgeon: Lorretta Harp, MD;  Location: [facility] CATH LAB;  Service: Cardiovascular;  Laterality: N/A;   LEFT HEART CATHETERIZATION WITH CORONARY ANGIOGRAM N/A 03/22/2013   Procedure: LEFT HEART CATHETERIZATION WITH CORONARY ANGIOGRAM;  Surgeon: Leonie Man, MD;  Location: [facility] CATH LAB;  Service: Cardiovascular;  Laterality: N/A;   LEFT HEART CATHETERIZATION WITH CORONARY ANGIOGRAM N/A 08/08/2013   Procedure: LEFT HEART CATHETERIZATION WITH  CORONARY ANGIOGRAM;  Surgeon: Troy Sine, MD;  Location[facility] LAB;  Service: Cardiovascular;  Laterality: N/A;   PERCUTANEOUS CORONARY STENT INTERVENTION (PCI-S) Right 03/22/2013   Procedure: PERCUTANEOUS CORONARY STENT INTERVENTION (PCI-S);  Surgeon: Leonie Man, MD;  Location: [facility] CATH LAB;  Service: Cardiovascular;  Laterality: Right;   TONSILLECTOMY     TRAUMATIC AMPUTATION AND REATTACHMENT Left    Index finger    Current Medications: Current Meds  Medication Sig   acetaminophen (TYLENOL) 325 MG tablet Take 1-2 tablets (325-650 mg total) by mouth every 4 (four) hours as needed for mild pain or moderate pain.   albuterol (PROAIR HFA) 108 (90 Base) MCG/ACT inhaler Inhale 1-2 puffs into the lungs every 6 (six) hours as needed for wheezing or shortness of breath.   amLODipine (NORVASC) 5 MG tablet TAKE 1 TABLET DAILY. PATIENT MUST SCHEDULE ANNUAL VISIT FOR FUTURE REFILLS. FIRST ATTEMPT   aspirin EC 81 MG tablet Take 81 mg by mouth every morning.    atorvastatin (LIPITOR) 40 MG tablet Take 1 tablet (40 mg total) by mouth daily at 6 PM.   clopidogrel (PLAVIX) 75 MG tablet Take 1 tablet (75 mg total) by mouth daily.   ezetimibe (ZETIA) 10 MG tablet Take 1 tablet (10 mg total) by mouth daily.   lisinopril (ZESTRIL) 10 MG tablet Take 1 tablet (10 mg total) by mouth daily.   metoprolol tartrate (LOPRESSOR) 50 MG tablet Take 1 tablet (50 mg total) by mouth 2 (two) times daily.   nitroGLYCERIN (NITROSTAT) 0.4 MG SL tablet Place 1 tablet (0.4 mg total) under the tongue every 5 (five) minutes as needed for chest pain.   pantoprazole (PROTONIX) 40 MG tablet Take 1 tablet (40 mg total) by mouth daily.   SILDENAFIL CITRATE PO Take 30 mg by mouth as needed.   SitaGLIPtin-MetFORMIN HCl (JANUMET XR) 50-1000 MG TB24 Take 1 tablet by mouth daily.   [DISCONTINUED] amLODipine (NORVASC) 5 MG tablet TAKE 1 TABLET DAILY. PATIENT MUST SCHEDULE ANNUAL VISIT FOR FUTURE REFILLS. FIRST ATTEMPT   [DISCONTINUED]  atorvastatin (LIPITOR) 40 MG tablet TAKE 1 TABLET (40 MG TOTAL) BY MOUTH DAILY AT 6 PM.   [DISCONTINUED] atorvastatin (LIPITOR) 40 MG tablet Take 1 tablet (40 mg total) by mouth daily.   [DISCONTINUED] clopidogrel (PLAVIX) 75 MG tablet TAKE 1 TABLET BY MOUTH EVERY DAY   [DISCONTINUED] ezetimibe (ZETIA) 10 MG tablet Take 1 tablet (10 mg total) by mouth daily. KEEP UPCOMING APPOINTMENT FOR FUTURE REFILLS.   [DISCONTINUED] lisinopril (ZESTRIL) 10 MG tablet Take 1 tablet (10 mg total) by mouth daily. SCHEDULE OFFICE VISIT FOR FUTURE REFILLS.   [DISCONTINUED] metoprolol tartrate (LOPRESSOR) 50 MG tablet TAKE 1 TABLET BY MOUTH TWICE A DAY   [DISCONTINUED] nitroGLYCERIN (NITROSTAT) 0.4 MG SL tablet Place 1 tablet (0.4 mg total) under the tongue every 5 (five) minutes as needed for chest pain.   [DISCONTINUED] pantoprazole (PROTONIX) 40 MG tablet Take 1 tablet (40 mg total) by mouth daily.  Allergies:   Ranexa [ranolazine]   Social History   Socioeconomic History   Marital status: Married    Spouse name: Not on file   Number of children: Not on file   Years of education: Not on file   Highest education level: Not on file  Occupational History   Not on file  Tobacco Use   Smoking status: Former    Packs/day: 1.00    Years: 30.00    Total pack years: 30.00    Types: Cigarettes    Quit date: 07/16/2013    Years since quitting: 8.6   Smokeless tobacco: Never  Vaping Use   Vaping Use: Never used  Substance and Sexual Activity   Alcohol use: No   Drug use: No   Sexual activity: Not on file  Other Topics Concern   Not on file  Social History Narrative   Drive a truck for a living.   Social Determinants of Health   Financial Resource Strain: Not on file  Food Insecurity: Not on file  Transportation Needs: Not on file  Physical Activity: Not on file  Stress: Not on file  Social Connections: Not on file     Family History: The patient's family history includes Coronary artery  disease (age of onset: 48) in his father; Diabetes in his father; Hyperlipidemia in his father and mother; Hypertension in his father.  ROS:   Please see the history of present illness.     All other systems reviewed and are negative.  EKGs/Labs/Other Studies Reviewed:    The following studies were reviewed today:  Myoview 03/06/2021   The study is normal. The study is low risk.   No ST deviation was noted.   LV perfusion is normal. There is no evidence of ischemia. There is no evidence of infarction.   Left ventricular function is abnormal. Global function is mildly reduced. Nuclear stress EF: 52 %. The left ventricular ejection fraction is mildly decreased (45-54%). End diastolic cavity size is normal.   Prior study available for comparison from 04/11/2019.   Low risk stress nuclear study with normal perfusion and borderline reduced left ventricular global systolic function. No change from previous study (EF was 48%).  EKG:  EKG is ordered today.  The ekg ordered today demonstrates normal sinus rhythm, incomplete right bundle branch block.  Recent Labs: No results found for requested labs within last 365 days.  Recent Lipid Panel    Component Value Date/Time   CHOL 166 03/30/2019 0859   TRIG 125 03/30/2019 0859   HDL 35 (L) 03/30/2019 0859   CHOLHDL 4.7 03/30/2019 0859   CHOLHDL 4.6 08/08/2013 0246   VLDL 26 08/08/2013 0246   LDLCALC 108 (H) 03/30/2019 0859     Risk Assessment/Calculations:           Physical Exam:    VS:  BP 126/72   Pulse 91   Ht 5\' 10"  (1.778 m)   Wt 191 lb (86.6 kg)   SpO2 95%   BMI 27.41 kg/m        Wt Readings from Last 3 Encounters:  03/16/22 191 lb (86.6 kg)  03/06/21 199 lb (90.3 kg)  03/29/20 199 lb (90.3 kg)     GEN:  Well nourished, well developed in no acute distress HEENT: Normal NECK: No JVD; No carotid bruits LYMPHATICS: No lymphadenopathy CARDIAC: RRR, no murmurs, rubs, gallops RESPIRATORY:  Clear to auscultation  without rales, wheezing or rhonchi  ABDOMEN: Soft, non-tender, non-distended MUSCULOSKELETAL:  No edema;  No deformity  SKIN: Warm and dry NEUROLOGIC:  Alert and oriented x 3 PSYCHIATRIC:  Normal affect   ASSESSMENT:    1. Coronary artery disease involving native coronary artery of native heart without angina pectoris   2. Essential hypertension   3. Hyperlipidemia LDL goal <70   4. Controlled type 2 diabetes mellitus without complication, without long-term current use of insulin (HCC)    PLAN:    In order of problems listed above:  CAD: Patient continues to have chest discomfort with more strenuous activity but not with every day activity.  This has not changed since the last visit.  We will continue observation.  Hypertension: Blood pressure stable  Hyperlipidemia: On Lipitor and Zetia  DM2: Managed by primary care provider.           Medication Adjustments/Labs and Tests Ordered: Current medicines are reviewed at length with the patient today.  Concerns regarding medicines are outlined above.  Orders Placed This Encounter  Procedures   EKG 12-Lead   No orders of the defined types were placed in this encounter.   Patient Instructions  Medication Instructions:  Your physician recommends that you continue on your current medications as directed. Please refer to the Current Medication list given to you today.  *If you need a refill on your cardiac medications before your next appointment, please call your pharmacy*  Lab Work: NONE ordered at this time of appointment   If you have labs (blood work) drawn today and your tests are completely normal, you will receive your results only by: MyChart Message (if you have MyChart) OR A paper copy in the mail If you have any lab test that is abnormal or we need to change your treatment, we will call you to review the results.  Testing/Procedures: NONE ordered at this time of appointment   Follow-Up: At [facility], you and your health needs are our priority.  As part of our continuing mission to provide you with exceptional heart care, we have created designated Provider Care Teams.  These Care Teams include your primary Cardiologist (physician) and Advanced Practice Providers (APPs -  Physician Assistants and Nurse Practitioners) who all work together to provide you with the care you need, when you need it.  Your next appointment:   1 year(s)  The format for your next appointment:   In Person  Provider:   Chrystie Nose, MD     Other Instructions   Important Information About Sugar         Ramond Dial, [place]  03/18/2022 12:25 AM     [facility]

## 2022-03-18 ENCOUNTER — Encounter: Payer: Self-pay | Admitting: Physician Assistant

## 2022-04-01 DIAGNOSIS — R197 Diarrhea, unspecified: Secondary | ICD-10-CM | POA: Diagnosis not present

## 2022-04-02 DIAGNOSIS — R197 Diarrhea, unspecified: Secondary | ICD-10-CM | POA: Diagnosis not present

## 2022-04-08 ENCOUNTER — Other Ambulatory Visit: Payer: Self-pay | Admitting: Internal Medicine

## 2022-04-13 DIAGNOSIS — Z7982 Long term (current) use of aspirin: Secondary | ICD-10-CM | POA: Diagnosis not present

## 2022-04-13 DIAGNOSIS — R202 Paresthesia of skin: Secondary | ICD-10-CM | POA: Diagnosis not present

## 2022-04-13 DIAGNOSIS — M5137 Other intervertebral disc degeneration, lumbosacral region: Secondary | ICD-10-CM | POA: Diagnosis not present

## 2022-04-13 DIAGNOSIS — I10 Essential (primary) hypertension: Secondary | ICD-10-CM | POA: Diagnosis not present

## 2022-04-13 DIAGNOSIS — Z79899 Other long term (current) drug therapy: Secondary | ICD-10-CM | POA: Diagnosis not present

## 2022-04-13 DIAGNOSIS — R531 Weakness: Secondary | ICD-10-CM | POA: Diagnosis not present

## 2022-04-13 DIAGNOSIS — M5136 Other intervertebral disc degeneration, lumbar region: Secondary | ICD-10-CM | POA: Diagnosis not present

## 2022-04-13 DIAGNOSIS — M47817 Spondylosis without myelopathy or radiculopathy, lumbosacral region: Secondary | ICD-10-CM | POA: Diagnosis not present

## 2022-04-13 DIAGNOSIS — M545 Low back pain, unspecified: Secondary | ICD-10-CM | POA: Diagnosis not present

## 2022-04-13 DIAGNOSIS — Z87891 Personal history of nicotine dependence: Secondary | ICD-10-CM | POA: Diagnosis not present

## 2022-04-13 DIAGNOSIS — Z7902 Long term (current) use of antithrombotics/antiplatelets: Secondary | ICD-10-CM | POA: Diagnosis not present

## 2022-04-13 DIAGNOSIS — I2510 Atherosclerotic heart disease of native coronary artery without angina pectoris: Secondary | ICD-10-CM | POA: Diagnosis not present

## 2022-04-13 DIAGNOSIS — Z888 Allergy status to other drugs, medicaments and biological substances status: Secondary | ICD-10-CM | POA: Diagnosis not present

## 2022-04-13 DIAGNOSIS — E119 Type 2 diabetes mellitus without complications: Secondary | ICD-10-CM | POA: Diagnosis not present

## 2022-04-13 DIAGNOSIS — K219 Gastro-esophageal reflux disease without esophagitis: Secondary | ICD-10-CM | POA: Diagnosis not present

## 2022-04-13 DIAGNOSIS — M47816 Spondylosis without myelopathy or radiculopathy, lumbar region: Secondary | ICD-10-CM | POA: Diagnosis not present

## 2022-04-13 DIAGNOSIS — Z7984 Long term (current) use of oral hypoglycemic drugs: Secondary | ICD-10-CM | POA: Diagnosis not present

## 2022-04-15 DIAGNOSIS — M25551 Pain in right hip: Secondary | ICD-10-CM | POA: Diagnosis not present

## 2022-04-15 DIAGNOSIS — M549 Dorsalgia, unspecified: Secondary | ICD-10-CM | POA: Diagnosis not present

## 2022-04-15 DIAGNOSIS — M48061 Spinal stenosis, lumbar region without neurogenic claudication: Secondary | ICD-10-CM | POA: Diagnosis not present

## 2022-04-17 ENCOUNTER — Telehealth: Payer: Self-pay

## 2022-04-17 NOTE — Patient Outreach (Signed)
  Care Coordination   04/17/2022 Name: Matthew Horton MRN: [id number] DOB: 10/12/1965   Care Coordination Outreach Attempts:  An unsuccessful telephone outreach was attempted today to offer the patient information about available care coordination services as a benefit of their health plan.   Follow Up Plan:  Additional outreach attempts will be made to offer the patient care coordination information and services.   Encounter Outcome:  No Answer  Care Coordination Interventions Activated:  No   Care Coordination Interventions:  No, not indicated    Tomasa Rand, RN, BSN, CEN [facility] [employer] [phone]

## 2022-04-20 ENCOUNTER — Telehealth: Payer: Self-pay

## 2022-04-20 NOTE — Patient Outreach (Signed)
  Care Coordination   04/20/2022 Name: Matthew Horton MRN: [id number] DOB: [birth year]   Care Coordination Outreach Attempts:  A second unsuccessful outreach was attempted today to offer the patient with information about available care coordination services as a benefit of their health plan.     Follow Up Plan:  Additional outreach attempts will be made to offer the patient care coordination information and services.   Encounter Outcome:  No Answer  Care Coordination Interventions Activated:  No   Care Coordination Interventions:  No, not indicated    Tomasa Rand, RN, BSN, CEN [facility] [employer] [phone]

## 2022-04-23 ENCOUNTER — Telehealth: Payer: Self-pay | Admitting: *Deleted

## 2022-04-23 ENCOUNTER — Ambulatory Visit: Payer: BC Managed Care – PPO | Admitting: Internal Medicine

## 2022-04-23 NOTE — Telephone Encounter (Signed)
   Pre-operative Risk Assessment    Patient Name: Matthew Horton  DOB: 1965-08-11 MRN: [id number]     REQUEST IS ASKING FOR 3 INJECTIONS IN THE TREATMENT PLAN  Request for Surgical Clearance    Procedure:   B/L L4-5, TRANSFORAMINAL ESI ; Date of Surgery:  Clearance 05/12/22                                 Surgeon:  DR. Tacey Ruiz Surgeon's Group or Practice Name:  [facility] Phone number:  [phone] Fax number:  [phone]   Type of Clearance Requested:   - Medical  - Pharmacy:  Hold Clopidogrel (Plavix) x 7 DAYS PRIOR AND RESUME 24 HOURS POST OP   Type of Anesthesia:  Local    Additional requests/questions:    Matthew Horton   04/23/2022, 6:01 PM

## 2022-04-24 NOTE — Telephone Encounter (Signed)
   Primary Cardiologist: Chrystie Nose, MD  Chart reviewed as part of pre-operative protocol coverage. Given past medical history and time since last visit, based on ACC/AHA guidelines, HOBIE KOHLES would be at acceptable risk for the planned procedure without further cardiovascular testing.   Patient may hold Plavix for 7 days prior to [facility] and should resume as soon as hemodynamically stable following the procedure.   I will route this recommendation to the requesting party via Epic fax function and remove from pre-op pool.  Please call with questions.  Levi Aland, NP-C  04/24/2022, 6:57 AM [address] Office [phone] Fax [phone]

## 2022-04-25 ENCOUNTER — Other Ambulatory Visit: Payer: Self-pay | Admitting: Internal Medicine

## 2022-04-27 ENCOUNTER — Other Ambulatory Visit: Payer: Self-pay | Admitting: Internal Medicine

## 2022-04-28 NOTE — Telephone Encounter (Signed)
Stating they havent rcvd the fax, asking that our office resend fax. Please advise

## 2022-05-04 ENCOUNTER — Telehealth: Payer: Self-pay | Admitting: Internal Medicine

## 2022-05-04 MED ORDER — AMLODIPINE BESYLATE 5 MG PO TABS: ORAL_TABLET | ORAL | 3 refills | Status: DC

## 2022-05-04 NOTE — Telephone Encounter (Signed)
*  STAT* If patient is at the pharmacy, call can be transferred to refill team.   1. Which medications need to be refilled? (please list name of each medication and dose if known)   AmLODipine (NORVASC) 5 MG tablet   2. Which pharmacy/location (including street and city if local pharmacy) is medication to be sent to?  [id number] [place], [place] - [address]   3. Do they need a 30 day or 90 day supply?   90 day  Patient stated he is completely out of this medication.

## 2022-05-12 DIAGNOSIS — E139 Other specified diabetes mellitus without complications: Secondary | ICD-10-CM | POA: Diagnosis not present

## 2022-05-12 DIAGNOSIS — Z981 Arthrodesis status: Secondary | ICD-10-CM | POA: Diagnosis not present

## 2022-05-12 DIAGNOSIS — M48062 Spinal stenosis, lumbar region with neurogenic claudication: Secondary | ICD-10-CM | POA: Diagnosis not present

## 2022-05-12 DIAGNOSIS — M5416 Radiculopathy, lumbar region: Secondary | ICD-10-CM | POA: Diagnosis not present

## 2022-05-12 DIAGNOSIS — M5136 Other intervertebral disc degeneration, lumbar region: Secondary | ICD-10-CM | POA: Diagnosis not present

## 2022-05-13 ENCOUNTER — Telehealth: Payer: Self-pay

## 2022-05-13 NOTE — Patient Outreach (Signed)
  Care Coordination   Initial Visit Note   05/13/2022 Name: KERIC ZEHREN MRN: [id number] DOB: 1966-06-08  PHARRELL LEDFORD is a 56 y.o. year old male who sees [name], MD for primary care. I spoke with  [name] by phone today.  What matters to the patients health and wellness today?  Placed call to patient to review and offer [facility] care coordination . Patient reports that he is doing well. Reports he recent had a steroid injection and knows that his CBG is up; but states that he can manage it.  Denies any needs at this time. Encouraged patient to call me back or talk to his MD if he changes his mind.    SDOH assessments and interventions completed:  No     Care Coordination Interventions:  No, not indicated   Follow up plan: No further intervention required.   Encounter Outcome:  Pt. Refused   Rowe Pavy, RN, BSN, CEN [facility] [employer] [phone]

## 2022-05-22 ENCOUNTER — Other Ambulatory Visit: Payer: Self-pay

## 2022-05-22 MED ORDER — JANUMET XR 50-1000 MG PO TB24: 2.0000 | ORAL_TABLET | Freq: Every day | ORAL | 2 refills | Status: DC

## 2022-05-27 DIAGNOSIS — M48061 Spinal stenosis, lumbar region without neurogenic claudication: Secondary | ICD-10-CM | POA: Diagnosis not present

## 2022-05-27 DIAGNOSIS — M5441 Lumbago with sciatica, right side: Secondary | ICD-10-CM | POA: Diagnosis not present

## 2022-05-27 DIAGNOSIS — M5136 Other intervertebral disc degeneration, lumbar region: Secondary | ICD-10-CM | POA: Diagnosis not present

## 2022-05-27 DIAGNOSIS — M5416 Radiculopathy, lumbar region: Secondary | ICD-10-CM | POA: Diagnosis not present

## 2022-05-28 DIAGNOSIS — M25511 Pain in right shoulder: Secondary | ICD-10-CM | POA: Diagnosis not present

## 2022-05-28 DIAGNOSIS — M48061 Spinal stenosis, lumbar region without neurogenic claudication: Secondary | ICD-10-CM | POA: Diagnosis not present

## 2022-05-28 DIAGNOSIS — M47816 Spondylosis without myelopathy or radiculopathy, lumbar region: Secondary | ICD-10-CM | POA: Diagnosis not present

## 2022-05-28 DIAGNOSIS — M25551 Pain in right hip: Secondary | ICD-10-CM | POA: Diagnosis not present

## 2022-06-03 ENCOUNTER — Ambulatory Visit: Payer: BC Managed Care – PPO | Admitting: Internal Medicine

## 2022-06-03 ENCOUNTER — Encounter: Payer: Self-pay | Admitting: Internal Medicine

## 2022-06-03 VITALS — BP 118/70 | HR 86 | Resp 16 | Ht 70.0 in | Wt 198.4 lb

## 2022-06-03 DIAGNOSIS — J069 Acute upper respiratory infection, unspecified: Secondary | ICD-10-CM | POA: Diagnosis not present

## 2022-06-03 MED ORDER — ALBUTEROL SULFATE HFA 108 (90 BASE) MCG/ACT IN AERS: 2.0000 | INHALATION_SPRAY | Freq: Once | RESPIRATORY_TRACT | Status: DC

## 2022-06-03 MED ORDER — ALBUTEROL SULFATE HFA 108 (90 BASE) MCG/ACT IN AERS: 2.0000 | INHALATION_SPRAY | Freq: Four times a day (QID) | RESPIRATORY_TRACT | 2 refills | Status: DC | PRN

## 2022-06-03 MED ORDER — FLUTICASONE PROPIONATE 50 MCG/ACT NA SUSP: 1.0000 | Freq: Three times a day (TID) | NASAL | 1 refills | Status: AC | PRN

## 2022-06-03 NOTE — Addendum Note (Signed)
Addended by: Crist Fat on: 06/03/2022 11:33 AM   Modules accepted: Orders

## 2022-06-03 NOTE — Assessment & Plan Note (Signed)
He has clear post nasal drip.  This is most likely viral in nature.  We did a rapid COVID-19 antigen test and influenza testing and these were negative.  I asked him to do supportive care and I would like him to start on some flonase.

## 2022-06-03 NOTE — Progress Notes (Signed)
Office Visit  Subjective   Patient ID: ALEXANDRA POSADAS   DOB: 07/14/1965   Age: 56 y.o.   MRN: [id number]   Chief Complaint Chief Complaint  Patient presents with   Acute Visit    Cough     History of Present Illness The patient is a 56 year old male who presents with upper respiratory tract symptoms which began 2 days ago. He complains of non-productive cough and sinus congestion with clear nasal discharge and post nasal drip.  He denies any fevers, chills, chest congestion, nausea, vomiting, SOB, wheezing or other problems.  He has had 2 COVID-19 vaccines but no flu vaccines recently.  Alleviating factors: nyquil. The patient's past medical history is noncontributory.      Past Medical History Past Medical History:  Diagnosis Date   Atrial fibrillation (HCC)    a. Transient during adm 03/2013 for PCI.    CKD (chronic kidney disease) stage 2, GFR 60-89 ml/min    Cluster headache    Coronary artery disease    a. s/p DES x 2 to RCA 03/2013 - residual disease for med rx.   DM2 (diabetes mellitus, type 2) (HCC)    HLD (hyperlipidemia)    HTN (hypertension)    Low testosterone    Nonallopathic lesion of cervical region    OSA (obstructive sleep apnea)    Tobacco abuse    Torticollis, acute      Allergies Allergies  Allergen Reactions   Ranexa [Ranolazine] Nausea Only and Other (See Comments)    dizziness     Review of Systems Review of Systems  Constitutional:  Negative for chills and fever.  Eyes:  Negative for blurred vision.  Respiratory:  Positive for cough. Negative for sputum production, shortness of breath and wheezing.   Cardiovascular:  Negative for chest pain and palpitations.  Gastrointestinal:  Negative for diarrhea, nausea and vomiting.  Musculoskeletal:  Negative for myalgias.  Skin:  Negative for itching and rash.  Neurological:  Negative for dizziness, weakness and headaches.       Objective:    Vitals BP 118/70   Pulse 86   Resp 16   Ht  5\' 10"  (1.778 m)   Wt 198 lb 6.4 oz (90 kg)   SpO2 97%   BMI 28.47 kg/m    Physical Examination Physical Exam Constitutional:      Appearance: Normal appearance. He is not ill-appearing.  HENT:     Right Ear: Tympanic membrane, ear canal and external ear normal.     Left Ear: Tympanic membrane, ear canal and external ear normal.     Nose: Congestion present. No rhinorrhea.     Mouth/Throat:     Mouth: Mucous membranes are moist.     Pharynx: Oropharynx is clear. No oropharyngeal exudate or posterior oropharyngeal erythema.  Cardiovascular:     Rate and Rhythm: Normal rate and regular rhythm.     Pulses: Normal pulses.     Heart sounds: No murmur heard.    No friction rub. No gallop.  Pulmonary:     Effort: Pulmonary effort is normal. No respiratory distress.     Breath sounds: No wheezing, rhonchi or rales.  Abdominal:     General: Abdomen is flat. Bowel sounds are normal. There is no distension.     Palpations: Abdomen is soft.     Tenderness: There is no abdominal tenderness.  Musculoskeletal:     Right lower leg: No edema.     Left lower leg:  No edema.  Skin:    General: Skin is warm and dry.     Findings: No rash.  Neurological:     Mental Status: He is alert.        Assessment & Plan:   Viral upper respiratory tract infection He has clear post nasal drip.  This is most likely viral in nature.  We did a rapid COVID-19 antigen test and influenza testing and these were negative.  I asked him to do supportive care and I would like him to start on some flonase.    No follow-ups on file.   Crist Fat, MD

## 2022-06-05 ENCOUNTER — Other Ambulatory Visit: Payer: Self-pay

## 2022-06-05 MED ORDER — JANUMET XR 50-1000 MG PO TB24: 2.0000 | ORAL_TABLET | Freq: Every day | ORAL | 2 refills | Status: DC

## 2022-06-18 ENCOUNTER — Ambulatory Visit: Payer: BC Managed Care – PPO | Admitting: Internal Medicine

## 2022-06-18 ENCOUNTER — Encounter: Payer: Self-pay | Admitting: Internal Medicine

## 2022-06-18 VITALS — BP 118/84 | HR 92 | Temp 97.0°F | Resp 18 | Ht 70.0 in | Wt 201.4 lb

## 2022-06-18 DIAGNOSIS — M549 Dorsalgia, unspecified: Secondary | ICD-10-CM | POA: Insufficient documentation

## 2022-06-18 DIAGNOSIS — I2510 Atherosclerotic heart disease of native coronary artery without angina pectoris: Secondary | ICD-10-CM

## 2022-06-18 DIAGNOSIS — E785 Hyperlipidemia, unspecified: Secondary | ICD-10-CM | POA: Diagnosis not present

## 2022-06-18 DIAGNOSIS — E1169 Type 2 diabetes mellitus with other specified complication: Secondary | ICD-10-CM | POA: Diagnosis not present

## 2022-06-18 DIAGNOSIS — G8929 Other chronic pain: Secondary | ICD-10-CM

## 2022-06-18 DIAGNOSIS — I10 Essential (primary) hypertension: Secondary | ICD-10-CM | POA: Diagnosis not present

## 2022-06-18 DIAGNOSIS — M5441 Lumbago with sciatica, right side: Secondary | ICD-10-CM

## 2022-06-18 MED ORDER — JANUMET XR 50-1000 MG PO TB24: 2.0000 | ORAL_TABLET | Freq: Every day | ORAL | 4 refills | Status: DC

## 2022-06-18 NOTE — Progress Notes (Addendum)
Office Visit  Subjective   Patient ID: Matthew Horton   DOB: 09-06-1965   Age: 57 y.o.   MRN: [id number]   Chief Complaint Chief Complaint  Patient presents with   Follow-up    Surgical clearance      History of Present Illness 57 years old male who is here for follow-up and surgical clearance.  He has a history of coronary artery disease status post stent placement, diabetes mellitus, hypertension, hyperlipidemia and obstructive sleep apnea.  He also has a history of back pain radiating to right leg and for that he will undergoing decompression surgery in January.  He says that he cannot walk much because of back pain.  He denies any chest pain or shortness of breath.  He also has cardiology appointment for surgical clearance from cardiac point of view also. He has diabetes mellitus and blood sugar has been stable, his cholesterol has not been ideally controlled but LDL is below 100 and he is due for labs today. His blood pressure is controlled.  Past Medical History Past Medical History:  Diagnosis Date   Atrial fibrillation ([place])    a. Transient during adm 03/2013 for PCI.    CKD (chronic kidney disease) stage 2, GFR 60-89 ml/min    Cluster headache    Coronary artery disease    a. s/p DES x 2 to RCA 03/2013 - residual disease for med rx.   DM2 (diabetes mellitus, type 2) (HCC)    HLD (hyperlipidemia)    HTN (hypertension)    Low testosterone    Nonallopathic lesion of cervical region    OSA (obstructive sleep apnea)    Tobacco abuse    Torticollis, acute      Allergies Allergies  Allergen Reactions   Ranexa [Ranolazine] Nausea Only and Other (See Comments)    dizziness     Review of Systems Review of Systems  Constitutional: Negative.   HENT: Negative.    Respiratory: Negative.    Cardiovascular: Negative.   Gastrointestinal: Negative.   Neurological: Negative.        Objective:    Vitals BP 118/84 (BP Location: Left Arm, Patient Position: Sitting,  Cuff Size: Normal)   Pulse 92   Temp (!) 97 F (36.1 C)   Resp 18   Ht 5\' 10"  (1.778 m)   Wt 201 lb 6 oz (91.3 kg)   SpO2 96%   BMI 28.89 kg/m    Physical Examination Physical Exam Constitutional:      Appearance: Normal appearance.  HENT:     Head: Normocephalic and atraumatic.  Cardiovascular:     Rate and Rhythm: Normal rate and regular rhythm.     Heart sounds: Normal heart sounds.  Pulmonary:     Effort: Pulmonary effort is normal.     Breath sounds: Normal breath sounds.  Abdominal:     General: Bowel sounds are normal.     Palpations: Abdomen is soft.  Neurological:     General: No focal deficit present.     Mental Status: He is alert and oriented to person, place, and time.        Assessment & Plan:   HTN (hypertension) Controlled.  Dyslipidemia associated with type 2 diabetes mellitus (HCC) Will do hemoglobin A1c, urine microalbumin and lipid panel today.  Back pain He is going for laminectomy and decompression surgery of his lower back.  He is medically clear and will get cardiology clearance as well.    Return in about 3  months (around 09/17/2022).   Garwin Brothers, MD

## 2022-06-18 NOTE — Addendum Note (Signed)
Addended byGarwin Brothers on: 06/18/2022 11:15 AM   Modules accepted: Orders

## 2022-06-20 LAB — LIPID PANEL
Chol/HDL Ratio: 3.4 ratio (ref 0.0–5.0)
Cholesterol, Total: 172 mg/dL (ref 100–199)
HDL: 51 mg/dL (ref >39)
LDL Chol Calc (NIH): 97 mg/dL (ref 0–99)
Triglycerides: 134 mg/dL (ref 0–149)
VLDL Cholesterol Cal: 24 mg/dL (ref 5–40)

## 2022-06-20 LAB — MICROALBUMIN / CREATININE URINE RATIO
Creatinine, Urine: 163.7 mg/dL
Microalb/Creat Ratio: 29 mg/g creat (ref 0–29)
Microalbumin, Urine: 46.7 ug/mL

## 2022-06-20 LAB — HEMOGLOBIN A1C
Est. average glucose Bld gHb Est-mCnc: 169 mg/dL
Hgb A1c MFr Bld: 7.5 % — ABNORMAL HIGH (ref 4.8–5.6)

## 2022-06-20 NOTE — Assessment & Plan Note (Signed)
He is going for laminectomy and decompression surgery of his lower back.  He is medically clear and will get cardiology clearance as well.

## 2022-06-20 NOTE — Assessment & Plan Note (Signed)
Will do hemoglobin A1c, urine microalbumin and lipid panel today.

## 2022-06-20 NOTE — Assessment & Plan Note (Signed)
Controlled.  

## 2022-06-22 ENCOUNTER — Other Ambulatory Visit: Payer: Self-pay

## 2022-06-22 MED ORDER — JANUMET XR 50-1000 MG PO TB24: 2.0000 | ORAL_TABLET | Freq: Every day | ORAL | 4 refills | Status: DC

## 2022-06-23 NOTE — Progress Notes (Signed)
Called pt had to leave a message for him to call back

## 2022-06-24 NOTE — Progress Notes (Signed)
Patient called.  Patient aware.pt informed

## 2022-06-26 DIAGNOSIS — Z888 Allergy status to other drugs, medicaments and biological substances status: Secondary | ICD-10-CM | POA: Diagnosis not present

## 2022-06-26 DIAGNOSIS — Z7901 Long term (current) use of anticoagulants: Secondary | ICD-10-CM | POA: Diagnosis not present

## 2022-06-26 DIAGNOSIS — I2510 Atherosclerotic heart disease of native coronary artery without angina pectoris: Secondary | ICD-10-CM | POA: Diagnosis not present

## 2022-06-26 DIAGNOSIS — M47816 Spondylosis without myelopathy or radiculopathy, lumbar region: Secondary | ICD-10-CM | POA: Diagnosis not present

## 2022-06-26 DIAGNOSIS — E119 Type 2 diabetes mellitus without complications: Secondary | ICD-10-CM | POA: Diagnosis not present

## 2022-06-26 DIAGNOSIS — I10 Essential (primary) hypertension: Secondary | ICD-10-CM | POA: Diagnosis not present

## 2022-06-26 DIAGNOSIS — Z79899 Other long term (current) drug therapy: Secondary | ICD-10-CM | POA: Diagnosis not present

## 2022-06-26 DIAGNOSIS — M5126 Other intervertebral disc displacement, lumbar region: Secondary | ICD-10-CM | POA: Diagnosis not present

## 2022-06-26 DIAGNOSIS — M48061 Spinal stenosis, lumbar region without neurogenic claudication: Secondary | ICD-10-CM | POA: Diagnosis not present

## 2022-06-26 DIAGNOSIS — J45909 Unspecified asthma, uncomplicated: Secondary | ICD-10-CM | POA: Diagnosis not present

## 2022-06-26 DIAGNOSIS — Z951 Presence of aortocoronary bypass graft: Secondary | ICD-10-CM | POA: Diagnosis not present

## 2022-06-26 DIAGNOSIS — Z7902 Long term (current) use of antithrombotics/antiplatelets: Secondary | ICD-10-CM | POA: Diagnosis not present

## 2022-06-26 DIAGNOSIS — Z7982 Long term (current) use of aspirin: Secondary | ICD-10-CM | POA: Diagnosis not present

## 2022-07-02 ENCOUNTER — Ambulatory Visit: Payer: 59 | Admitting: Internal Medicine

## 2022-07-23 DIAGNOSIS — Z1330 Encounter for screening examination for mental health and behavioral disorders, unspecified: Secondary | ICD-10-CM | POA: Diagnosis not present

## 2022-07-23 DIAGNOSIS — Z48811 Encounter for surgical aftercare following surgery on the nervous system: Secondary | ICD-10-CM | POA: Diagnosis not present

## 2022-09-17 ENCOUNTER — Ambulatory Visit: Payer: 59 | Admitting: Internal Medicine

## 2022-10-22 DIAGNOSIS — I519 Heart disease, unspecified: Secondary | ICD-10-CM | POA: Diagnosis not present

## 2022-10-22 DIAGNOSIS — M5136 Other intervertebral disc degeneration, lumbar region: Secondary | ICD-10-CM | POA: Diagnosis not present

## 2022-10-22 DIAGNOSIS — M47816 Spondylosis without myelopathy or radiculopathy, lumbar region: Secondary | ICD-10-CM | POA: Diagnosis not present

## 2022-10-28 ENCOUNTER — Other Ambulatory Visit: Payer: Self-pay | Admitting: Internal Medicine

## 2022-11-01 DIAGNOSIS — M5126 Other intervertebral disc displacement, lumbar region: Secondary | ICD-10-CM | POA: Diagnosis not present

## 2022-11-01 DIAGNOSIS — M5137 Other intervertebral disc degeneration, lumbosacral region: Secondary | ICD-10-CM | POA: Diagnosis not present

## 2022-11-01 DIAGNOSIS — M47817 Spondylosis without myelopathy or radiculopathy, lumbosacral region: Secondary | ICD-10-CM | POA: Diagnosis not present

## 2022-11-01 DIAGNOSIS — M47816 Spondylosis without myelopathy or radiculopathy, lumbar region: Secondary | ICD-10-CM | POA: Diagnosis not present

## 2022-11-01 DIAGNOSIS — M5136 Other intervertebral disc degeneration, lumbar region: Secondary | ICD-10-CM | POA: Diagnosis not present

## 2022-11-03 ENCOUNTER — Encounter: Payer: Self-pay | Admitting: Internal Medicine

## 2022-11-03 ENCOUNTER — Ambulatory Visit: Payer: 59 | Admitting: Internal Medicine

## 2022-11-03 VITALS — BP 124/80 | HR 91 | Temp 97.7°F | Resp 18 | Ht 70.0 in | Wt 203.5 lb

## 2022-11-03 DIAGNOSIS — J4520 Mild intermittent asthma, uncomplicated: Secondary | ICD-10-CM

## 2022-11-03 DIAGNOSIS — E785 Hyperlipidemia, unspecified: Secondary | ICD-10-CM | POA: Diagnosis not present

## 2022-11-03 DIAGNOSIS — M5441 Lumbago with sciatica, right side: Secondary | ICD-10-CM

## 2022-11-03 DIAGNOSIS — E1169 Type 2 diabetes mellitus with other specified complication: Secondary | ICD-10-CM

## 2022-11-03 DIAGNOSIS — I2510 Atherosclerotic heart disease of native coronary artery without angina pectoris: Secondary | ICD-10-CM

## 2022-11-03 DIAGNOSIS — I10 Essential (primary) hypertension: Secondary | ICD-10-CM | POA: Diagnosis not present

## 2022-11-03 DIAGNOSIS — G8929 Other chronic pain: Secondary | ICD-10-CM

## 2022-11-03 DIAGNOSIS — K219 Gastro-esophageal reflux disease without esophagitis: Secondary | ICD-10-CM

## 2022-11-03 DIAGNOSIS — J45909 Unspecified asthma, uncomplicated: Secondary | ICD-10-CM | POA: Insufficient documentation

## 2022-11-03 MED ORDER — MONTELUKAST SODIUM 10 MG PO TABS: 10.0000 mg | ORAL_TABLET | Freq: Every day | ORAL | 2 refills | Status: DC

## 2022-11-03 MED ORDER — PANTOPRAZOLE SODIUM 40 MG PO TBEC: 40.0000 mg | DELAYED_RELEASE_TABLET | Freq: Every day | ORAL | 3 refills | Status: DC

## 2022-11-03 NOTE — Assessment & Plan Note (Signed)
Stable, no chest pain, he will follow with cardiologist in October

## 2022-11-03 NOTE — Progress Notes (Addendum)
Office Visit  Subjective   Patient ID: Matthew Horton   DOB: [birth year]   Age: 57 y.o.   MRN: [id number]   Chief Complaint Chief Complaint  Patient presents with   Check up    Possible blood work     History of Present Illness 57 years old male who is here for follow-up.   He check his sugar at home and says that his sugar is better. He is due for diabetic eye examination.  No hypoglycemia.  His last HbA1c was 7.7. He take 2 sitagliptan/metformin.   He has a history of coronary artery disease status post stent placement.  He denies any chest pain and he follows with cardiologist once a year.  His next appointment is in October.  He did take aspirin, Plavix and beta-blocker.  He has back pain radiating to right leg and for that he had undergone decompression surgery and says that they are going to have another surgery done.  He had MRI done last week.  He will continue to follow with spine surgeon.   He has hyperlipidemia and take atorvastatin and Zetia.  He is fasting today for labs drawn.    His blood pressure is controlled.  Past Medical History Past Medical History:  Diagnosis Date   Atrial fibrillation (HCC)    a. Transient during adm 03/2013 for PCI.    CKD (chronic kidney disease) stage 2, GFR 60-89 ml/min    Cluster headache    Coronary artery disease    a. s/p DES x 2 to RCA 03/2013 - residual disease for med rx.   DM2 (diabetes mellitus, type 2) (HCC)    HLD (hyperlipidemia)    HTN (hypertension)    Low testosterone    Nonallopathic lesion of cervical region    OSA (obstructive sleep apnea)    Tobacco abuse    Torticollis, acute      Allergies Allergies  Allergen Reactions   Ranexa [Ranolazine] Nausea Only and Other (See Comments)    dizziness     Review of Systems Review of Systems  Constitutional: Negative.   HENT: Negative.    Respiratory: Negative.    Cardiovascular: Negative.   Gastrointestinal: Negative.   Musculoskeletal:  Positive for back  pain.  Neurological: Negative.        Objective:    Vitals BP 124/80 (BP Location: Left Arm, Patient Position: Sitting, Cuff Size: Normal)   Pulse 91   Temp 97.7 F (36.5 C)   Resp 18   Ht 5\' 10"  (1.778 m)   Wt 203 lb 8 oz (92.3 kg)   SpO2 97%   BMI 29.20 kg/m    Physical Examination Physical Exam Constitutional:      Appearance: Normal appearance.  HENT:     Head: Normocephalic and atraumatic.  Cardiovascular:     Rate and Rhythm: Normal rate and regular rhythm.     Heart sounds: Normal heart sounds.  Pulmonary:     Effort: Pulmonary effort is normal.     Breath sounds: Normal breath sounds.  Abdominal:     General: Bowel sounds are normal.     Palpations: Abdomen is soft.  Neurological:     General: No focal deficit present.     Mental Status: He is alert and oriented to person, place, and time.        Assessment & Plan:   Asthma I will add singulair 10 mg daily  Dyslipidemia associated with type 2 diabetes mellitus (HCC) Will do  lipid panel,HbA1c and urine microalbuminurea  Back pain He has MRI done and he will do surgery for back pain  CAD in native artery Stable, no chest pain, he will follow with cardiologist in October  GERD (gastroesophageal reflux disease) He take pantoprazole 40 mg daily    Return in about 3 months (around 02/03/2023).   [name], MD

## 2022-11-03 NOTE — Assessment & Plan Note (Signed)
He take pantoprazole 40 mg daily 

## 2022-11-03 NOTE — Assessment & Plan Note (Signed)
Will do lipid panel,HbA1c and urine microalbuminurea

## 2022-11-03 NOTE — Assessment & Plan Note (Signed)
I will add singulair 10 mg daily

## 2022-11-03 NOTE — Assessment & Plan Note (Signed)
He has MRI done and he will do surgery for back pain

## 2022-11-04 LAB — CMP14 + ANION GAP
ALT: 16 IU/L (ref 0–44)
AST: 10 IU/L (ref 0–40)
Albumin/Globulin Ratio: 2.2 (ref 1.2–2.2)
Albumin: 4.3 g/dL (ref 3.8–4.9)
Alkaline Phosphatase: 85 IU/L (ref 44–121)
Anion Gap: 15 mmol/L (ref 10.0–18.0)
BUN/Creatinine Ratio: 14 (ref 9–20)
BUN: 16 mg/dL (ref 6–24)
Bilirubin Total: 0.3 mg/dL (ref 0.0–1.2)
CO2: 24 mmol/L (ref 20–29)
Calcium: 10.1 mg/dL (ref 8.7–10.2)
Chloride: 103 mmol/L (ref 96–106)
Creatinine, Ser: 1.11 mg/dL (ref 0.76–1.27)
Globulin, Total: 2 g/dL (ref 1.5–4.5)
Glucose: 159 mg/dL — ABNORMAL HIGH (ref 70–99)
Potassium: 4.8 mmol/L (ref 3.5–5.2)
Sodium: 142 mmol/L (ref 134–144)
Total Protein: 6.3 g/dL (ref 6.0–8.5)
eGFR: 77 mL/min/{1.73_m2} (ref >59)

## 2022-11-04 LAB — MICROALBUMIN / CREATININE URINE RATIO
Creatinine, Urine: 223.8 mg/dL
Microalb/Creat Ratio: 15 mg/g creat (ref 0–29)
Microalbumin, Urine: 33.4 ug/mL

## 2022-11-04 LAB — LIPID PANEL
Chol/HDL Ratio: 3.6 ratio (ref 0.0–5.0)
Cholesterol, Total: 108 mg/dL (ref 100–199)
HDL: 30 mg/dL — ABNORMAL LOW (ref >39)
LDL Chol Calc (NIH): 58 mg/dL (ref 0–99)
Triglycerides: 109 mg/dL (ref 0–149)
VLDL Cholesterol Cal: 20 mg/dL (ref 5–40)

## 2022-11-04 LAB — HEMOGLOBIN A1C
Est. average glucose Bld gHb Est-mCnc: 154 mg/dL
Hgb A1c MFr Bld: 7 % — ABNORMAL HIGH (ref 4.8–5.6)

## 2022-11-05 ENCOUNTER — Other Ambulatory Visit: Payer: Self-pay | Admitting: Internal Medicine

## 2022-11-12 ENCOUNTER — Telehealth: Payer: Self-pay | Admitting: Internal Medicine

## 2022-11-12 NOTE — Telephone Encounter (Signed)
I have spoken with patient about his lab results and will continue with current medications.

## 2022-11-23 DIAGNOSIS — M47816 Spondylosis without myelopathy or radiculopathy, lumbar region: Secondary | ICD-10-CM | POA: Diagnosis not present

## 2022-11-23 DIAGNOSIS — M5136 Other intervertebral disc degeneration, lumbar region: Secondary | ICD-10-CM | POA: Diagnosis not present

## 2022-11-25 DIAGNOSIS — M47816 Spondylosis without myelopathy or radiculopathy, lumbar region: Secondary | ICD-10-CM | POA: Diagnosis not present

## 2022-11-25 DIAGNOSIS — M5136 Other intervertebral disc degeneration, lumbar region: Secondary | ICD-10-CM | POA: Diagnosis not present

## 2022-11-30 DIAGNOSIS — M5136 Other intervertebral disc degeneration, lumbar region: Secondary | ICD-10-CM | POA: Diagnosis not present

## 2022-11-30 DIAGNOSIS — M47816 Spondylosis without myelopathy or radiculopathy, lumbar region: Secondary | ICD-10-CM | POA: Diagnosis not present

## 2022-12-02 DIAGNOSIS — M5136 Other intervertebral disc degeneration, lumbar region: Secondary | ICD-10-CM | POA: Diagnosis not present

## 2022-12-02 DIAGNOSIS — M47816 Spondylosis without myelopathy or radiculopathy, lumbar region: Secondary | ICD-10-CM | POA: Diagnosis not present

## 2022-12-08 DIAGNOSIS — M47816 Spondylosis without myelopathy or radiculopathy, lumbar region: Secondary | ICD-10-CM | POA: Diagnosis not present

## 2022-12-08 DIAGNOSIS — M5136 Other intervertebral disc degeneration, lumbar region: Secondary | ICD-10-CM | POA: Diagnosis not present

## 2022-12-10 DIAGNOSIS — M5136 Other intervertebral disc degeneration, lumbar region: Secondary | ICD-10-CM | POA: Diagnosis not present

## 2022-12-10 DIAGNOSIS — M47816 Spondylosis without myelopathy or radiculopathy, lumbar region: Secondary | ICD-10-CM | POA: Diagnosis not present

## 2022-12-14 DIAGNOSIS — M5136 Other intervertebral disc degeneration, lumbar region: Secondary | ICD-10-CM | POA: Diagnosis not present

## 2022-12-14 DIAGNOSIS — M47816 Spondylosis without myelopathy or radiculopathy, lumbar region: Secondary | ICD-10-CM | POA: Diagnosis not present

## 2022-12-17 DIAGNOSIS — I2510 Atherosclerotic heart disease of native coronary artery without angina pectoris: Secondary | ICD-10-CM | POA: Diagnosis not present

## 2022-12-17 DIAGNOSIS — R5383 Other fatigue: Secondary | ICD-10-CM | POA: Diagnosis not present

## 2022-12-17 DIAGNOSIS — R1013 Epigastric pain: Secondary | ICD-10-CM | POA: Diagnosis not present

## 2022-12-17 DIAGNOSIS — R0789 Other chest pain: Secondary | ICD-10-CM | POA: Diagnosis not present

## 2022-12-17 DIAGNOSIS — I10 Essential (primary) hypertension: Secondary | ICD-10-CM | POA: Diagnosis not present

## 2022-12-17 DIAGNOSIS — R9431 Abnormal electrocardiogram [ECG] [EKG]: Secondary | ICD-10-CM | POA: Diagnosis not present

## 2022-12-17 DIAGNOSIS — Z79899 Other long term (current) drug therapy: Secondary | ICD-10-CM | POA: Diagnosis not present

## 2022-12-17 DIAGNOSIS — K6389 Other specified diseases of intestine: Secondary | ICD-10-CM | POA: Diagnosis not present

## 2022-12-17 DIAGNOSIS — K2100 Gastro-esophageal reflux disease with esophagitis, without bleeding: Secondary | ICD-10-CM | POA: Diagnosis not present

## 2022-12-17 DIAGNOSIS — K5730 Diverticulosis of large intestine without perforation or abscess without bleeding: Secondary | ICD-10-CM | POA: Diagnosis not present

## 2022-12-17 DIAGNOSIS — R0600 Dyspnea, unspecified: Secondary | ICD-10-CM | POA: Diagnosis not present

## 2022-12-17 DIAGNOSIS — R079 Chest pain, unspecified: Secondary | ICD-10-CM | POA: Diagnosis not present

## 2022-12-17 DIAGNOSIS — E119 Type 2 diabetes mellitus without complications: Secondary | ICD-10-CM | POA: Diagnosis not present

## 2022-12-17 DIAGNOSIS — Z888 Allergy status to other drugs, medicaments and biological substances status: Secondary | ICD-10-CM | POA: Diagnosis not present

## 2022-12-17 DIAGNOSIS — N200 Calculus of kidney: Secondary | ICD-10-CM | POA: Diagnosis not present

## 2022-12-17 DIAGNOSIS — K2289 Other specified disease of esophagus: Secondary | ICD-10-CM | POA: Diagnosis not present

## 2022-12-17 DIAGNOSIS — Z87891 Personal history of nicotine dependence: Secondary | ICD-10-CM | POA: Diagnosis not present

## 2022-12-18 DIAGNOSIS — I2510 Atherosclerotic heart disease of native coronary artery without angina pectoris: Secondary | ICD-10-CM | POA: Diagnosis not present

## 2022-12-18 DIAGNOSIS — K6389 Other specified diseases of intestine: Secondary | ICD-10-CM | POA: Diagnosis not present

## 2022-12-18 DIAGNOSIS — K2289 Other specified disease of esophagus: Secondary | ICD-10-CM | POA: Diagnosis not present

## 2022-12-18 DIAGNOSIS — R1013 Epigastric pain: Secondary | ICD-10-CM | POA: Diagnosis not present

## 2022-12-18 DIAGNOSIS — N200 Calculus of kidney: Secondary | ICD-10-CM | POA: Diagnosis not present

## 2022-12-18 DIAGNOSIS — K5730 Diverticulosis of large intestine without perforation or abscess without bleeding: Secondary | ICD-10-CM | POA: Diagnosis not present

## 2022-12-22 DIAGNOSIS — M5416 Radiculopathy, lumbar region: Secondary | ICD-10-CM | POA: Diagnosis not present

## 2022-12-22 DIAGNOSIS — M5136 Other intervertebral disc degeneration, lumbar region: Secondary | ICD-10-CM | POA: Diagnosis not present

## 2022-12-25 DIAGNOSIS — M5136 Other intervertebral disc degeneration, lumbar region: Secondary | ICD-10-CM | POA: Diagnosis not present

## 2022-12-25 DIAGNOSIS — M47816 Spondylosis without myelopathy or radiculopathy, lumbar region: Secondary | ICD-10-CM | POA: Diagnosis not present

## 2022-12-28 ENCOUNTER — Encounter: Payer: Self-pay | Admitting: Physician Assistant

## 2022-12-28 ENCOUNTER — Ambulatory Visit: Payer: BC Managed Care – PPO | Attending: Physician Assistant | Admitting: Physician Assistant

## 2022-12-28 VITALS — BP 98/66 | HR 94 | Ht 70.0 in | Wt 202.0 lb

## 2022-12-28 DIAGNOSIS — M47816 Spondylosis without myelopathy or radiculopathy, lumbar region: Secondary | ICD-10-CM | POA: Diagnosis not present

## 2022-12-28 DIAGNOSIS — I10 Essential (primary) hypertension: Secondary | ICD-10-CM

## 2022-12-28 DIAGNOSIS — Z01818 Encounter for other preprocedural examination: Secondary | ICD-10-CM | POA: Diagnosis not present

## 2022-12-28 DIAGNOSIS — E785 Hyperlipidemia, unspecified: Secondary | ICD-10-CM

## 2022-12-28 DIAGNOSIS — I2510 Atherosclerotic heart disease of native coronary artery without angina pectoris: Secondary | ICD-10-CM | POA: Diagnosis not present

## 2022-12-28 DIAGNOSIS — M5136 Other intervertebral disc degeneration, lumbar region: Secondary | ICD-10-CM | POA: Diagnosis not present

## 2022-12-28 DIAGNOSIS — E119 Type 2 diabetes mellitus without complications: Secondary | ICD-10-CM

## 2022-12-28 NOTE — Progress Notes (Unsigned)
Cardiology Office Note:  .   Date:  12/29/2022  ID:  Matthew Horton, DOB 1965-10-18, MRN [id number] PCP: [name], MD  [facility] Providers Cardiologist:  Chrystie Nose, MD     History of Present Illness: .   Matthew Horton is a 57 y.o. male with a hx of CAD on cath 08/2012, hypertension, hyperlipidemia, DM2, obstructive sleep apnea and longstanding tobacco abuse. Patient had abnormal stress test in 2014, subsequent cardiac catheterization in March 2014 revealed moderate two-vessel CAD in the diagonal branch of LAD and RCA, there was also ostial OM1 lesion. FFR of RCA 70% lesion was nonsignificant. Medical therapy was recommended. Due to recurrent symptom, patient underwent cardiac catheterization October 2014 again and found to have severe two-vessel disease in mid RCA and mid PDA demonstrating significant progression of disease, both were treated with drug-eluting stent. Postprocedure, he had A-fib with RVR in the Cath Lab. He quickly converted back to sinus rhythm after moving to the floor. Cath in Feb 2015 showed 95% stenosis in very small prox branch of diagonal artery, 7-80% stenosis in diagonal at the site of previous narrowing, 80% mid LAD intramyocardial bridging during systole that improve to 50% with intracoronary nitroglycerine. Patient underwent a Myoview at [facility] on 08/14/2014 that showed inducible ischemia in the inferior lateral and lateral apical segment, peri-infarct ischemia in the inferior wall and the basal inferoseptum, normal EF. This led to a stress echo which came back mildly abnormal. He subsequently underwent cardiac catheterization on 08/21/2014 at [facility]. He underwent repeat cardiac catheterization which was negative for new obstructive disease. Myoview in 2020 was negative. Repeat echocardiogram at the time showed low normal EF of 50 to 55%. Patient was last seen by Dr. Rennis Golden in October 2021 at which time he continued to require DOT driving license. He had a repeat  Myoview on 03/06/2021 which came back low risk, no evidence of ischemia, EF 52%. He went neck fusion surgery in November 2022.   I last saw the patient in October 2023 at which time he had chest discomfort with more strenuous activity but not with every day activity.  His dyspnea on exertion was unchanged.  He has been followed by [facility] neurosurgeons for low back pain.  MRI of the lumbar spine in May 2024 demonstrated lumbar disc bulging and degenerative changes.  Most recently, patient presented to [facility] ED on 12/17/2022 for chest discomfort.  He describes the symptom as stabbing pain in the center of his chest.  He also described shortness of breath with exertion over the past several months.  D-dimer was normal.  proBNP was normal.  Serial troponin negative x 2, 12-->13.  Chest x-ray showed no acute pulmonary disease.  Patient was discharged to follow-up with cardiology service as outpatient.  Neurosurgery recommended anterior lumbar interbody fusion surgery and a decompressive lumbar laminectomy.  Cardiac clearance was requested.  He will need to stop Plavix for 7 days prior to the surgery.   Talking with the patient today, he continued to have chest discomfort, however the chest discomfort only lasted about a second each time.  He described the chest discomfort as sharp shooting pain.  He says he has not noticed spikes on the telemetry before when he was seen in the emergency room back corresponding to the timing of the sharp shooting pain.  I question if this sharp shooting pain he is experiencing is related to his symptomatic PVCs.  I discussed his case with Dr. Royann Shivers DOD.  Patient  has severe back pain and was writhing in the chair during the entire interview.  I do not think he can tolerate a nuclear stress test.  Given reassuring Myoview again in September 2022 and atypical nature of his chest discomfort, he is at acceptable risk to proceed with back surgery.  He has been instructed to hold Plavix for  7 days prior to the surgery.  We would prefer him to continue on the aspirin through the surgery, however if needed, may need to hold aspirin as well.  ROS:   Patient complains of intermittent sharp shooting pain that lasts a second each time.  Symptom has been going on for many years and has not shown significant exacerbation.  He also describes severe back pain.  Studies Reviewed: Marland Kitchen   EKG Interpretation Date/Time:  Monday December 28 2022 14:58:16 EDT Ventricular Rate:  91 PR Interval:  142 QRS Duration:  92 QT Interval:  364 QTC Calculation: 447 R Axis:   75  Text Interpretation: Normal sinus rhythm Incomplete right bundle branch block No significant ST-T wave changes Confirmed by Azalee Course [phone]) on 12/28/2022 3:00:05 PM    Cardiac Studies & Procedures   CARDIAC CATHETERIZATION  CARDIAC CATHETERIZATION 08/21/2014   STRESS TESTS  MYOCARDIAL PERFUSION IMAGING 03/06/2021  Narrative   The study is normal. The study is low risk.   No ST deviation was noted.   LV perfusion is normal. There is no evidence of ischemia. There is no evidence of infarction.   Left ventricular function is abnormal. Global function is mildly reduced. Nuclear stress EF: 52 %. The left ventricular ejection fraction is mildly decreased (45-54%). End diastolic cavity size is normal.   Prior study available for comparison from 04/11/2019.  Low risk stress nuclear study with normal perfusion and borderline reduced left ventricular global systolic function. No change from previous study (EF was 48%).   ECHOCARDIOGRAM  ECHOCARDIOGRAM LIMITED 04/20/2019  Narrative ECHOCARDIOGRAM LIMITED REPORT    Patient Name:   Matthew Horton Date of Exam: 04/20/2019 Medical Rec #:  [id number]         Height:       70.0 in Accession #:    [id number]        Weight:       215.0 lb Date of Birth:  [birth year]         BSA:          2.15 m Patient Age:    53 years          BP:           154/96 mmHg Patient Gender: M                  HR:           71 bpm. Exam Location:  [address]   Procedure: Limited Echo, 3D Echo, Limited Color Doppler and Cardiac Doppler  Indications:    I25.10 CAD Z02.89 DOT Physical  History:        Patient has no prior history of Echocardiogram examinations. CAD; Arrythmias:Atrial Fibrillation Signs/Symptoms:Chest Pain Risk Factors:Sleep Apnea, Hypertension, Diabetes, Dyslipidemia, Former Smoker and Family History of Coronary Artery Disease. Abnormal Stress Testing for DOT Evaluation, Chronic Kidney Disease, Coronary Stents.  Sonographer:    Farrel Conners RDCS Referring Phys: [phone] KENNETH C HILTY  IMPRESSIONS   1. Left ventricular ejection fraction, by visual estimation, is 50 to 55%. The left ventricle has normal function. There is no left ventricular hypertrophy. 2. Elevated left  ventricular end-diastolic pressure. 3. Left ventricular diastolic parameters are consistent with Grade II diastolic dysfunction (pseudonormalization). 4. Mildly dilated left ventricular internal cavity size. 5. Global right ventricle has normal systolic function.The right ventricular size is normal. No increase in right ventricular wall thickness. 6. Left atrial size was normal. 7. Right atrial size was normal. 8. The mitral valve is normal in structure. No evidence of mitral valve regurgitation. No evidence of mitral stenosis. 9. The tricuspid valve is normal in structure. Tricuspid valve regurgitation is not demonstrated. 10. The aortic valve is normal in structure. Aortic valve regurgitation is not visualized. No evidence of aortic valve sclerosis or stenosis. 11. The pulmonic valve was normal in structure. Pulmonic valve regurgitation is not visualized. 12. TR signal is inadequate for assessing pulmonary artery systolic pressure. 13. The inferior vena cava is normal in size with greater than 50% respiratory variability, suggesting right atrial pressure of 3 mmHg.  FINDINGS Left Ventricle: Left  ventricular ejection fraction, by visual estimation, is 50 to 55%. The left ventricle has normal function. The left ventricular internal cavity size was mildly dilated left ventricle. There is no left ventricular hypertrophy. Left ventricular diastolic parameters are consistent with Grade II diastolic dysfunction (pseudonormalization). Elevated left ventricular end-diastolic pressure.  Right Ventricle: The right ventricular size is normal. No increase in right ventricular wall thickness. Global RV systolic function is has normal systolic function.  Left Atrium: Left atrial size was normal in size.  Right Atrium: Right atrial size was normal in size  Pericardium: There is no evidence of pericardial effusion.  Mitral Valve: The mitral valve is normal in structure. No evidence of mitral valve stenosis by observation. MV Area by PHT, 3.81 cm. MV PHT, 57.71 msec. No evidence of mitral valve regurgitation.  Tricuspid Valve: The tricuspid valve is normal in structure. Tricuspid valve regurgitation is not demonstrated.  Aortic Valve: The aortic valve is normal in structure. Aortic valve regurgitation is not visualized. The aortic valve is structurally normal, with no evidence of sclerosis or stenosis.  Pulmonic Valve: The pulmonic valve was normal in structure. Pulmonic valve regurgitation is not visualized.  Aorta: The aortic root, ascending aorta and aortic arch are all structurally normal, with no evidence of dilitation or obstruction.  Venous: The inferior vena cava is normal in size with greater than 50% respiratory variability, suggesting right atrial pressure of 3 mmHg.  Shunts: No ventricular septal defect is seen or detected. There is no evidence of an atrial septal defect. No atrial level shunt detected by color flow Doppler.   LEFT VENTRICLE          Normals PLAX 2D LVIDd:         5.55 cm  3.6 cm   Diastology                 Normals LVIDs:         4.40 cm  1.7 cm   LV e' lateral:    9.36 cm/s 6.42 cm/s LV PW:         0.91 cm  1.4 cm   LV E/e' lateral: 11.2      15.4 LV IVS:        0.70 cm  1.3 cm   LV e' medial:    6.09 cm/s 6.96 cm/s LVOT diam:     2.30 cm  2.0 cm   LV E/e' medial:  17.2      6.96 LV SV:         63 ml  79 ml LV SV Index:   28.36    45 ml/m2 LVOT Area:     4.15 cm 3.14 cm2   RIGHT VENTRICLE TAPSE (M-mode): 2.2 cm  LEFT ATRIUM           Index       RIGHT ATRIUM           Index LA diam:      3.60 cm 1.67 cm/m  RA Area:     15.20 cm LA Vol (A4C): 56.5 ml 26.25 ml/m RA Volume:   40.30 ml  18.72 ml/m AORTIC VALVE             Normals LVOT Vmax:   92.30 cm/s LVOT Vmean:  59.400 cm/s 75 cm/s LVOT VTI:    0.206 m     25.3 cm  AORTA                 Normals Ao Root diam: 3.70 cm 31 mm Ao Asc diam:  3.10 cm 31 mm  MITRAL VALVE            Normals MV Area (PHT): cm                   SHUNTS MV PHT:        msec     55 ms        Systemic VTI:  0.21 m MV Decel Time: 199 msec 187 ms       Systemic Diam: 2.30 cm MV E velocity: 104.70 cm/s 103 cm/s MV A velocity: 86.35 cm/s  70.3 cm/s MV E/A ratio:  1.21        1.5   Armanda Magic MD Electronically signed by Armanda Magic MD Signature Date/Time: 04/20/2019/10:45:11 AM    Final             Risk Assessment/Calculations:            Physical Exam:   VS:  BP 98/66   Pulse 94   Ht 5\' 10"  (1.778 m)   Wt 202 lb (91.6 kg)   SpO2 97%   BMI 28.98 kg/m    Wt Readings from Last 3 Encounters:  12/28/22 202 lb (91.6 kg)  11/03/22 203 lb 8 oz (92.3 kg)  06/18/22 201 lb 6 oz (91.3 kg)    GEN: Well nourished, well developed in no acute distress NECK: No JVD; No carotid bruits CARDIAC: RRR, no murmurs, rubs, gallops RESPIRATORY:  Clear to auscultation without rales, wheezing or rhonchi  ABDOMEN: Soft, non-tender, non-distended EXTREMITIES:  No edema; No deformity   ASSESSMENT AND PLAN: .    Preoperative clearance: Pending upcoming back surgery.  He has chronic chest pain, he describes his chest  pain as a sharp shooting pain that would last a second each time.  He is unable to tolerate nuclear stress test as he cannot lay down for long period of time due to severe back pain.  I discussed his case with DOD Dr. Royann Shivers, we feel he is at acceptable risk to proceed with back surgery from the cardiac perspective.  He may hold Plavix for 7 days prior to the surgery and restart as soon as possible afterward at the surgeon's discretion.  CAD: Myoview in September 2022 was low risk  Hypertension: Blood pressure stable  Hyperlipidemia: On Lipitor  DM2: Managed by primary care provider.       Dispo: Keep follow-up with Dr. Rennis Golden  Signed, Azalee Course, PA

## 2022-12-28 NOTE — Patient Instructions (Addendum)
Medication Instructions:  Your physician recommends that you continue on your current medications as directed. Please refer to the Current Medication list given to you today.  *If you need a refill on your cardiac medications before your next appointment, please call your pharmacy*   Lab Work: NONE If you have labs (blood work) drawn today and your tests are completely normal, you will receive your results only by: MyChart Message (if you have MyChart) OR A paper copy in the mail If you have any lab test that is abnormal or we need to change your treatment, we will call you to review the results.   Testing/Procedures: NONE   Follow-Up: At [facility], you and your health needs are our priority.  As part of our continuing mission to provide you with exceptional heart care, we have created designated Provider Care Teams.  These Care Teams include your primary Cardiologist (physician) and Advanced Practice Providers (APPs -  Physician Assistants and Nurse Practitioners) who all work together to provide you with the care you need, when you need it.  We recommend signing up for the patient portal called "MyChart".  Sign up information is provided on this After Visit Summary.  MyChart is used to connect with patients for Virtual Visits (Telemedicine).  Patients are able to view lab/test results, encounter notes, upcoming appointments, etc.  Non-urgent messages can be sent to your provider as well.   To learn more about what you can do with MyChart, go to [email or web address].    Your next appointment:   KEEP SCHEDULED APPOINTMENT FOR 03/17/2023 2:45 PM   Provider:   Chrystie Nose, MD   Other Instructions YOU HAVE BEEN CLEARED FOR  SURGERY FROM A CARDIAC PROVIDER

## 2022-12-30 DIAGNOSIS — M5136 Other intervertebral disc degeneration, lumbar region: Secondary | ICD-10-CM | POA: Diagnosis not present

## 2022-12-30 DIAGNOSIS — M47816 Spondylosis without myelopathy or radiculopathy, lumbar region: Secondary | ICD-10-CM | POA: Diagnosis not present

## 2023-01-04 DIAGNOSIS — M5136 Other intervertebral disc degeneration, lumbar region: Secondary | ICD-10-CM | POA: Diagnosis not present

## 2023-01-04 DIAGNOSIS — M47816 Spondylosis without myelopathy or radiculopathy, lumbar region: Secondary | ICD-10-CM | POA: Diagnosis not present

## 2023-01-06 DIAGNOSIS — Z86010 Personal history of colonic polyps: Secondary | ICD-10-CM | POA: Diagnosis not present

## 2023-01-06 DIAGNOSIS — R933 Abnormal findings on diagnostic imaging of other parts of digestive tract: Secondary | ICD-10-CM | POA: Diagnosis not present

## 2023-01-06 DIAGNOSIS — K219 Gastro-esophageal reflux disease without esophagitis: Secondary | ICD-10-CM | POA: Diagnosis not present

## 2023-01-07 ENCOUNTER — Telehealth: Payer: Self-pay

## 2023-01-07 NOTE — Telephone Encounter (Signed)
   Patient Name: Matthew Horton  DOB: [birth year] MRN: [id number]  Primary Cardiologist: Chrystie Nose, MD  Chart reviewed as part of pre-operative protocol coverage. Given past medical history and time since last visit, based on ACC/AHA guidelines, GENNARO LIZOTTE is at acceptable risk for the planned procedure without further cardiovascular testing.   Per office protocol, if patient is without any new symptoms or concerns at the time of their virtual visit, he/she may hold Plavix for 5 days prior to procedure. Please resume Plavix as soon as possible postprocedure, at the discretion of the surgeon.    The patient was advised that if he develops new symptoms prior to surgery to contact our office to arrange for a follow-up visit, and he verbalized understanding.  I will route this recommendation to the requesting party via Epic fax function and remove from pre-op pool.  Please call with questions.  Joni Reining, NP 01/07/2023, 10:53 AM

## 2023-01-07 NOTE — Telephone Encounter (Signed)
   Pre-operative Risk Assessment    Patient Name: Matthew Horton  DOB: 09/19/1965 MRN: [id number]      Request for Surgical Clearance    Procedure:   Endoscopy and Colonoscopy  Date of Surgery:  Clearance 01/20/23                                 Surgeon:  Dr, Cathleen Fears Group or Practice Name:  [facility] Phone number:  [phone] Fax number:  [phone]   Type of Clearance Requested:   - Medical  - Pharmacy:  Hold Clopidogrel (Plavix) x 4 days   Type of Anesthesia:  Not Indicated   Additional requests/questions:    Garrel Ridgel   01/07/2023, 9:46 AM

## 2023-01-11 DIAGNOSIS — M5136 Other intervertebral disc degeneration, lumbar region: Secondary | ICD-10-CM | POA: Diagnosis not present

## 2023-01-11 DIAGNOSIS — M47816 Spondylosis without myelopathy or radiculopathy, lumbar region: Secondary | ICD-10-CM | POA: Diagnosis not present

## 2023-01-13 DIAGNOSIS — M5136 Other intervertebral disc degeneration, lumbar region: Secondary | ICD-10-CM | POA: Diagnosis not present

## 2023-01-13 DIAGNOSIS — M47816 Spondylosis without myelopathy or radiculopathy, lumbar region: Secondary | ICD-10-CM | POA: Diagnosis not present

## 2023-01-21 ENCOUNTER — Ambulatory Visit: Payer: 59 | Admitting: Internal Medicine

## 2023-01-21 VITALS — BP 122/84 | HR 95 | Temp 98.2°F | Resp 18 | Ht 70.0 in | Wt 198.2 lb

## 2023-01-21 DIAGNOSIS — G8929 Other chronic pain: Secondary | ICD-10-CM

## 2023-01-21 DIAGNOSIS — E1169 Type 2 diabetes mellitus with other specified complication: Secondary | ICD-10-CM | POA: Diagnosis not present

## 2023-01-21 DIAGNOSIS — H6123 Impacted cerumen, bilateral: Secondary | ICD-10-CM

## 2023-01-21 DIAGNOSIS — Z720 Tobacco use: Secondary | ICD-10-CM

## 2023-01-21 DIAGNOSIS — M5441 Lumbago with sciatica, right side: Secondary | ICD-10-CM

## 2023-01-21 DIAGNOSIS — H6122 Impacted cerumen, left ear: Secondary | ICD-10-CM | POA: Diagnosis not present

## 2023-01-21 DIAGNOSIS — E785 Hyperlipidemia, unspecified: Secondary | ICD-10-CM

## 2023-01-21 DIAGNOSIS — E782 Mixed hyperlipidemia: Secondary | ICD-10-CM | POA: Diagnosis not present

## 2023-01-21 MED ORDER — CARBAMIDE PEROXIDE 6.5 % OT SOLN: 5.0000 [drp] | Freq: Two times a day (BID) | OTIC | 2 refills | Status: DC

## 2023-01-21 NOTE — Assessment & Plan Note (Signed)
He is clear for back surgery from medical point of view

## 2023-01-21 NOTE — Assessment & Plan Note (Signed)
I could not remove all the wax, so he will put debrox ear drops to left ear twice a day and may come next week for ear irrigation.

## 2023-01-21 NOTE — Assessment & Plan Note (Signed)
controlled 

## 2023-01-21 NOTE — Progress Notes (Signed)
Office Visit  Subjective   Patient ID: Matthew Horton   DOB: Sep 22, 1965   Age: 57 y.o.   MRN: [id number]   Chief Complaint Chief Complaint  Patient presents with   Office vist    Surgical clearance, and possible ear clogged.     History of Present Illness 57 years old male is here for surgical clearance for back surgery. He says his back pain never resolve from previous surgery, they are going to do fusion and hopefully that will fix his problem.   He has CAD with stent placed, he already got clearance from his cardiologist. No exertional chest pain and SOB.   He has diabetes mellitus and his HbA1c was 7.0% that is acceptable. I have reviewed his labs with him, his LDL is well controlled. His microalbuminuria was negative.   He also says that his left ear is blocked and he has to read the lips. He has wax removed before.   Past Medical History Past Medical History:  Diagnosis Date   Atrial fibrillation (HCC)    a. Transient during adm 03/2013 for PCI.    CKD (chronic kidney disease) stage 2, GFR 60-89 ml/min    Cluster headache    Coronary artery disease    a. s/p DES x 2 to RCA 03/2013 - residual disease for med rx.   DM2 (diabetes mellitus, type 2) (HCC)    HLD (hyperlipidemia)    HTN (hypertension)    Low testosterone    Nonallopathic lesion of cervical region    OSA (obstructive sleep apnea)    Tobacco abuse    Torticollis, acute      Allergies Allergies  Allergen Reactions   Ranexa [Ranolazine] Nausea Only and Other (See Comments)    dizziness     Review of Systems Review of Systems  Constitutional: Negative.   HENT:  Positive for hearing loss.   Respiratory: Negative.    Cardiovascular: Negative.   Gastrointestinal: Negative.   Musculoskeletal:  Positive for back pain.  Neurological: Negative.        Objective:    Vitals BP 122/84 (BP Location: Left Arm, Patient Position: Sitting, Cuff Size: Normal)   Pulse 95   Temp 98.2 F (36.8 C)   Resp  18   Ht 5\' 10"  (1.778 m)   Wt 198 lb 4 oz (89.9 kg)   SpO2 98%   BMI 28.45 kg/m    Physical Examination Physical Exam Constitutional:      Appearance: Normal appearance.  HENT:     Head: Normocephalic and atraumatic.  Cardiovascular:     Rate and Rhythm: Normal rate and regular rhythm.     Heart sounds: Normal heart sounds.  Pulmonary:     Effort: Pulmonary effort is normal.     Breath sounds: Normal breath sounds.  Abdominal:     General: Bowel sounds are normal.     Palpations: Abdomen is soft.  Neurological:     General: No focal deficit present.     Mental Status: He is alert and oriented to person, place, and time.        Assessment & Plan:   Tobacco abuse Quit 14 years ago  Hyperlipidemia controlled  Back pain He is clear for back surgery from medical point of view  Dyslipidemia associated with type 2 diabetes mellitus (HCC) controlled  He is clear for back surgery from medical point of view.  Return in about 3 months (around 04/23/2023).   [name], MD

## 2023-01-21 NOTE — Assessment & Plan Note (Signed)
Quit 14 years ago

## 2023-01-27 DIAGNOSIS — I4891 Unspecified atrial fibrillation: Secondary | ICD-10-CM | POA: Diagnosis not present

## 2023-01-27 DIAGNOSIS — Z09 Encounter for follow-up examination after completed treatment for conditions other than malignant neoplasm: Secondary | ICD-10-CM | POA: Diagnosis not present

## 2023-01-27 DIAGNOSIS — R1013 Epigastric pain: Secondary | ICD-10-CM | POA: Diagnosis not present

## 2023-01-27 DIAGNOSIS — Z1211 Encounter for screening for malignant neoplasm of colon: Secondary | ICD-10-CM | POA: Diagnosis not present

## 2023-01-27 DIAGNOSIS — K2950 Unspecified chronic gastritis without bleeding: Secondary | ICD-10-CM | POA: Diagnosis not present

## 2023-01-27 DIAGNOSIS — K635 Polyp of colon: Secondary | ICD-10-CM | POA: Diagnosis not present

## 2023-01-27 DIAGNOSIS — Z86010 Personal history of colonic polyps: Secondary | ICD-10-CM | POA: Diagnosis not present

## 2023-01-27 DIAGNOSIS — D123 Benign neoplasm of transverse colon: Secondary | ICD-10-CM | POA: Diagnosis not present

## 2023-01-28 ENCOUNTER — Other Ambulatory Visit: Payer: Self-pay | Admitting: Internal Medicine

## 2023-01-28 DIAGNOSIS — M5136 Other intervertebral disc degeneration, lumbar region: Secondary | ICD-10-CM | POA: Diagnosis not present

## 2023-01-28 DIAGNOSIS — M47816 Spondylosis without myelopathy or radiculopathy, lumbar region: Secondary | ICD-10-CM | POA: Diagnosis not present

## 2023-02-08 DIAGNOSIS — M5136 Other intervertebral disc degeneration, lumbar region: Secondary | ICD-10-CM | POA: Diagnosis not present

## 2023-02-08 DIAGNOSIS — M47816 Spondylosis without myelopathy or radiculopathy, lumbar region: Secondary | ICD-10-CM | POA: Diagnosis not present

## 2023-02-09 ENCOUNTER — Encounter: Payer: 59 | Admitting: Internal Medicine

## 2023-02-10 DIAGNOSIS — M47816 Spondylosis without myelopathy or radiculopathy, lumbar region: Secondary | ICD-10-CM | POA: Diagnosis not present

## 2023-02-10 DIAGNOSIS — M5136 Other intervertebral disc degeneration, lumbar region: Secondary | ICD-10-CM | POA: Diagnosis not present

## 2023-02-11 DIAGNOSIS — Z7984 Long term (current) use of oral hypoglycemic drugs: Secondary | ICD-10-CM | POA: Diagnosis not present

## 2023-02-11 DIAGNOSIS — Z87891 Personal history of nicotine dependence: Secondary | ICD-10-CM | POA: Diagnosis not present

## 2023-02-11 DIAGNOSIS — I10 Essential (primary) hypertension: Secondary | ICD-10-CM | POA: Diagnosis not present

## 2023-02-11 DIAGNOSIS — M5416 Radiculopathy, lumbar region: Secondary | ICD-10-CM | POA: Diagnosis not present

## 2023-02-11 DIAGNOSIS — J45909 Unspecified asthma, uncomplicated: Secondary | ICD-10-CM | POA: Diagnosis not present

## 2023-02-11 DIAGNOSIS — E119 Type 2 diabetes mellitus without complications: Secondary | ICD-10-CM | POA: Diagnosis not present

## 2023-02-11 DIAGNOSIS — Z79899 Other long term (current) drug therapy: Secondary | ICD-10-CM | POA: Diagnosis not present

## 2023-02-11 DIAGNOSIS — Z01818 Encounter for other preprocedural examination: Secondary | ICD-10-CM | POA: Diagnosis not present

## 2023-02-11 DIAGNOSIS — M5116 Intervertebral disc disorders with radiculopathy, lumbar region: Secondary | ICD-10-CM | POA: Diagnosis not present

## 2023-02-11 DIAGNOSIS — I4891 Unspecified atrial fibrillation: Secondary | ICD-10-CM | POA: Diagnosis not present

## 2023-02-11 DIAGNOSIS — G4733 Obstructive sleep apnea (adult) (pediatric): Secondary | ICD-10-CM | POA: Diagnosis not present

## 2023-02-11 DIAGNOSIS — Z01812 Encounter for preprocedural laboratory examination: Secondary | ICD-10-CM | POA: Diagnosis not present

## 2023-02-11 DIAGNOSIS — I2510 Atherosclerotic heart disease of native coronary artery without angina pectoris: Secondary | ICD-10-CM | POA: Diagnosis not present

## 2023-02-26 DIAGNOSIS — M4726 Other spondylosis with radiculopathy, lumbar region: Secondary | ICD-10-CM | POA: Diagnosis not present

## 2023-02-26 DIAGNOSIS — N200 Calculus of kidney: Secondary | ICD-10-CM | POA: Diagnosis not present

## 2023-02-26 DIAGNOSIS — Z7984 Long term (current) use of oral hypoglycemic drugs: Secondary | ICD-10-CM | POA: Diagnosis not present

## 2023-02-26 DIAGNOSIS — M2578 Osteophyte, vertebrae: Secondary | ICD-10-CM | POA: Diagnosis not present

## 2023-02-26 DIAGNOSIS — E119 Type 2 diabetes mellitus without complications: Secondary | ICD-10-CM | POA: Diagnosis not present

## 2023-02-26 DIAGNOSIS — Z7902 Long term (current) use of antithrombotics/antiplatelets: Secondary | ICD-10-CM | POA: Diagnosis not present

## 2023-02-26 DIAGNOSIS — Z79899 Other long term (current) drug therapy: Secondary | ICD-10-CM | POA: Diagnosis not present

## 2023-02-26 DIAGNOSIS — M48061 Spinal stenosis, lumbar region without neurogenic claudication: Secondary | ICD-10-CM | POA: Diagnosis not present

## 2023-02-26 DIAGNOSIS — M47816 Spondylosis without myelopathy or radiculopathy, lumbar region: Secondary | ICD-10-CM | POA: Diagnosis not present

## 2023-02-26 DIAGNOSIS — M4807 Spinal stenosis, lumbosacral region: Secondary | ICD-10-CM | POA: Diagnosis not present

## 2023-02-26 DIAGNOSIS — E785 Hyperlipidemia, unspecified: Secondary | ICD-10-CM | POA: Diagnosis not present

## 2023-02-26 DIAGNOSIS — K219 Gastro-esophageal reflux disease without esophagitis: Secondary | ICD-10-CM | POA: Diagnosis not present

## 2023-02-26 DIAGNOSIS — I2510 Atherosclerotic heart disease of native coronary artery without angina pectoris: Secondary | ICD-10-CM | POA: Diagnosis not present

## 2023-02-26 DIAGNOSIS — I4891 Unspecified atrial fibrillation: Secondary | ICD-10-CM | POA: Diagnosis not present

## 2023-02-26 DIAGNOSIS — M5136 Other intervertebral disc degeneration, lumbar region: Secondary | ICD-10-CM | POA: Diagnosis not present

## 2023-02-26 DIAGNOSIS — M4727 Other spondylosis with radiculopathy, lumbosacral region: Secondary | ICD-10-CM | POA: Diagnosis not present

## 2023-02-26 DIAGNOSIS — I10 Essential (primary) hypertension: Secondary | ICD-10-CM | POA: Diagnosis not present

## 2023-02-26 DIAGNOSIS — J45909 Unspecified asthma, uncomplicated: Secondary | ICD-10-CM | POA: Diagnosis not present

## 2023-02-26 DIAGNOSIS — M48062 Spinal stenosis, lumbar region with neurogenic claudication: Secondary | ICD-10-CM | POA: Diagnosis not present

## 2023-03-11 ENCOUNTER — Ambulatory Visit: Payer: 59 | Admitting: Internal Medicine

## 2023-03-11 VITALS — BP 118/60 | HR 77 | Temp 97.8°F | Ht 70.0 in | Wt 198.2 lb

## 2023-03-11 DIAGNOSIS — E785 Hyperlipidemia, unspecified: Secondary | ICD-10-CM

## 2023-03-11 DIAGNOSIS — I10 Essential (primary) hypertension: Secondary | ICD-10-CM

## 2023-03-11 DIAGNOSIS — M5441 Lumbago with sciatica, right side: Secondary | ICD-10-CM

## 2023-03-11 DIAGNOSIS — E1169 Type 2 diabetes mellitus with other specified complication: Secondary | ICD-10-CM | POA: Diagnosis not present

## 2023-03-11 DIAGNOSIS — G8929 Other chronic pain: Secondary | ICD-10-CM

## 2023-03-11 MED ORDER — GLIMEPIRIDE 2 MG PO TABS: 2.0000 mg | ORAL_TABLET | Freq: Every day | ORAL | 6 refills | Status: DC

## 2023-03-11 NOTE — Assessment & Plan Note (Signed)
Controlled with current medication.

## 2023-03-11 NOTE — Progress Notes (Signed)
Office Visit  Subjective   Patient ID: Matthew Horton   DOB: 22-Apr-1966   Age: 57 y.o.   MRN: [id number]   Chief Complaint Chief Complaint  Patient presents with   Follow-up    Hospital follow up     History of Present Illness 57 years old male who was admitted to hospital on September 13 and discharged on September 15 after he has back surgery.  Surgery was uneventful.  He has spinal lumbar stenosis with lumbar radiculopathy and degenerative disc disease.  He was allowed not to lift heavy stuff and not to sit longer than 30 minutes.  He has an appointment for follow-up with surgeon first week of October.  He still hurt and it is difficult for him to walk.  He is asking for handicap sticker until he recovered. He was given steroid and his blood sugar has been 400 range but it is getting better.  This morning fasting blood sugar was 200.  He says that he is eating the same food.  He was given insulin in the hospital. He was cleared by cardiologist prior to surgery.  He has a artery disease and hyperlipidemia.  He restarted taking his aspirin and Plavix.  His LDL is controlled.  I have reviewed hospital record and discharge medications.  Past Medical History Past Medical History:  Diagnosis Date   Atrial fibrillation (HCC)    a. Transient during adm 03/2013 for PCI.    CKD (chronic kidney disease) stage 2, GFR 60-89 ml/min    Cluster headache    Coronary artery disease    a. s/p DES x 2 to RCA 03/2013 - residual disease for med rx.   DM2 (diabetes mellitus, type 2) (HCC)    HLD (hyperlipidemia)    HTN (hypertension)    Low testosterone    Nonallopathic lesion of cervical region    OSA (obstructive sleep apnea)    Tobacco abuse    Torticollis, acute      Allergies Allergies  Allergen Reactions   Ranexa [Ranolazine] Nausea Only and Other (See Comments)    dizziness     Review of Systems Review of Systems  Constitutional: Negative.   HENT: Negative.    Respiratory:  Negative.    Cardiovascular: Negative.   Gastrointestinal: Negative.   Musculoskeletal:  Positive for back pain.  Neurological: Negative.        Objective:    Vitals BP 118/60 (BP Location: Left Arm, Patient Position: Sitting, Cuff Size: Normal)   Pulse 77   Temp 97.8 F (36.6 C)   Ht 5\' 10"  (1.778 m)   Wt 198 lb 4 oz (89.9 kg)   BMI 28.45 kg/m    Physical Examination Physical Exam Constitutional:      Appearance: Normal appearance.  HENT:     Head: Normocephalic and atraumatic.  Cardiovascular:     Rate and Rhythm: Normal rate and regular rhythm.     Heart sounds: Normal heart sounds.  Pulmonary:     Effort: Pulmonary effort is normal.     Breath sounds: Normal breath sounds.  Abdominal:     General: Bowel sounds are normal.  Skin:    Comments: Car to lower back and abdomen is healing nicely.  There is no stitch is present.  Her wound is healing well.  Neurological:     General: No focal deficit present.     Mental Status: He is alert.        Assessment & Plan:  Essential hypertension Controlled with current medication.  Dyslipidemia associated with type 2 diabetes mellitus (HCC) His lipid was well-controlled and his hemoglobin A1c was 7.0.  But because of steroid his blood sugar has been high.  I will give him glimepiride 2 mg daily and once blood sugar is back to normal then he can stop that.  He will see eye doctor.  Back pain Status post lumbar fusion.  He is recovering and hopefully his pain will be a lot better.  He will follow the instruction and follow-up with spine surgeon.  This is a Is a transition of care visit.  I have reviewed his medication with him.  Return in about 3 months (around 06/10/2023).   Matthew Northern, MD

## 2023-03-11 NOTE — Assessment & Plan Note (Signed)
Status post lumbar fusion.  He is recovering and hopefully his pain will be a lot better.  He will follow the instruction and follow-up with spine surgeon.

## 2023-03-11 NOTE — Assessment & Plan Note (Signed)
His lipid was well-controlled and his hemoglobin A1c was 7.0.  But because of steroid his blood sugar has been high.  I will give him glimepiride 2 mg daily and once blood sugar is back to normal then he can stop that.  He will see eye doctor.

## 2023-03-11 NOTE — Patient Instructions (Signed)
He will stop taking glimepiride once his blood sugar is better.  He will also see eye doctor.  I have given him temporary handicap sticker.

## 2023-03-12 ENCOUNTER — Other Ambulatory Visit: Payer: Self-pay | Admitting: Internal Medicine

## 2023-03-17 ENCOUNTER — Encounter: Payer: Self-pay | Admitting: Internal Medicine

## 2023-03-17 ENCOUNTER — Ambulatory Visit: Payer: BC Managed Care – PPO | Attending: Internal Medicine | Admitting: Internal Medicine

## 2023-03-17 VITALS — BP 114/60 | HR 88 | Ht 70.0 in | Wt 198.0 lb

## 2023-03-17 DIAGNOSIS — E119 Type 2 diabetes mellitus without complications: Secondary | ICD-10-CM

## 2023-03-17 DIAGNOSIS — I10 Essential (primary) hypertension: Secondary | ICD-10-CM

## 2023-03-17 DIAGNOSIS — I2510 Atherosclerotic heart disease of native coronary artery without angina pectoris: Secondary | ICD-10-CM | POA: Diagnosis not present

## 2023-03-17 DIAGNOSIS — E785 Hyperlipidemia, unspecified: Secondary | ICD-10-CM | POA: Diagnosis not present

## 2023-03-17 NOTE — Progress Notes (Signed)
OFFICE NOTE  Chief Complaint:  Right leg pain and numbness  Primary Care Physician: [name], MD  HPI:  Matthew Horton is a 57 y.o. male with PMH of HTN, DM-2, HLD, OSA & long-standing tobacco abuse with known moderate coronary disease by catheterization in March of 2014 for an abnormal stress test. At that time he shown to have moderate two-vessel disease in the diagonal branch of the LAD as well as the RCA. There was also an ostial OM1 lesion. An FFR of the RCA 70% lesion wasn't not significant. Medical therapy was recommended, The patient continued to have intermittent episodes of chest discomfort since that time. Despite being on aggressive therapy, he presented again on the morning of March 21, 2013 with substernal chest pain like a brick sitting on his chest. This was relieved by nitroglycerin. His EKG demonstrated no acute changes. He was admitted and ruled out for MI with negative troponins x 3. However, considering his history and high likelihood for progression of disease, a relook cath was recommended. This was performed by Dr. Herbie Baltimore, via the right radial artery. He was found to have severe 2 site CAD in the mid RCA and mid PDA, demonstrating significant progression of disease since March of 2014. Both lesions were treated with DES. He had normal LVEF and EDP. He tolerated the procedure well but was noted to have post PCI atrial fibrillation with RVR. He left the cath lab in stable condition. He had no further A-fib on telemetry. He remained CP free. The right radial access site remained stable. His labs were stable. He did have mild hypertension and was started on a low dose ACE-I. He was resumed on his BB and statin. He was placed on DAPT with ASA and Brilinta.  He returns today in follow-up and is feeling well. His radial cath site has a good pulse and there is good distal sensation and perfusion. He has had no further chest pain and no significant DOE.  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  He reports that he has stopped smoking with this recent hospitalization.  Matthew Horton was recently hospitalized again for chest pain, he underwent repeat cardiac catheterization which showed the folllowing:  Normal LV function   Two-vessel cardiac disease with previously noted 7080% stenosis in the diagonal branch of the LAD the very small superior branch of this diagonal vessel with 95% ostial stenosis and evidence for 30% proximal LAD narrowing as well as 80% mid LAD intramyocardial segment with mid systolic bridging which did improve following IC nitroglycerin administration; 30% narrowing in the distal circumflex marginal vessel; and widely patent mid and distal RCA stents with distal inferior LV branch diffuse stenoses/spasm and a small distal vessel   Previously, the patient has been demonstrated to have diagonal stenoses with normal FFR. The present diagonal stenosis appears 80% but visually is the same as previously. He also has evidence for mid systolic bridging of the mid LAD and spasm/diffuse stenoses of an inferior distal branch of his RCA. The previously placed RCA stents are widely patent. The patient will be treated with more aggressive medical regimen including nitrates as well as amlodipine. Smoking cessation is imperative. Consideration for nuclear imaging to assess for anterolateral ischemia may be helpful if recurrent symptomatology develops.  He followed up in our office postcardiac catheterization and reported some persistent sharp electric quick chest pains in the upper midepigastric area. He was started on  low-dose amlodipine for possible coronary spasm and he reported initially his symptoms improved significantly however recently they picked up again. His EKG shows no acute changes. He has not taken nitroglycerin because it "makes him feel bad".   I saw Matthew Horton back in the office today. He reports in March of  this year he went to see another cardiologist, Dr. Hedy Jacob at [facility], for a second opinion regarding persistent chest discomfort. Dr. Hedy Jacob felt that this was atypical for coronary artery disease and ordered a treadmill stress test. This was abnormal which led to a stress echocardiogram. This also was interpreted as mildly abnormal and ultimately he had a repeat cardiac catheterization. Per Mr. Bantz's report this was negative for any new obstructive coronary disease. He was placed then on omeprazole and his symptoms improved almost immediately. I've surged on care everywhere for the cath report however cannot seem to find it. We will request those records from [facility].  03/06/2016  Matthew Horton returns today for follow-up. In January he saw [name], PA-C, who had evaluated him for persistent chest discomfort. The symptoms are somewhat atypical and more consistent with reflux. However he was restarted on Protonix and given isosorbide. He said he took the medication but felt "funny in the head", and then it was discontinued. He reports being compliant with pen Toprol resolved. He still gets episodes chest discomfort. He reports his blood sugars are fairly well controlled with an A1c of 6.1 that is unchanged despite more than 20 pound weight loss. He is currently at 200 pounds with a BMI of 28. Blood pressures on the low normal side at 106/70. EKG today shows sinus rhythm with no ischemia.  03/11/2017  Matthew Horton was seen today in follow-up. He had had no issues over the past year. He denies chest pain, dyspnea or any palpitations. He says he has some pain in his right neck - it feels like a pulsation. Not worse when turning his neck - it is sharp and comes and goes. BP appears controlled today. He has not had a recent lipid profile in our system, but had an LDL-C of 105 in 2015. His weight is up some again and we discussed the importance of weight loss. 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 He says he will need a stress test to accomplish this.  03/10/2018  Matthew Horton was seen today in follow-up.  Is been a year since I last saw him.  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  He underwent stress testing last year which was negative for ischemia.  He says he will need repeat testing every other year.  He has had minimal weight loss.  His diet is less than optimal and higher in saturated fats.  Recent LDL cholesterol in May was 91 with goal LDL less than 70.  He also reports this continued globus sensation in the neck.  Carotid Dopplers were negative.  He has some upper abdominal tightness which he feels like it is in his chest, however he points to his upper abdomen.  He says this happens mostly in the mornings and may represent reflux.  He was previously on Protonix however due to the cost of the medicine he stopped taking it.  He said it was the only medicine that "work for him".  03/30/2019  Matthew Horton returns today for follow-up.  He reports some intermittent chest discomfort.  He says it feels like sharp pains that occur  at rest or could be with exertion.  He is not managed to lose much weight.  He is try to cut back his eating but not change the quality of his foods.  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  He also has intermittent reflux symptoms and reports compliance with Protonix.  Blood pressure was elevated today however he did not take his medicines this morning.  03/29/2020  Matthew Horton returns for routine follow-up.  He says he has been off this week as his wife had cataract surgery.  He continues to require DOT driving license.  He had stress testing last year which was negative for ischemia but showed a mildly reduced LVEF of 48%.  A repeat echo then showed low normal LVEF of 50 to 55%.  He gets occasional shortness of breath with some wheezing.  He says it gets better with an inhaler and is  requesting a refill of his albuterol.  I suspect he might have some COPD however he says it is asthma.  He was a smoker.  I encouraged him to follow-up with his PCP to get pulmonary function testing.  Blood pressure is normal today.  He denies any chest pain.  EKG shows normal sinus rhythm.  His last LDL was 108 in 2020.  He did say last year that his hemoglobin A1c went above 10 but then subsequently he worked to lower his blood sugar with weight loss and had come down about 20 pounds.  A1c as of April of this year was 6.6.  03/17/2023  Matthew Horton is seen today for follow-up.  He had recent back surgery from an anterior approach about 2 weeks ago.  He seems to be healing well and I reviewed his wounds today at his request.  They are granulating well.  No evidence of any discharge.  His blood pressure is well-controlled.  Denies any chest pain or shortness of breath.  He is lipids are well-controlled with LDL of 58.  He had last had PCI around 2014.  He has subsequently been on dual antiplatelet therapy since then.  PMHx:  Past Medical History:  Diagnosis Date   Atrial fibrillation (HCC)    a. Transient during adm 03/2013 for PCI.    CKD (chronic kidney disease) stage 2, GFR 60-89 ml/min    Cluster headache    Coronary artery disease    a. s/p DES x 2 to RCA 03/2013 - residual disease for med rx.   DM2 (diabetes mellitus, type 2) (HCC)    HLD (hyperlipidemia)    HTN (hypertension)    Low testosterone    Nonallopathic lesion of cervical region    OSA (obstructive sleep apnea)    Tobacco abuse    Torticollis, acute     Past Surgical History:  Procedure Laterality Date   CARDIAC CATHETERIZATION  08/08/2013   CORONARY ANGIOPLASTY WITH STENT PLACEMENT  03/22/2013   "2 stents", Dr. Ranae Palms   FRACTIONAL FLOW RESERVE WIRE Right 03/22/2013   Procedure: FRACTIONAL FLOW RESERVE WIRE;  Surgeon: Marykay Lex, MD;  Location: [facility] CATH LAB;  Service: Cardiovascular;  Laterality: Right;   LEFT HEART  CATHETERIZATION WITH CORONARY ANGIOGRAM N/A 08/16/2012   Procedure: LEFT HEART CATHETERIZATION WITH CORONARY ANGIOGRAM;  Surgeon: Runell Gess, MD;  Location: [facility] CATH LAB;  Service: Cardiovascular;  Laterality: N/A;   LEFT HEART CATHETERIZATION WITH CORONARY ANGIOGRAM N/A 03/22/2013   Procedure: LEFT HEART CATHETERIZATION WITH  CORONARY ANGIOGRAM;  Surgeon: Marykay Lex, MD;  Location: [facility] CATH LAB;  Service: Cardiovascular;  Laterality: N/A;   LEFT HEART CATHETERIZATION WITH CORONARY ANGIOGRAM N/A 08/08/2013   Procedure: LEFT HEART CATHETERIZATION WITH CORONARY ANGIOGRAM;  Surgeon: Lennette Bihari, MD;  Location: [facility] CATH LAB;  Service: Cardiovascular;  Laterality: N/A;   PERCUTANEOUS CORONARY STENT INTERVENTION (PCI-S) Right 03/22/2013   Procedure: PERCUTANEOUS CORONARY STENT INTERVENTION (PCI-S);  Surgeon: Marykay Lex, MD;  Location[facility] CATH LAB;  Service: Cardiovascular;  Laterality: Right;   TONSILLECTOMY     TRAUMATIC AMPUTATION AND REATTACHMENT Left    Index finger    FAMHx:  Family History  Problem Relation Age of Onset   Coronary artery disease Father 68       MI and s/p CABG   Diabetes Father    Hypertension Father    Hyperlipidemia Father    Hyperlipidemia Mother     SOCHx:   reports that he quit smoking about 9 years ago. His smoking use included cigarettes. He started smoking about 39 years ago. He has a 30 pack-year smoking history. He has never used smokeless tobacco. He reports that he does not drink alcohol and does not use drugs.  ALLERGIES:  Allergies  Allergen Reactions   Ranexa [Ranolazine] Nausea Only and Other (See Comments)    dizziness    ROS: Pertinent items noted in HPI and remainder of comprehensive ROS otherwise negative.  HOME MEDS: Current Outpatient Medications  Medication Sig Dispense Refill   acetaminophen (TYLENOL) 325 MG tablet Take 1-2 tablets (325-650 mg total) by mouth every 4 (four) hours as needed for mild pain or moderate pain.      albuterol (PROAIR HFA) 108 (90 Base) MCG/ACT inhaler Inhale 1-2 puffs into the lungs every 6 (six) hours as needed for wheezing or shortness of breath. 1 each 0   amLODipine (NORVASC) 5 MG tablet TAKE 1 TABLET DAILY. 90 tablet 3   aspirin EC 81 MG tablet Take 81 mg by mouth every morning.      atorvastatin (LIPITOR) 40 MG tablet Take 1 tablet (40 mg total) by mouth daily at 6 PM. 90 tablet 3   clopidogrel (PLAVIX) 75 MG tablet Take 1 tablet (75 mg total) by mouth daily. 90 tablet 3   ezetimibe (ZETIA) 10 MG tablet Take 1 tablet (10 mg total) by mouth daily. 90 tablet 3   glimepiride (AMARYL) 2 MG tablet Take 1 tablet (2 mg total) by mouth daily with breakfast. 30 tablet 6   lisinopril (ZESTRIL) 10 MG tablet TAKE 1 TABLET BY MOUTH EVERY DAY 90 tablet 2   methocarbamol (ROBAXIN) 500 MG tablet Take 500 mg by mouth 2 (two) times daily.     metoprolol tartrate (LOPRESSOR) 50 MG tablet Take 1 tablet (50 mg total) by mouth 2 (two) times daily. 180 tablet 3   montelukast (SINGULAIR) 10 MG tablet TAKE 1 TABLET BY MOUTH EVERY DAY 90 tablet 0   nitroGLYCERIN (NITROSTAT) 0.4 MG SL tablet Place 1 tablet (0.4 mg total) under the tongue every 5 (five) minutes as needed for chest pain. 25 tablet 2   pantoprazole (PROTONIX) 40 MG tablet Take 1 tablet (40 mg total) by mouth daily. 90 tablet 3   SILDENAFIL CITRATE PO Take 30 mg by mouth as needed.     SitaGLIPtin-MetFORMIN HCl (JANUMET XR) 50-1000 MG TB24 Take 2 tablets by mouth daily. 180 tablet 4   fluticasone (FLONASE) 50 MCG/ACT nasal spray Place 1 spray into both nostrils 3 (  three) times daily as needed for allergies or rhinitis. (Patient not taking: Reported on 03/17/2023) 15.8 mL 1   No current facility-administered medications for this visit.    LABS/IMAGING: No results found for this or any previous visit (from the past 48 hour(s)). No results found.  VITALS: BP 114/60 (BP Location: Left Arm, Patient Position: Sitting, Cuff Size: Normal)   Pulse 88    Ht 5\' 10"  (1.778 m)   Wt 198 lb (89.8 kg)   SpO2 96%   BMI 28.41 kg/m   EXAM: General appearance: alert and no distress Neck: no carotid bruit, no JVD and thyroid not enlarged, symmetric, no tenderness/mass/nodules Lungs: clear to auscultation bilaterally Heart: regular rate and rhythm, S1, S2 normal, no murmur, click, rub or gallop Abdomen: soft, non-tender; bowel sounds normal; no masses,  no organomegaly Extremities: extremities normal, atraumatic, no cyanosis or edema Pulses: 2+ and symmetric Skin: Skin color, texture, turgor normal. No rashes or lesions Neurologic: Grossly normal Psych: Pleasant  EKG: deferred  ASSESSMENT: CAD s/p PCI to the mid-RCA and distal PDA with DES (03/2013) -  patent stents by cardiac catheterization at [facility] in 08/2014 HTN - controlled Hyperlipidemia Former smoker with ?asthma, suspect COPD GERD  DM2- A1c 6.6  PLAN: 1.   Matthew Horton seems to be doing well although is recovering from recent anterior approach spinal surgery.  The wounds appear to be healing well.  He has had some lower extremity numbness and tingling.  Blood pressure is well-controlled.  His lipids are at goal.  He denies any chest pain.  I think that recent literature shows that dual antiplatelet therapy long-term is associated with much higher risk of bleeding complication and I would favor stopping his Plavix and continuing low-dose aspirin.  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  Follow-up with me annually or sooner as necessary.  Chrystie Nose, MD, [facility], FACP  [place]  [facility]  Medical Director of the Advanced Lipid Disorders &  Cardiovascular Risk Reduction Clinic Diplomate of the American Board of Clinical Lipidology Attending Cardiologist  Direct Dial: [phone]  Fax: [phone]  Website:  [email or web address] Nicely Leilani Cespedes 03/17/2023, 3:10 PM

## 2023-03-17 NOTE — Patient Instructions (Signed)
Medication Instructions:  STOP clopidogrel (plavix)   *If you need a refill on your cardiac medications before your next appointment, please call your pharmacy*   Follow-Up: At [facility], you and your health needs are our priority.  As part of our continuing mission to provide you with exceptional heart care, we have created designated Provider Care Teams.  These Care Teams include your primary Cardiologist (physician) and Advanced Practice Providers (APPs -  Physician Assistants and Nurse Practitioners) who all work together to provide you with the care you need, when you need it.  We recommend signing up for the patient portal called "MyChart".  Sign up information is provided on this After Visit Summary.  MyChart is used to connect with patients for Virtual Visits (Telemedicine).  Patients are able to view lab/test results, encounter notes, upcoming appointments, etc.  Non-urgent messages can be sent to your provider as well.   To learn more about what you can do with MyChart, go to [email or web address].    Your next appointment:   12 months with Dr. Rennis Golden

## 2023-03-28 ENCOUNTER — Other Ambulatory Visit: Payer: Self-pay | Admitting: Internal Medicine

## 2023-03-30 DIAGNOSIS — M5441 Lumbago with sciatica, right side: Secondary | ICD-10-CM

## 2023-03-30 DIAGNOSIS — E785 Hyperlipidemia, unspecified: Secondary | ICD-10-CM

## 2023-03-30 DIAGNOSIS — G8929 Other chronic pain: Secondary | ICD-10-CM

## 2023-03-30 DIAGNOSIS — I10 Essential (primary) hypertension: Secondary | ICD-10-CM | POA: Diagnosis not present

## 2023-03-30 DIAGNOSIS — E1169 Type 2 diabetes mellitus with other specified complication: Secondary | ICD-10-CM | POA: Diagnosis not present

## 2023-04-20 DIAGNOSIS — Z981 Arthrodesis status: Secondary | ICD-10-CM | POA: Diagnosis not present

## 2023-04-20 DIAGNOSIS — M51362 Other intervertebral disc degeneration, lumbar region with discogenic back pain and lower extremity pain: Secondary | ICD-10-CM | POA: Diagnosis not present

## 2023-04-20 DIAGNOSIS — M4186 Other forms of scoliosis, lumbar region: Secondary | ICD-10-CM | POA: Diagnosis not present

## 2023-04-22 ENCOUNTER — Ambulatory Visit: Payer: 59 | Admitting: Internal Medicine

## 2023-05-14 ENCOUNTER — Other Ambulatory Visit: Payer: Self-pay | Admitting: Internal Medicine

## 2023-05-19 DIAGNOSIS — E119 Type 2 diabetes mellitus without complications: Secondary | ICD-10-CM | POA: Diagnosis not present

## 2023-05-26 ENCOUNTER — Ambulatory Visit: Payer: 59 | Admitting: Internal Medicine

## 2023-05-26 ENCOUNTER — Encounter: Payer: Self-pay | Admitting: Internal Medicine

## 2023-05-26 VITALS — BP 122/80 | HR 94 | Temp 98.4°F | Resp 18 | Ht 70.0 in | Wt 205.0 lb

## 2023-05-26 DIAGNOSIS — R509 Fever, unspecified: Secondary | ICD-10-CM | POA: Diagnosis not present

## 2023-05-26 DIAGNOSIS — J069 Acute upper respiratory infection, unspecified: Secondary | ICD-10-CM | POA: Diagnosis not present

## 2023-05-26 DIAGNOSIS — R059 Cough, unspecified: Secondary | ICD-10-CM | POA: Diagnosis not present

## 2023-05-26 DIAGNOSIS — J029 Acute pharyngitis, unspecified: Secondary | ICD-10-CM | POA: Diagnosis not present

## 2023-05-26 LAB — POC COVID19 BINAXNOW: SARS Coronavirus 2 Ag: NEGATIVE

## 2023-05-26 LAB — POCT RAPID STREP A (OFFICE): Rapid Strep A Screen: NEGATIVE

## 2023-05-26 MED ORDER — METHYLPREDNISOLONE SODIUM SUCC 40 MG IJ SOLR
80.0000 mg | Freq: Once | INTRAMUSCULAR | Status: AC
Start: 2023-05-26 — End: 2023-05-26
  Administered 2023-05-26: 80 mg via INTRAMUSCULAR

## 2023-05-26 MED ORDER — GUAIFENESIN ER 600 MG PO TB12: 600.0000 mg | ORAL_TABLET | Freq: Two times a day (BID) | ORAL | 0 refills | Status: AC

## 2023-05-26 MED ORDER — MONTELUKAST SODIUM 10 MG PO TABS: 10.0000 mg | ORAL_TABLET | Freq: Every day | ORAL | 3 refills | Status: DC

## 2023-05-26 NOTE — Assessment & Plan Note (Signed)
His COVID tests is negative, I will give him solumedrol 80 mg IM X 1, he will use nasal spray and mucinex if not better by Friday then will start on prednisone and antibiotic.

## 2023-05-26 NOTE — Progress Notes (Signed)
   Acute Office Visit  Subjective:     Patient ID: Matthew Horton, male    DOB: Dec 15, 1965, 57 y.o.   MRN: [id number]  Chief Complaint  Patient presents with   Office visit    Fever, sore throat, cough    HPI Patient is in today for cough, cold for 4 days. Initially he has lost taste and smell but it is coming back. He has sore throat today. He has cough and feel at time he feel SOB. No wheezing. He says he also ran out of montelukast as well.    Review of Systems  Constitutional: Negative.   HENT:  Positive for congestion and sore throat.   Respiratory:  Positive for cough.   Cardiovascular: Negative.         Objective:    BP 122/80 (BP Location: Left Arm, Patient Position: Sitting, Cuff Size: Normal)   Pulse 94   Temp 98.4 F (36.9 C)   Resp 18   Ht 5\' 10"  (1.778 m)   Wt 205 lb (93 kg)   SpO2 94%   BMI 29.41 kg/m    Physical Exam HENT:     Head: Atraumatic.  Cardiovascular:     Rate and Rhythm: Normal rate and regular rhythm.     Heart sounds: Normal heart sounds.  Pulmonary:     Effort: Pulmonary effort is normal.     Breath sounds: Normal breath sounds.  Neurological:     Mental Status: He is alert.     Results for orders placed or performed in visit on 05/26/23  POCT rapid strep A  Result Value Ref Range   Rapid Strep A Screen Negative Negative  POC COVID-19 BinaxNow  Result Value Ref Range   SARS Coronavirus 2 Ag Negative Negative        Assessment & Plan:   Problem List Items Addressed This Visit       Respiratory   Viral upper respiratory tract infection    His COVID tests is negative, I will give him solumedrol 80 mg IM X 1, he will use nasal spray and mucinex if not better by Friday then will start on prednisone and antibiotic.      Other Visit Diagnoses     Sore throat    -  Primary   Relevant Orders   POCT rapid strep A (Completed)   POC COVID-19 BinaxNow (Completed)   Cough, unspecified type       Relevant Orders   POC  COVID-19 BinaxNow (Completed)   Fever, unspecified fever cause       Relevant Orders   POC COVID-19 BinaxNow (Completed)       No orders of the defined types were placed in this encounter.   No follow-ups on file.  [name], MD

## 2023-05-28 ENCOUNTER — Other Ambulatory Visit: Payer: Self-pay | Admitting: Student

## 2023-05-28 MED ORDER — METHYLPREDNISOLONE 4 MG PO TBPK: ORAL_TABLET | ORAL | 0 refills | Status: DC

## 2023-05-28 MED ORDER — AMOXICILLIN-POT CLAVULANATE 875-125 MG PO TABS: 1.0000 | ORAL_TABLET | Freq: Two times a day (BID) | ORAL | 0 refills | Status: AC

## 2023-06-02 ENCOUNTER — Other Ambulatory Visit: Payer: Self-pay | Admitting: Internal Medicine

## 2023-06-10 ENCOUNTER — Ambulatory Visit: Payer: 59 | Admitting: Internal Medicine

## 2023-06-10 ENCOUNTER — Encounter: Payer: 59 | Admitting: Internal Medicine

## 2023-06-10 VITALS — BP 126/78 | HR 78 | Temp 97.2°F | Resp 18 | Ht 70.0 in

## 2023-06-10 DIAGNOSIS — J4520 Mild intermittent asthma, uncomplicated: Secondary | ICD-10-CM

## 2023-06-10 DIAGNOSIS — R059 Cough, unspecified: Secondary | ICD-10-CM

## 2023-06-10 DIAGNOSIS — J069 Acute upper respiratory infection, unspecified: Secondary | ICD-10-CM

## 2023-06-10 MED ORDER — GUAIFENESIN-CODEINE 100-10 MG/5ML PO SOLN: 10.0000 mL | Freq: Three times a day (TID) | ORAL | 0 refills | Status: AC | PRN

## 2023-06-10 MED ORDER — AZITHROMYCIN 250 MG PO TABS: ORAL_TABLET | ORAL | 0 refills | Status: AC

## 2023-06-10 MED ORDER — AIRSUPRA 90-80 MCG/ACT IN AERO: 2.0000 | INHALATION_SPRAY | Freq: Four times a day (QID) | RESPIRATORY_TRACT | 6 refills | Status: DC | PRN

## 2023-06-10 NOTE — Progress Notes (Unsigned)
   Acute Office Visit  Subjective:     Patient ID: Matthew Horton, male    DOB: 08/03/1965, 57 y.o.   MRN: [id number]  Chief Complaint  Patient presents with   Follow-up    Sore throat    HPI Patient is in today for   With history of asthma who says that he is still not feeling very good. He still cough   And bring yellowish-colored sputum.  I have seen him 2 weeks ago he says that he got better but then he got worse again.  He does have a history of asthma but not sure if he is wheezing.  Review of Systems  HENT:  Positive for congestion.   Respiratory:  Positive for cough and shortness of breath.   Cardiovascular: Negative.   Neurological: Negative.         Objective:    BP 126/78 (BP Location: Left Arm, Patient Position: Sitting)   Pulse 78   Temp (!) 97.2 F (36.2 C)   Resp 18   Ht 5\' 10"  (1.778 m)   SpO2 98%   BMI 29.41 kg/m  {Vitals History (Optional):23777}  Physical Exam Constitutional:      Appearance: Normal appearance.  Cardiovascular:     Rate and Rhythm: Normal rate and regular rhythm.     Heart sounds: Normal heart sounds.  Pulmonary:     Effort: Pulmonary effort is normal.     Breath sounds: Normal breath sounds.  Neurological:     General: No focal deficit present.     Mental Status: He is alert and oriented to person, place, and time.     No results found for any visits on 06/10/23.      Assessment & Plan:   Problem List Items Addressed This Visit       Respiratory   Viral upper respiratory tract infection     As his symptoms are not getting better I will start him on Z-Pak.  His COVID test is negative.      Relevant Medications   azithromycin (ZITHROMAX Z-PAK) 250 MG tablet   Asthma     As his lungs are clear and he is not wheezing so I will hold steroid but give him airsupra inhalor to see if that helps.      Relevant Medications   Albuterol-Budesonide (AIRSUPRA) 90-80 MCG/ACT AERO     Other   Cough - Primary    Relevant Orders   POC COVID-19 BinaxNow    No orders of the defined types were placed in this encounter.   No follow-ups on file.  [name], MD

## 2023-06-14 DIAGNOSIS — R059 Cough, unspecified: Secondary | ICD-10-CM | POA: Insufficient documentation

## 2023-06-14 NOTE — Assessment & Plan Note (Signed)
As his symptoms are not getting better I will start him on Z-Pak.  His COVID test is negative.

## 2023-06-14 NOTE — Assessment & Plan Note (Signed)
As his lungs are clear and he is not wheezing so I will hold steroid but give him airsupra inhalor to see if that helps.

## 2023-06-27 ENCOUNTER — Other Ambulatory Visit: Payer: Self-pay | Admitting: Internal Medicine

## 2023-06-29 ENCOUNTER — Other Ambulatory Visit: Payer: Self-pay | Admitting: Internal Medicine

## 2023-06-29 DIAGNOSIS — Z1331 Encounter for screening for depression: Secondary | ICD-10-CM | POA: Diagnosis not present

## 2023-06-29 DIAGNOSIS — M47816 Spondylosis without myelopathy or radiculopathy, lumbar region: Secondary | ICD-10-CM | POA: Diagnosis not present

## 2023-06-29 DIAGNOSIS — Z981 Arthrodesis status: Secondary | ICD-10-CM | POA: Diagnosis not present

## 2023-06-29 DIAGNOSIS — M533 Sacrococcygeal disorders, not elsewhere classified: Secondary | ICD-10-CM | POA: Diagnosis not present

## 2023-06-29 DIAGNOSIS — M5136 Other intervertebral disc degeneration, lumbar region with discogenic back pain only: Secondary | ICD-10-CM | POA: Diagnosis not present

## 2023-06-29 MED ORDER — METFORMIN HCL ER 500 MG PO TB24: 1000.0000 mg | ORAL_TABLET | Freq: Every day | ORAL | 11 refills | Status: DC

## 2023-07-01 ENCOUNTER — Other Ambulatory Visit: Payer: Self-pay | Admitting: Internal Medicine

## 2023-07-01 MED ORDER — GLIMEPIRIDE 4 MG PO TABS: 2.0000 mg | ORAL_TABLET | Freq: Every day | ORAL | 3 refills | Status: DC

## 2023-07-07 ENCOUNTER — Ambulatory Visit: Payer: 59 | Admitting: Internal Medicine

## 2023-07-09 ENCOUNTER — Encounter: Payer: Self-pay | Admitting: Internal Medicine

## 2023-07-09 ENCOUNTER — Ambulatory Visit: Payer: 59 | Admitting: Internal Medicine

## 2023-07-09 VITALS — BP 134/82 | HR 95 | Temp 98.1°F | Resp 20 | Ht 70.0 in | Wt 204.1 lb

## 2023-07-09 DIAGNOSIS — E785 Hyperlipidemia, unspecified: Secondary | ICD-10-CM | POA: Diagnosis not present

## 2023-07-09 DIAGNOSIS — E1169 Type 2 diabetes mellitus with other specified complication: Secondary | ICD-10-CM

## 2023-07-09 DIAGNOSIS — I10 Essential (primary) hypertension: Secondary | ICD-10-CM

## 2023-07-09 DIAGNOSIS — J4520 Mild intermittent asthma, uncomplicated: Secondary | ICD-10-CM | POA: Diagnosis not present

## 2023-07-09 MED ORDER — BUDESONIDE-FORMOTEROL FUMARATE 160-4.5 MCG/ACT IN AERO: 2.0000 | INHALATION_SPRAY | Freq: Two times a day (BID) | RESPIRATORY_TRACT | 12 refills | Status: DC

## 2023-07-09 NOTE — Assessment & Plan Note (Signed)
I will do HbA1c today and urine microalbuminuria and then re-evaluate. His LDL is well controlled.

## 2023-07-09 NOTE — Assessment & Plan Note (Signed)
Controlled.

## 2023-07-09 NOTE — Assessment & Plan Note (Signed)
He takes Singulair 10 mg daily and airsupra as needed

## 2023-07-09 NOTE — Progress Notes (Signed)
   Office Visit  Subjective   Patient ID: Matthew Horton   DOB: 03-10-1966   Age: 58 y.o.   MRN: [id number]   Chief Complaint Chief Complaint  Patient presents with   Follow-up    Elevated blood sugars     History of Present Illness He has diabetes mellitus and brought his home reading and it range from 260 to 400 mg/dl. He HbA1c was 7.0% that is acceptable. I have reviewed his labs with him, his LDL is well controlled. His microalbuminuria was negative.   Past Medical History Past Medical History:  Diagnosis Date   Atrial fibrillation (HCC)    a. Transient during adm 03/2013 for PCI.    CKD (chronic kidney disease) stage 2, GFR 60-89 ml/min    Cluster headache    Coronary artery disease    a. s/p DES x 2 to RCA 03/2013 - residual disease for med rx.   DM2 (diabetes mellitus, type 2) (HCC)    HLD (hyperlipidemia)    HTN (hypertension)    Low testosterone    Nonallopathic lesion of cervical region    OSA (obstructive sleep apnea)    Tobacco abuse    Torticollis, acute      Allergies Allergies  Allergen Reactions   Ranexa [Ranolazine] Nausea Only and Other (See Comments)    dizziness     Review of Systems Review of Systems  Constitutional: Negative.   HENT: Negative.    Respiratory: Negative.    Cardiovascular: Negative.   Gastrointestinal: Negative.   Neurological: Negative.        Objective:    Vitals BP 134/82 (BP Location: Left Arm, Patient Position: Sitting)   Pulse 95   Temp 98.1 F (36.7 C) (Temporal)   Resp 20   Ht 5\' 10"  (1.778 m)   Wt 204 lb 2 oz (92.6 kg)   SpO2 96%   BMI 29.29 kg/m    Physical Examination Physical Exam Constitutional:      Appearance: Normal appearance.  HENT:     Head: Normocephalic and atraumatic.  Cardiovascular:     Rate and Rhythm: Normal rate and regular rhythm.     Heart sounds: Normal heart sounds.  Pulmonary:     Effort: Pulmonary effort is normal.     Breath sounds: Normal breath sounds.  Abdominal:      General: Bowel sounds are normal.     Palpations: Abdomen is soft.  Neurological:     General: No focal deficit present.     Mental Status: He is alert and oriented to person, place, and time.        Assessment & Plan:   Dyslipidemia associated with type 2 diabetes mellitus (HCC) I will do HbA1c today and urine microalbuminuria and then re-evaluate. His LDL is well controlled.   HTN (hypertension) Controlled.   Asthma He takes Singulair 10 mg daily and airsupra as needed    Return in about 3 months (around 10/07/2023).   [name], MD

## 2023-07-10 LAB — HEMOGLOBIN A1C
Est. average glucose Bld gHb Est-mCnc: 263 mg/dL
Hgb A1c MFr Bld: 10.8 % — ABNORMAL HIGH (ref 4.8–5.6)

## 2023-07-10 LAB — CMP14 + ANION GAP
ALT: 18 [IU]/L (ref 0–44)
AST: 13 [IU]/L (ref 0–40)
Albumin: 4.4 g/dL (ref 3.8–4.9)
Alkaline Phosphatase: 103 [IU]/L (ref 44–121)
Anion Gap: 17 mmol/L (ref 10.0–18.0)
BUN/Creatinine Ratio: 16 (ref 9–20)
BUN: 16 mg/dL (ref 6–24)
Bilirubin Total: 0.3 mg/dL (ref 0.0–1.2)
CO2: 21 mmol/L (ref 20–29)
Calcium: 9.3 mg/dL (ref 8.7–10.2)
Chloride: 102 mmol/L (ref 96–106)
Creatinine, Ser: 1 mg/dL (ref 0.76–1.27)
Globulin, Total: 2 g/dL (ref 1.5–4.5)
Glucose: 284 mg/dL — ABNORMAL HIGH (ref 70–99)
Potassium: 4.5 mmol/L (ref 3.5–5.2)
Sodium: 140 mmol/L (ref 134–144)
Total Protein: 6.4 g/dL (ref 6.0–8.5)
eGFR: 88 mL/min/{1.73_m2} (ref >59)

## 2023-07-10 LAB — MICROALBUMIN / CREATININE URINE RATIO
Creatinine, Urine: 75 mg/dL
Microalb/Creat Ratio: 22 mg/g{creat} (ref 0–29)
Microalbumin, Urine: 16.7 ug/mL

## 2023-07-14 ENCOUNTER — Telehealth: Payer: Self-pay | Admitting: Internal Medicine

## 2023-07-14 MED ORDER — MOUNJARO 2.5 MG/0.5ML ~~LOC~~ SOAJ: 2.5000 mg | SUBCUTANEOUS | 3 refills | Status: DC

## 2023-07-14 MED ORDER — METFORMIN HCL ER 500 MG PO TB24: 1000.0000 mg | ORAL_TABLET | Freq: Two times a day (BID) | ORAL | 11 refills | Status: DC

## 2023-07-14 NOTE — Telephone Encounter (Signed)
I have spoken with him that his HbA1c was 10 so I will increasemetformin to 2 tablet twice a day and metformin 2 tablet twice a day

## 2023-07-25 ENCOUNTER — Other Ambulatory Visit: Payer: Self-pay | Admitting: Internal Medicine

## 2023-09-27 ENCOUNTER — Ambulatory Visit: Admitting: Internal Medicine

## 2023-09-27 ENCOUNTER — Encounter: Payer: Self-pay | Admitting: Internal Medicine

## 2023-09-27 VITALS — BP 130/88 | HR 91 | Temp 98.0°F | Resp 18 | Ht 70.0 in | Wt 205.2 lb

## 2023-09-27 DIAGNOSIS — E785 Hyperlipidemia, unspecified: Secondary | ICD-10-CM | POA: Diagnosis not present

## 2023-09-27 DIAGNOSIS — I2510 Atherosclerotic heart disease of native coronary artery without angina pectoris: Secondary | ICD-10-CM | POA: Diagnosis not present

## 2023-09-27 DIAGNOSIS — J4520 Mild intermittent asthma, uncomplicated: Secondary | ICD-10-CM

## 2023-09-27 DIAGNOSIS — I10 Essential (primary) hypertension: Secondary | ICD-10-CM

## 2023-09-27 DIAGNOSIS — E1169 Type 2 diabetes mellitus with other specified complication: Secondary | ICD-10-CM | POA: Diagnosis not present

## 2023-09-27 MED ORDER — AIRSUPRA 90-80 MCG/ACT IN AERO: 2.0000 | INHALATION_SPRAY | Freq: Four times a day (QID) | RESPIRATORY_TRACT | 6 refills | Status: DC | PRN

## 2023-09-27 MED ORDER — TIRZEPATIDE 5 MG/0.5ML ~~LOC~~ SOAJ: 5.0000 mg | SUBCUTANEOUS | 4 refills | Status: DC

## 2023-09-27 MED ORDER — SYNJARDY 12.5-1000 MG PO TABS: 2.0000 | ORAL_TABLET | Freq: Every day | ORAL | 6 refills | Status: DC

## 2023-09-27 NOTE — Assessment & Plan Note (Signed)
 Blood pressure is controlled

## 2023-09-27 NOTE — Progress Notes (Signed)
 Office Visit  Subjective   Patient ID: Matthew Horton   DOB: 01-18-1966   Age: 58 y.o.   MRN: [id number]   Chief Complaint Chief Complaint  Patient presents with   Dyslipidemia     3 month follow up     History of Present Illness 58 years old male who is here for follow up. He has diabetes mellitus. He says his sugar been doing much better. It stay mostly around 90's. He says occasionally his fasting sugar was 150 mg/dl also. His Hb A1c was 10 in January 25. No hypoglycemia. He takes metformin, Mounjaro 2.5 mg daily and glimeperide. His eye examination was at the end of last year.  He has asthma and his symptoms are worse due to pollen. He takes symbicort, Montelukast and airsupra as needed.due to exacerbation due to upper respiratory infection.     He also has hyperlipidemia and takes atorvastatin 40 mg daily.  His lipid panel is well controlled. He denies any side effect from atorvastatin.    He has hypertension and does not check his blood pressure at home.  His blood pressure is controlled.  He has kidney stone and felt his urine is dark color and wonder if stone is acting up. He denies any pain.  He also has bulging cervical disck and he need neck surgery next month and he wanted to know if he is ok to have surgery.    He has a coronary artery disease and he follows with cardiologist once a year.  He denies any exertional dyspnea or shortness of breath.  Past Medical History Past Medical History:  Diagnosis Date   Atrial fibrillation (HCC)    a. Transient during adm 03/2013 for PCI.    CKD (chronic kidney disease) stage 2, GFR 60-89 ml/min    Cluster headache    Coronary artery disease    a. s/p DES x 2 to RCA 03/2013 - residual disease for med rx.   DM2 (diabetes mellitus, type 2) (HCC)    HLD (hyperlipidemia)    HTN (hypertension)    Low testosterone    Nonallopathic lesion of cervical region    OSA (obstructive sleep apnea)    Tobacco abuse    Torticollis,  acute      Allergies Allergies  Allergen Reactions   Ranexa [Ranolazine] Nausea Only and Other (See Comments)    dizziness     Review of Systems Review of Systems  Constitutional: Negative.   HENT: Negative.    Respiratory: Negative.    Cardiovascular: Negative.   Gastrointestinal: Negative.   Neurological: Negative.        Objective:    Vitals BP 130/88 (BP Location: Left Arm, Patient Position: Sitting, Cuff Size: Normal)   Pulse 91   Temp 98 F (36.7 C)   Resp 18   Ht 5\' 10"  (1.778 m)   Wt 205 lb 4 oz (93.1 kg)   SpO2 97%   BMI 29.45 kg/m    Physical Examination Physical Exam Constitutional:      Appearance: Normal appearance.  HENT:     Head: Normocephalic and atraumatic.  Cardiovascular:     Rate and Rhythm: Normal rate and regular rhythm.     Heart sounds: Normal heart sounds.  Pulmonary:     Effort: Pulmonary effort is normal.     Breath sounds: Normal breath sounds.  Abdominal:     General: Bowel sounds are normal.     Palpations: Abdomen is soft.  Neurological:  General: No focal deficit present.     Mental Status: He is alert and oriented to person, place, and time.        Assessment & Plan:   Essential hypertension Blood pressure is controlled  CAD in native artery  Stable and he follow-up with cardiologist once a year  Asthma   He has more congestion during this time of the ER because of the allergic.  He will use a Aerosupra, will continue with the montelukast and Symbicort.  Dyslipidemia associated with type 2 diabetes mellitus (HCC) I will repeat Hb A1c today and lipid panel was done last year.    Return in about 3 months (around 12/27/2023).   Tita Form, MD

## 2023-09-27 NOTE — Assessment & Plan Note (Signed)
 He has more congestion during this time of the ER because of the allergic.  He will use a Aerosupra, will continue with the montelukast and Symbicort.

## 2023-09-27 NOTE — Assessment & Plan Note (Signed)
 I will repeat Hb A1c today and lipid panel was done last year.

## 2023-09-27 NOTE — Assessment & Plan Note (Signed)
 Stable and he follow-up with cardiologist once a year

## 2023-09-28 LAB — HEMOGLOBIN A1C
Est. average glucose Bld gHb Est-mCnc: 146 mg/dL
Hgb A1c MFr Bld: 6.7 % — ABNORMAL HIGH (ref 4.8–5.6)

## 2023-09-28 LAB — CMP14 + ANION GAP
ALT: 15 IU/L (ref 0–44)
AST: 16 IU/L (ref 0–40)
Albumin: 4.4 g/dL (ref 3.8–4.9)
Alkaline Phosphatase: 90 IU/L (ref 44–121)
Anion Gap: 14 mmol/L (ref 10.0–18.0)
BUN/Creatinine Ratio: 16 (ref 9–20)
BUN: 18 mg/dL (ref 6–24)
Bilirubin Total: 0.3 mg/dL (ref 0.0–1.2)
CO2: 21 mmol/L (ref 20–29)
Calcium: 9.7 mg/dL (ref 8.7–10.2)
Chloride: 104 mmol/L (ref 96–106)
Creatinine, Ser: 1.15 mg/dL (ref 0.76–1.27)
Globulin, Total: 1.7 g/dL (ref 1.5–4.5)
Glucose: 144 mg/dL — ABNORMAL HIGH (ref 70–99)
Potassium: 5.1 mmol/L (ref 3.5–5.2)
Sodium: 139 mmol/L (ref 134–144)
Total Protein: 6.1 g/dL (ref 6.0–8.5)
eGFR: 74 mL/min/{1.73_m2} (ref >59)

## 2023-09-29 ENCOUNTER — Ambulatory Visit: Payer: 59 | Admitting: Internal Medicine

## 2023-10-01 ENCOUNTER — Ambulatory Visit: Payer: 59 | Admitting: Internal Medicine

## 2023-10-04 ENCOUNTER — Ambulatory Visit: Admitting: Internal Medicine

## 2023-10-08 ENCOUNTER — Ambulatory Visit: Admitting: Internal Medicine

## 2023-10-08 ENCOUNTER — Encounter: Payer: Self-pay | Admitting: Internal Medicine

## 2023-10-08 VITALS — BP 122/78 | HR 84 | Temp 97.7°F | Resp 18 | Ht 70.0 in | Wt 196.2 lb

## 2023-10-08 DIAGNOSIS — N289 Disorder of kidney and ureter, unspecified: Secondary | ICD-10-CM | POA: Diagnosis not present

## 2023-10-08 DIAGNOSIS — E1169 Type 2 diabetes mellitus with other specified complication: Secondary | ICD-10-CM

## 2023-10-08 DIAGNOSIS — N200 Calculus of kidney: Secondary | ICD-10-CM | POA: Insufficient documentation

## 2023-10-08 DIAGNOSIS — E785 Hyperlipidemia, unspecified: Secondary | ICD-10-CM

## 2023-10-08 NOTE — Assessment & Plan Note (Signed)
 He has follow-up appointment with urologist next week for kidney stone.  I have discussed with him that we will repeat renal function on next visit.

## 2023-10-08 NOTE — Assessment & Plan Note (Signed)
 His hemoglobin A1c was 6.5 and I will stop glimepiride  to avoid hypoglycemia.

## 2023-10-08 NOTE — Progress Notes (Signed)
 Office Visit  Subjective   Patient ID: Matthew Horton   DOB: 04/28/1966   Age: 58 y.o.   MRN: [id number]   Chief Complaint Chief Complaint  Patient presents with   ER Follow up     History of Present Illness 58 years old male who is here for follow-up from emergency room.  He has a history of kidney stones in the past and he went to the emergency room on Saturday with the noted leukocytes in his urine with some blood.  Stent was placed on Saturday morning by urologist.  He was advised to have follow-up with urologist as outpatient that he is going to see them next week.  His GFR was also low.  His GFR last year was 95, in January of this year it was 72 but in the emergency room GFR was 41.  He says that he is making good urine now.  No pain and no other complaint. I have also discussed with him that his hemoglobin A1c was 6.5 he was taking 2 mg glimepiride  and sometimes he feel like that he does not want to eat so I have suggested to stop glimepiride  to avoid hypoglycemia.  Past Medical History Past Medical History:  Diagnosis Date   Atrial fibrillation (HCC)    a. Transient during adm 03/2013 for PCI.    CKD (chronic kidney disease) stage 2, GFR 60-89 ml/min    Cluster headache    Coronary artery disease    a. s/p DES x 2 to RCA 03/2013 - residual disease for med rx.   DM2 (diabetes mellitus, type 2) (HCC)    HLD (hyperlipidemia)    HTN (hypertension)    Low testosterone    Nonallopathic lesion of cervical region    OSA (obstructive sleep apnea)    Tobacco abuse    Torticollis, acute      Allergies Allergies  Allergen Reactions   Ranexa  [Ranolazine ] Nausea Only and Other (See Comments)    dizziness     Review of Systems Review of Systems  Constitutional: Negative.   HENT: Negative.    Respiratory: Negative.    Cardiovascular: Negative.   Gastrointestinal: Negative.   Neurological: Negative.        Objective:    Vitals BP 122/78 (BP Location: Left Arm,  Patient Position: Sitting, Cuff Size: Normal)   Pulse 84   Temp 97.7 F (36.5 C)   Resp 18   Ht 5\' 10"  (1.778 m)   Wt 196 lb 4 oz (89 kg)   SpO2 99%   BMI 28.16 kg/m    Physical Examination Physical Exam Constitutional:      Appearance: Normal appearance.  Cardiovascular:     Rate and Rhythm: Normal rate and regular rhythm.     Heart sounds: Normal heart sounds.  Abdominal:     General: Bowel sounds are normal.     Palpations: Abdomen is soft.  Neurological:     General: No focal deficit present.     Mental Status: He is alert and oriented to person, place, and time.        Assessment & Plan:   Kidney stone He has follow-up appointment with urologist next week for kidney stone.  I have discussed with him that we will repeat renal function on next visit.  Dyslipidemia associated with type 2 diabetes mellitus (HCC) His hemoglobin A1c was 6.5 and I will stop glimepiride  to avoid hypoglycemia.  Acute renal disease His GFR dropped from 88 in January  to 41 in the emergency room.  This was probably because of kidney stone and nausea feeling.  He will drink plenty of water and we will repeat renal function on next visit.    No follow-ups on file.   Tita Form, MD

## 2023-10-08 NOTE — Assessment & Plan Note (Signed)
 His GFR dropped from 88 in January to 41 in the emergency room.  This was probably because of kidney stone and nausea feeling.  He will drink plenty of water and we will repeat renal function on next visit.

## 2023-10-10 ENCOUNTER — Other Ambulatory Visit: Payer: Self-pay | Admitting: Internal Medicine

## 2023-11-15 ENCOUNTER — Other Ambulatory Visit: Payer: Self-pay

## 2023-11-15 MED ORDER — TIRZEPATIDE 5 MG/0.5ML ~~LOC~~ SOAJ: 2.5000 mg | SUBCUTANEOUS | 4 refills | Status: DC

## 2023-11-15 MED ORDER — MOUNJARO 2.5 MG/0.5ML ~~LOC~~ SOAJ: 2.5000 mg | SUBCUTANEOUS | 0 refills | Status: DC

## 2023-11-15 NOTE — Progress Notes (Signed)
 Rx Dose Change

## 2023-12-03 ENCOUNTER — Other Ambulatory Visit: Payer: Self-pay | Admitting: Internal Medicine

## 2023-12-13 ENCOUNTER — Other Ambulatory Visit: Payer: Self-pay | Admitting: Internal Medicine

## 2023-12-16 ENCOUNTER — Other Ambulatory Visit: Payer: Self-pay | Admitting: Internal Medicine

## 2023-12-27 ENCOUNTER — Encounter: Payer: Self-pay | Admitting: Internal Medicine

## 2023-12-27 ENCOUNTER — Ambulatory Visit: Admitting: Internal Medicine

## 2023-12-27 VITALS — BP 124/70 | HR 95 | Temp 97.7°F | Resp 18 | Ht 70.0 in | Wt 186.0 lb

## 2023-12-27 DIAGNOSIS — E1169 Type 2 diabetes mellitus with other specified complication: Secondary | ICD-10-CM | POA: Diagnosis not present

## 2023-12-27 DIAGNOSIS — I10 Essential (primary) hypertension: Secondary | ICD-10-CM | POA: Diagnosis not present

## 2023-12-27 DIAGNOSIS — I2510 Atherosclerotic heart disease of native coronary artery without angina pectoris: Secondary | ICD-10-CM | POA: Diagnosis not present

## 2023-12-27 DIAGNOSIS — E785 Hyperlipidemia, unspecified: Secondary | ICD-10-CM | POA: Diagnosis not present

## 2023-12-27 DIAGNOSIS — G8929 Other chronic pain: Secondary | ICD-10-CM

## 2023-12-27 DIAGNOSIS — M5441 Lumbago with sciatica, right side: Secondary | ICD-10-CM

## 2023-12-27 NOTE — Assessment & Plan Note (Signed)
 He will follow-up with neurosurgeon.

## 2023-12-27 NOTE — Assessment & Plan Note (Signed)
His blood pressure is controlled.

## 2023-12-27 NOTE — Progress Notes (Signed)
 Office Visit  Subjective   Patient ID: Matthew Horton   DOB: Jan 24, 1966   Age: 58 y.o.   MRN: [id number]   Chief Complaint Chief Complaint  Patient presents with   Nephrolithiasis    3 month follow up     History of Present Illness 58 years old male who is here for follow-up. He  Denies having any back pain from kidney stone.  He will follow-up with urologist..He has neck surgery and his neck pain resolved.  He still has pain between his shoulder blade but per is neurosurgeon but this will go away in few weeks time.  He was given steroid few times so he wanted to hold blood draw this time.    He has type 2 diabetes mellitus, he take Mounjaro  2.5 mg weekly and Synjardy  2 tablets daily. He was given steroid because of back pain and he wanted to hold repeat blood draw because he is worried that his sugar might be high.  His last hemoglobin A1c was 6.5 in April this year.   No hypoglycemia.  He has hyperlipidemia he take atorvastatin  40 mg daily.    He has hypertension and he takes metoprolol  50 mg twice a day and amlodipine  5 mg daily.  He also has a coronary artery disease and no more chest pain.    Past Medical History Past Medical History:  Diagnosis Date   Atrial fibrillation (HCC)    a. Transient during adm 03/2013 for PCI.    CKD (chronic kidney disease) stage 2, GFR 60-89 ml/min    Cluster headache    Coronary artery disease    a. s/p DES x 2 to RCA 03/2013 - residual disease for med rx.   DM2 (diabetes mellitus, type 2) (HCC)    HLD (hyperlipidemia)    HTN (hypertension)    Low testosterone    Nonallopathic lesion of cervical region    OSA (obstructive sleep apnea)    Tobacco abuse    Torticollis, acute      Allergies Allergies  Allergen Reactions   Ranexa  [Ranolazine ] Nausea Only and Other (See Comments)    dizziness     Review of Systems Review of Systems  Constitutional: Negative.   HENT: Negative.    Respiratory: Negative.    Cardiovascular: Negative.    Gastrointestinal: Negative.   Musculoskeletal:  Positive for back pain.  Neurological: Negative.        Objective:    Vitals BP 124/70   Pulse 95   Temp 97.7 F (36.5 C)   Resp 18   Ht 5' 10 (1.778 m)   Wt 186 lb (84.4 kg)   PF 95 L/min   BMI 26.69 kg/m    Physical Examination Physical Exam Constitutional:      Appearance: Normal appearance.  HENT:     Head: Normocephalic and atraumatic.  Cardiovascular:     Rate and Rhythm: Normal rate and regular rhythm.  Pulmonary:     Effort: Pulmonary effort is normal.     Breath sounds: Normal breath sounds.  Abdominal:     General: Bowel sounds are normal.     Palpations: Abdomen is soft.  Neurological:     Mental Status: He is alert.        Assessment & Plan:   Essential hypertension   His blood pressure is controlled.  CAD in native artery   Stable  Dyslipidemia associated with type 2 diabetes mellitus (HCC)  He will continue to monitor his blood sugar  intake medication.  I will repeat labs on next visit.  Back pain   He will follow-up with neurosurgeon.    Return in about 2 months (around 02/27/2024).   Roetta Dare, MD

## 2023-12-27 NOTE — Assessment & Plan Note (Signed)
 He will continue to monitor his blood sugar intake medication.  I will repeat labs on next visit.

## 2023-12-27 NOTE — Assessment & Plan Note (Signed)
 Stable

## 2024-01-13 ENCOUNTER — Other Ambulatory Visit: Payer: Self-pay | Admitting: Internal Medicine

## 2024-01-14 ENCOUNTER — Other Ambulatory Visit: Payer: Self-pay

## 2024-01-14 MED ORDER — SYNJARDY 12.5-1000 MG PO TABS: 2.0000 | ORAL_TABLET | Freq: Every day | ORAL | 6 refills | Status: DC

## 2024-01-14 MED ORDER — MOUNJARO 2.5 MG/0.5ML ~~LOC~~ SOAJ: 2.5000 mg | SUBCUTANEOUS | 0 refills | Status: AC

## 2024-01-14 MED ORDER — AIRSUPRA 90-80 MCG/ACT IN AERO: 2.0000 | INHALATION_SPRAY | Freq: Four times a day (QID) | RESPIRATORY_TRACT | 6 refills | Status: AC | PRN

## 2024-01-14 MED ORDER — BUDESONIDE-FORMOTEROL FUMARATE 160-4.5 MCG/ACT IN AERO: 2.0000 | INHALATION_SPRAY | Freq: Two times a day (BID) | RESPIRATORY_TRACT | 12 refills | Status: AC

## 2024-02-28 ENCOUNTER — Ambulatory Visit: Admitting: Internal Medicine

## 2024-03-03 ENCOUNTER — Telehealth: Payer: Self-pay | Admitting: Internal Medicine

## 2024-03-03 MED ORDER — AMLODIPINE BESYLATE 5 MG PO TABS: ORAL_TABLET | ORAL | 0 refills | Status: DC

## 2024-03-03 NOTE — Telephone Encounter (Signed)
 Pt's medication was sent to pt's pharmacy as requested. Confirmation received.

## 2024-03-03 NOTE — Telephone Encounter (Signed)
 *  STAT* If patient is at the pharmacy, call can be transferred to refill team.   1. Which medications need to be refilled? (please list name of each medication and dose if known) amLODipine  (NORVASC ) 5 MG tablet    2. Would you like to learn more about the convenience, safety, & potential cost savings by using the [facility]? No      3. Are you open to using the [facility] (Type [facility]. ). No    4. Which pharmacy/location (including street and city if local pharmacy) is medication to be sent to? [id number] - [place], [place] - [address]    5. Do they need a 30 day or 90 day supply? 90 days    Patient has been out of medications

## 2024-03-06 ENCOUNTER — Ambulatory Visit: Admitting: Internal Medicine

## 2024-03-06 ENCOUNTER — Encounter: Payer: Self-pay | Admitting: Internal Medicine

## 2024-03-06 VITALS — BP 124/74 | HR 94 | Temp 97.8°F | Resp 18 | Ht 70.0 in | Wt 186.0 lb

## 2024-03-06 DIAGNOSIS — N200 Calculus of kidney: Secondary | ICD-10-CM

## 2024-03-06 DIAGNOSIS — E785 Hyperlipidemia, unspecified: Secondary | ICD-10-CM | POA: Diagnosis not present

## 2024-03-06 DIAGNOSIS — J4520 Mild intermittent asthma, uncomplicated: Secondary | ICD-10-CM

## 2024-03-06 DIAGNOSIS — E1169 Type 2 diabetes mellitus with other specified complication: Secondary | ICD-10-CM

## 2024-03-06 DIAGNOSIS — I10 Essential (primary) hypertension: Secondary | ICD-10-CM

## 2024-03-06 NOTE — Assessment & Plan Note (Signed)
 His blood pressure is controlled.

## 2024-03-06 NOTE — Assessment & Plan Note (Signed)
 His asthma symptoms are stable

## 2024-03-06 NOTE — Progress Notes (Signed)
 Office Visit  Subjective   Patient ID: Matthew Horton   DOB: 15-Apr-1966   Age: 58 y.o.   MRN: [id number]   Chief Complaint Chief Complaint  Patient presents with   Follow-up    2 month follow up     History of Present Illness 58 years old male who is here for follow-up. He says that his sugar stay below 140 mg/dl. He see eye doctor every year and is due now. His last Hb A1c was 5.6 in 09/2023. His urine was negative for microalbuminuria.  No hypoglycemia, he takes Mounjaro  2.5 mg daily and syngardy 2 tablets daily.   He reports that he does get often diarrhea and was asking if this could be related with his medications.  He has renal stones and he follows with urologist.  He has hyperlipidemia and he takes atorvastatin  40 mg daily. His last last lipid panel was 09/2023.      He has hypertension and he takes metoprolol  50 mg twice a day and amlodipine  5 mg daily.  He also has a coronary artery disease and no more chest pain.    He has a history of irregular heartbeat during procedure.  He do not have any further episodes of irregular heartbeat but  he was advised to have sleep study done.  His wife reports that he has no or and and jerk at nighttime.  He says that he has a appointment with social Services for disability and he will have sleep study once things settle down.  He has not gone to work for more than a year.  He do has kidney stone and back pain as well.  His back pain comes and goes.    He has history of asthma and he take inhaler.    Past Medical History Past Medical History:  Diagnosis Date   Atrial fibrillation (HCC)    a. Transient during adm 03/2013 for PCI.    CKD (chronic kidney disease) stage 2, GFR 60-89 ml/min    Cluster headache    Coronary artery disease    a. s/p DES x 2 to RCA 03/2013 - residual disease for med rx.   DM2 (diabetes mellitus, type 2) (HCC)    HLD (hyperlipidemia)    HTN (hypertension)    Low testosterone    Nonallopathic lesion of  cervical region    OSA (obstructive sleep apnea)    Tobacco abuse    Torticollis, acute      Allergies Allergies  Allergen Reactions   Ranexa  [Ranolazine ] Nausea Only and Other (See Comments)    dizziness     Review of Systems Review of Systems  Constitutional: Negative.   HENT: Negative.    Respiratory: Negative.    Cardiovascular: Negative.   Gastrointestinal:  Positive for diarrhea.  Neurological: Negative.        Objective:    Vitals BP 124/74   Pulse 94   Temp 97.8 F (36.6 C)   Resp 18   Ht 5' 10 (1.778 m)   Wt 186 lb (84.4 kg)   SpO2 98%   BMI 26.69 kg/m    Physical Examination Physical Exam Constitutional:      Appearance: Normal appearance.  HENT:     Head: Normocephalic and atraumatic.  Eyes:     Extraocular Movements: Extraocular movements intact.     Pupils: Pupils are equal, round, and reactive to light.  Cardiovascular:     Rate and Rhythm: Normal rate and regular rhythm.  Heart sounds: Normal heart sounds.  Pulmonary:     Effort: Pulmonary effort is normal.     Breath sounds: Normal breath sounds.  Abdominal:     General: Bowel sounds are normal.     Palpations: Abdomen is soft.  Neurological:     General: No focal deficit present.     Mental Status: He is alert and oriented to person, place, and time.        Assessment & Plan:   Essential hypertension   His blood pressure is controlled.  Asthma   His asthma symptoms are stable  Dyslipidemia associated with type 2 diabetes mellitus (HCC)  I have suggested to decrease the dose of Synjardy  to 1 tablets daily and continue with Mounjaro  2.5 mg once a week.  I will do hemoglobin A1c, lipid panel today.  His urine was negative for microalbuminuria.  He will see eye doctor for diabetic eye exam.  Kidney stone   No more kidney stone.    He says that he does not get flu shot.  Return in about 3 months (around 06/05/2024).   Roetta Dare, MD

## 2024-03-06 NOTE — Assessment & Plan Note (Signed)
 I have suggested to decrease the dose of Synjardy  to 1 tablets daily and continue with Mounjaro  2.5 mg once a week.  I will do hemoglobin A1c, lipid panel today.  His urine was negative for microalbuminuria.  He will see eye doctor for diabetic eye exam.

## 2024-03-06 NOTE — Assessment & Plan Note (Signed)
 No more kidney stone.

## 2024-03-07 LAB — CMP14 + ANION GAP
ALT: 14 IU/L (ref 0–44)
AST: 11 IU/L (ref 0–40)
Albumin: 4.1 g/dL (ref 3.8–4.9)
Alkaline Phosphatase: 88 IU/L (ref 47–123)
Anion Gap: 13 mmol/L (ref 10.0–18.0)
BUN/Creatinine Ratio: 19 (ref 9–20)
BUN: 20 mg/dL (ref 6–24)
Bilirubin Total: 0.3 mg/dL (ref 0.0–1.2)
CO2: 20 mmol/L (ref 20–29)
Calcium: 9.4 mg/dL (ref 8.7–10.2)
Chloride: 103 mmol/L (ref 96–106)
Creatinine, Ser: 1.07 mg/dL (ref 0.76–1.27)
Globulin, Total: 2.2 g/dL (ref 1.5–4.5)
Glucose: 108 mg/dL — ABNORMAL HIGH (ref 70–99)
Potassium: 4.6 mmol/L (ref 3.5–5.2)
Sodium: 136 mmol/L (ref 134–144)
Total Protein: 6.3 g/dL (ref 6.0–8.5)
eGFR: 80 mL/min/1.73

## 2024-03-07 LAB — LIPID PANEL
Chol/HDL Ratio: 3.8 ratio (ref 0.0–5.0)
Cholesterol, Total: 114 mg/dL (ref 100–199)
HDL: 30 mg/dL — ABNORMAL LOW
LDL Chol Calc (NIH): 70 mg/dL (ref 0–99)
Triglycerides: 65 mg/dL (ref 0–149)
VLDL Cholesterol Cal: 14 mg/dL (ref 5–40)

## 2024-03-07 LAB — HEMOGLOBIN A1C
Est. average glucose Bld gHb Est-mCnc: 137 mg/dL
Hgb A1c MFr Bld: 6.4 % — ABNORMAL HIGH (ref 4.8–5.6)

## 2024-03-08 ENCOUNTER — Ambulatory Visit: Payer: Self-pay

## 2024-03-08 NOTE — Progress Notes (Signed)
 Patient called.  Patient aware. His labs are good.

## 2024-03-11 ENCOUNTER — Other Ambulatory Visit: Payer: Self-pay | Admitting: Internal Medicine

## 2024-03-13 ENCOUNTER — Telehealth: Payer: Self-pay | Admitting: Internal Medicine

## 2024-03-13 MED ORDER — ATORVASTATIN CALCIUM 40 MG PO TABS: 40.0000 mg | ORAL_TABLET | Freq: Every day | ORAL | 0 refills | Status: DC

## 2024-03-13 NOTE — Telephone Encounter (Signed)
*  STAT* If patient is at the pharmacy, call can be transferred to refill team.   1. Which medications need to be refilled? (please list name of each medication and dose if known) atorvastatin  (LIPITOR ) 40 MG tablet   TAKE 1 TABLET BY MOUTH DAILY AT 6 PM.  2. Would you like to learn more about the convenience, safety, & potential cost savings by using the [facility]? No    3. Are you open to using the [facility] No   4. Which pharmacy/location (including street and city if local pharmacy) is medication to be sent to? [id number] - [place], [place] - [address]    5. Do they need a 30 day or 90 day supply? 90 Day Supply   Pt has been out of medication for 2 days

## 2024-03-13 NOTE — Telephone Encounter (Signed)
 Pt's medication was sent to pt's pharmacy as requested. Confirmation received.

## 2024-04-27 ENCOUNTER — Ambulatory Visit: Attending: Internal Medicine | Admitting: Internal Medicine

## 2024-04-27 ENCOUNTER — Encounter: Payer: Self-pay | Admitting: Internal Medicine

## 2024-04-27 VITALS — BP 94/60 | HR 86 | Ht 70.0 in | Wt 187.0 lb

## 2024-04-27 DIAGNOSIS — E119 Type 2 diabetes mellitus without complications: Secondary | ICD-10-CM | POA: Diagnosis not present

## 2024-04-27 DIAGNOSIS — E785 Hyperlipidemia, unspecified: Secondary | ICD-10-CM

## 2024-04-27 DIAGNOSIS — I10 Essential (primary) hypertension: Secondary | ICD-10-CM

## 2024-04-27 DIAGNOSIS — I2510 Atherosclerotic heart disease of native coronary artery without angina pectoris: Secondary | ICD-10-CM | POA: Diagnosis not present

## 2024-04-27 MED ORDER — AMLODIPINE BESYLATE 2.5 MG PO TABS: 2.5000 mg | ORAL_TABLET | Freq: Every day | ORAL | 3 refills | Status: AC

## 2024-04-27 MED ORDER — NITROGLYCERIN 0.4 MG SL SUBL: 0.4000 mg | SUBLINGUAL_TABLET | SUBLINGUAL | 2 refills | Status: AC | PRN

## 2024-04-27 NOTE — Patient Instructions (Signed)
 Medication Instructions:   Start taking Amlodipine  2.5 mg daily    *If you need a refill on your cardiac medications before your next appointment, please call your pharmacy*   Lab Work: Not needed If you have labs (blood work) drawn today and your tests are completely normal, you will receive your results only by: MyChart Message (if you have MyChart) OR A paper copy in the mail If you have any lab test that is abnormal or we need to change your treatment, we will call you to review the results.   Testing/Procedures:  Not needed  Follow-Up: At [facility], you and your health needs are our priority.  As part of our continuing mission to provide you with exceptional heart care, we have created designated Provider Care Teams.  These Care Teams include your primary Cardiologist (physician) and Advanced Practice Providers (APPs -  Physician Assistants and Nurse Practitioners) who all work together to provide you with the care you need, when you need it.     Your next appointment:   12 month(s)  The format for your next appointment:   In Person  Provider:   Vinie JAYSON Maxcy, MD

## 2024-04-27 NOTE — Progress Notes (Signed)
 OFFICE NOTE  Chief Complaint:  Follow-up  Primary Care Physician: Amin, Saad, MD  HPI:  Matthew Horton is a 58 y.o. male with PMH of HTN, DM-2, HLD, OSA & long-standing tobacco abuse with known moderate coronary disease by catheterization in March of 2014 for an abnormal stress test. At that time he shown to have moderate two-vessel disease in the diagonal branch of the LAD as well as the RCA. There was also an ostial OM1 lesion. An FFR of the RCA 70% lesion wasn't not significant. Medical therapy was recommended, The patient continued to have intermittent episodes of chest discomfort since that time. Despite being on aggressive therapy, he presented again on the morning of March 21, 2013 with substernal chest pain like a brick sitting on his chest. This was relieved by nitroglycerin . His EKG demonstrated no acute changes. He was admitted and ruled out for MI with negative troponins x 3. However, considering his history and high likelihood for progression of disease, a relook cath was recommended. This was performed by Dr. Anner, via the right radial artery. He was found to have severe 2 site CAD in the mid RCA and mid PDA, demonstrating significant progression of disease since March of 2014. Both lesions were treated with DES. He had normal LVEF and EDP. He tolerated the procedure well but was noted to have post PCI atrial fibrillation with RVR. He left the cath lab in stable condition. He had no further A-fib on telemetry. He remained CP free. The right radial access site remained stable. His labs were stable. He did have mild hypertension and was started on a low dose ACE-I. He was resumed on his BB and statin. He was placed on DAPT with ASA and Brilinta .  He returns today in follow-up and is feeling well. His radial cath site has a good pulse and there is good distal sensation and perfusion. He has had no further chest pain and no significant DOE.  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  He reports that he has stopped smoking with this recent hospitalization.  Matthew Horton was recently hospitalized again for chest pain, he underwent repeat cardiac catheterization which showed the folllowing:  Normal LV function   Two-vessel cardiac disease with previously noted 7080% stenosis in the diagonal branch of the LAD the very small superior branch of this diagonal vessel with 95% ostial stenosis and evidence for 30% proximal LAD narrowing as well as 80% mid LAD intramyocardial segment with mid systolic bridging which did improve following IC nitroglycerin  administration; 30% narrowing in the distal circumflex marginal vessel; and widely patent mid and distal RCA stents with distal inferior LV branch diffuse stenoses/spasm and a small distal vessel   Previously, the patient has been demonstrated to have diagonal stenoses with normal FFR. The present diagonal stenosis appears 80% but visually is the same as previously. He also has evidence for mid systolic bridging of the mid LAD and spasm/diffuse stenoses of an inferior distal branch of his RCA. The previously placed RCA stents are widely patent. The patient will be treated with more aggressive medical regimen including nitrates as well as amlodipine . Smoking cessation is imperative. Consideration for nuclear imaging to assess for anterolateral ischemia may be helpful if recurrent symptomatology develops.  He followed up in our office postcardiac catheterization and reported some persistent sharp electric quick chest pains in the upper midepigastric area. He was started on low-dose amlodipine  for possible  coronary spasm and he reported initially his symptoms improved significantly however recently they picked up again. His EKG shows no acute changes. He has not taken nitroglycerin  because it makes him feel bad.   I saw Matthew Horton back in the office today. He reports in March of this year he went  to see another cardiologist, Dr. Alvis at [facility], for a second opinion regarding persistent chest discomfort. Dr. Alvis felt that this was atypical for coronary artery disease and ordered a treadmill stress test. This was abnormal which led to a stress echocardiogram. This also was interpreted as mildly abnormal and ultimately he had a repeat cardiac catheterization. Per Mr. Dicarlo's report this was negative for any new obstructive coronary disease. He was placed then on omeprazole and his symptoms improved almost immediately. I've surged on care everywhere for the cath report however cannot seem to find it. We will request those records from [facility].  03/06/2016  Matthew Horton returns today for follow-up. In January he saw Glendia Ferrier, PA-C, who had evaluated him for persistent chest discomfort. The symptoms are somewhat atypical and more consistent with reflux. However he was restarted on Protonix  and given isosorbide . He said he took the medication but felt funny in the head, and then it was discontinued. He reports being compliant with pen Toprol  resolved. He still gets episodes chest discomfort. He reports his blood sugars are fairly well controlled with an A1c of 6.1 that is unchanged despite more than 20 pound weight loss. He is currently at 200 pounds with a BMI of 28. Blood pressures on the low normal side at 106/70. EKG today shows sinus rhythm with no ischemia.  03/11/2017  Matthew Horton was seen today in follow-up. He had had no issues over the past year. He denies chest pain, dyspnea or any palpitations. He says he has some pain in his right neck - it feels like a pulsation. Not worse when turning his neck - it is sharp and comes and goes. BP appears controlled today. He has not had a recent lipid profile in our system, but had an LDL-C of 105 in 2015. His weight is up some again and we discussed the importance of weight loss. 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 He says he  will need a stress test to accomplish this.  03/10/2018  Matthew Horton was seen today in follow-up.  Is been a year since I last saw him.  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  He underwent stress testing last year which was negative for ischemia.  He says he will need repeat testing every other year.  He has had minimal weight loss.  His diet is less than optimal and higher in saturated fats.  Recent LDL cholesterol in May was 91 with goal LDL less than 70.  He also reports this continued globus sensation in the neck.  Carotid Dopplers were negative.  He has some upper abdominal tightness which he feels like it is in his chest, however he points to his upper abdomen.  He says this happens mostly in the mornings and may represent reflux.  He was previously on Protonix  however due to the cost of the medicine he stopped taking it.  He said it was the only medicine that work for him.  03/30/2019  Matthew Horton returns today for follow-up.  He reports some intermittent chest discomfort.  He says it feels like sharp pains that occur at rest or could  be with exertion.  He is not managed to lose much weight.  He is try to cut back his eating but not change the quality of his foods.  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  He also has intermittent reflux symptoms and reports compliance with Protonix .  Blood pressure was elevated today however he did not take his medicines this morning.  03/29/2020  Matthew Horton returns for routine follow-up.  He says he has been off this week as his wife had cataract surgery.  He continues to require DOT driving license.  He had stress testing last year which was negative for ischemia but showed a mildly reduced LVEF of 48%.  A repeat echo then showed low normal LVEF of 50 to 55%.  He gets occasional shortness of breath with some wheezing.  He says it gets better with an inhaler and is requesting a refill of  his albuterol .  I suspect he might have some COPD however he says it is asthma.  He was a smoker.  I encouraged him to follow-up with his PCP to get pulmonary function testing.  Blood pressure is normal today.  He denies any chest pain.  EKG shows normal sinus rhythm.  His last LDL was 108 in 2020.  He did say last year that his hemoglobin A1c went above 10 but then subsequently he worked to lower his blood sugar with weight loss and had come down about 20 pounds.  A1c as of April of this year was 6.6.  03/17/2023  Matthew Horton is seen today for follow-up.  He had recent back surgery from an anterior approach about 2 weeks ago.  He seems to be healing well and I reviewed his wounds today at his request.  They are granulating well.  No evidence of any discharge.  His blood pressure is well-controlled.  Denies any chest pain or shortness of breath.  He is lipids are well-controlled with LDL of 58.  He had last had PCI around 2014.  He has subsequently been on dual antiplatelet therapy since then.  04/27/2024  Matthew Horton returns today for follow-up.  Unfortunately he has had numerous back surgeries and now is considered disabled.  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  He denies any coronary chest pain.  Does report some occasional discomfort in the anterior chest wall which is worse with palpation.  He was wearing a device around his neck which apparently is a spinal stimulator to help bone growth in his neck, however interestingly this is an external device.  Blood pressure was low today 94/60.  He has been on Mounjaro  in addition to his medications to help with his blood sugar.  He has had a noted 20 pound weight loss with that.  A1c is 6.4%.  Recent cholesterol in September showed total 114, HDL 30, triglycerides 65 and LDL 70.  PMHx:  Past Medical History:  Diagnosis Date   Atrial fibrillation (HCC)    a. Transient during adm 03/2013 for PCI.    CKD  (chronic kidney disease) stage 2, GFR 60-89 ml/min    Cluster headache    Coronary artery disease    a. s/p DES x 2 to RCA 03/2013 - residual disease for med rx.   DM2 (diabetes mellitus, type 2) (HCC)    HLD (hyperlipidemia)    HTN (hypertension)    Low testosterone  Nonallopathic lesion of cervical region    OSA (obstructive sleep apnea)    Tobacco abuse    Torticollis, acute     Past Surgical History:  Procedure Laterality Date   CARDIAC CATHETERIZATION  08/08/2013   CORONARY ANGIOPLASTY WITH STENT PLACEMENT  03/22/2013   2 stents, Dr. CHARM Clay   FRACTIONAL FLOW RESERVE WIRE Right 03/22/2013   Procedure: FRACTIONAL FLOW RESERVE WIRE;  Surgeon: Alm LELON Clay, MD;  Location: [facility] CATH LAB;  Service: Cardiovascular;  Laterality: Right;   LEFT HEART CATHETERIZATION WITH CORONARY ANGIOGRAM N/A 08/16/2012   Procedure: LEFT HEART CATHETERIZATION WITH CORONARY ANGIOGRAM;  Surgeon: Dorn JINNY Lesches, MD;  Location: [facility] CATH LAB;  Service: Cardiovascular;  Laterality: N/A;   LEFT HEART CATHETERIZATION WITH CORONARY ANGIOGRAM N/A 03/22/2013   Procedure: LEFT HEART CATHETERIZATION WITH CORONARY ANGIOGRAM;  Surgeon: Alm LELON Clay, MD;  Location: [facility] CATH LAB;  Service: Cardiovascular;  Laterality: N/A;   LEFT HEART CATHETERIZATION WITH CORONARY ANGIOGRAM N/A 08/08/2013   Procedure: LEFT HEART CATHETERIZATION WITH CORONARY ANGIOGRAM;  Surgeon: Debby DELENA Sor, MD;  Location: [facility] CATH LAB;  Service: Cardiovascular;  Laterality: N/A;   PERCUTANEOUS CORONARY STENT INTERVENTION (PCI-S) Right 03/22/2013   Procedure: PERCUTANEOUS CORONARY STENT INTERVENTION (PCI-S);  Surgeon: Alm LELON Clay, MD;  Location: [facility] CATH LAB;  Service: Cardiovascular;  Laterality: Right;   TONSILLECTOMY     TRAUMATIC AMPUTATION AND REATTACHMENT Left    Index finger    FAMHx:  Family History  Problem Relation Age of Onset   Coronary artery disease Father 65       MI and s/p CABG   Diabetes Father    Hypertension Father     Hyperlipidemia Father    Hyperlipidemia Mother     SOCHx:   reports that he quit smoking about 10 years ago. His smoking use included cigarettes. He started smoking about 40 years ago. He has a 30 pack-year smoking history. He has never used smokeless tobacco. He reports that he does not drink alcohol and does not use drugs.  ALLERGIES:  Allergies  Allergen Reactions   Ranexa  [Ranolazine ] Nausea Only and Other (See Comments)    dizziness    ROS: Pertinent items noted in HPI and remainder of comprehensive ROS otherwise negative.  HOME MEDS: Current Outpatient Medications  Medication Sig Dispense Refill   acetaminophen  (TYLENOL ) 325 MG tablet Take 1-2 tablets (325-650 mg total) by mouth every 4 (four) hours as needed for mild pain or moderate pain.     Albuterol -Budesonide  (AIRSUPRA ) 90-80 MCG/ACT AERO Inhale 2 puffs into the lungs every 6 (six) hours as needed. 1 g 6   amLODipine  (NORVASC ) 5 MG tablet TAKE 1 TABLET DAILY. 90 tablet 0   aspirin  EC 81 MG tablet Take 81 mg by mouth every morning.      atorvastatin  (LIPITOR ) 40 MG tablet Take 1 tablet (40 mg total) by mouth daily at 6 PM. 90 tablet 0   budesonide -formoterol  (SYMBICORT ) 160-4.5 MCG/ACT inhaler Inhale 2 puffs into the lungs in the morning and at bedtime. 1 each 12   Empagliflozin-metFORMIN  HCl (SYNJARDY ) 12.10-998 MG TABS Take 2 tablets by mouth daily. 60 tablet 6   ezetimibe  (ZETIA ) 10 MG tablet TAKE 1 TABLET BY MOUTH EVERY DAY 90 tablet 3   metoprolol  tartrate (LOPRESSOR ) 50 MG tablet TAKE 1 TABLET BY MOUTH TWICE A DAY 180 tablet 0   montelukast  (SINGULAIR ) 10 MG tablet TAKE 1 TABLET BY MOUTH EVERY DAY 90 tablet 1   nitroGLYCERIN  (NITROSTAT ) 0.4 MG  SL tablet Place 1 tablet (0.4 mg total) under the tongue every 5 (five) minutes as needed for chest pain. 25 tablet 2   pantoprazole  (PROTONIX ) 40 MG tablet TAKE 1 TABLET BY MOUTH EVERY DAY 90 tablet 3   SILDENAFIL CITRATE PO Take 30 mg by mouth as needed.     tirzepatide   (MOUNJARO ) 2.5 MG/0.5ML Pen Inject 2.5 mg into the skin once a week. 2 mL 0   fluticasone  (FLONASE ) 50 MCG/ACT nasal spray Place 1 spray into both nostrils 3 (three) times daily as needed for allergies or rhinitis. (Patient not taking: Reported on 04/27/2024) 15.8 mL 1   guaiFENesin  (MUCINEX ) 600 MG 12 hr tablet Take 1 tablet (600 mg total) by mouth 2 (two) times daily. (Patient not taking: Reported on 04/27/2024) 30 tablet 0   guaiFENesin -codeine  100-10 MG/5ML syrup Take 10 mLs by mouth 3 (three) times daily as needed. (Patient not taking: Reported on 04/27/2024) 240 mL 0   methocarbamol  (ROBAXIN ) 500 MG tablet Take 500 mg by mouth 2 (two) times daily. (Patient not taking: Reported on 12/27/2023)     No current facility-administered medications for this visit.    LABS/IMAGING: No results found for this or any previous visit (from the past 48 hours). No results found.  VITALS: BP 94/60   Pulse 86   Ht 5' 10 (1.778 m)   Wt 187 lb (84.8 kg)   SpO2 97%   BMI 26.83 kg/m   EXAM: General appearance: alert and no distress Neck: no carotid bruit, no JVD and thyroid not enlarged, symmetric, no tenderness/mass/nodules Lungs: clear to auscultation bilaterally Heart: regular rate and rhythm, S1, S2 normal, no murmur, click, rub or gallop Abdomen: soft, non-tender; bowel sounds normal; no masses,  no organomegaly Extremities: extremities normal, atraumatic, no cyanosis or edema Pulses: 2+ and symmetric Skin: Skin color, texture, turgor normal. No rashes or lesions Neurologic: Grossly normal Psych: Pleasant  EKG: EKG Interpretation Date/Time:  Thursday April 27 2024 10:31:03 EST Ventricular Rate:  86 PR Interval:  138 QRS Duration:  90 QT Interval:  380 QTC Calculation: 454 R Axis:   61  Text Interpretation: Normal sinus rhythm Normal ECG When compared with ECG of 28-Dec-2022 14:58, No significant change was found Confirmed by Mona Kent [id number]) on 04/27/2024 10:50:23 AM     ASSESSMENT: CAD s/p PCI to the mid-RCA and distal PDA with DES (03/2013) -  patent stents by cardiac catheterization at [facility] in 08/2014 HTN Hyperlipidemia, goal LDL less than 70 Former smoker with ?asthma, suspect COPD GERD  DM2- A1c 6.4% Numerous back surgeries no disabled  PLAN: 1.   Matthew Horton is doing well without any cardiac sounding chest pain.  He does get some atypical chest wall pain anteriorly which may be neuropathic related to his back surgeries.  He does have a external cervical spine stimulator in place today.  His cholesterol is at target.  His blood pressure is actually quite low today.  He says it generally runs in the low 100s recently after weight loss and I suspect he needs medication adjustment.  I would advise decreasing his amlodipine  to 2.5 mg daily.  Follow-up with me annually or sooner as necessary.  Kent KYM Mona, MD, [facility], FNLA, FACP  [place]  [facility]  Medical Director of the [facility]  Card[facility] Reduction Clinic Diplomate of the American Board of Clinical Lipidology Attending Cardiologist  Direct Dial: [phone]  Fax: [phone]  Website:  [email or web address]   Kent JAYSON Mona  04/27/2024, 10:50 AM

## 2024-05-17 ENCOUNTER — Other Ambulatory Visit: Payer: Self-pay | Admitting: Internal Medicine

## 2024-05-17 MED ORDER — SYNJARDY 12.5-1000 MG PO TABS: 2.0000 | ORAL_TABLET | Freq: Every day | ORAL | 6 refills | Status: DC

## 2024-05-31 ENCOUNTER — Encounter: Payer: Self-pay | Admitting: Internal Medicine

## 2024-05-31 ENCOUNTER — Ambulatory Visit: Admitting: Internal Medicine

## 2024-05-31 VITALS — BP 124/80 | HR 93 | Temp 97.3°F | Resp 18 | Ht 70.0 in | Wt 189.4 lb

## 2024-05-31 DIAGNOSIS — E785 Hyperlipidemia, unspecified: Secondary | ICD-10-CM | POA: Diagnosis not present

## 2024-05-31 DIAGNOSIS — I2510 Atherosclerotic heart disease of native coronary artery without angina pectoris: Secondary | ICD-10-CM

## 2024-05-31 DIAGNOSIS — E1169 Type 2 diabetes mellitus with other specified complication: Secondary | ICD-10-CM | POA: Diagnosis not present

## 2024-05-31 DIAGNOSIS — I10 Essential (primary) hypertension: Secondary | ICD-10-CM | POA: Diagnosis not present

## 2024-05-31 MED ORDER — SYNJARDY 12.5-1000 MG PO TABS: 2.0000 | ORAL_TABLET | Freq: Every day | ORAL | 4 refills | Status: AC

## 2024-05-31 NOTE — Progress Notes (Signed)
 Office Visit  Subjective   Patient ID: Matthew Horton   DOB: 1965/11/02   Age: 58 y.o.   MRN: [id number]   Chief Complaint Chief Complaint  Patient presents with   Follow-up    3 month    Hypertension    3 month follow up     History of Present Illness 58 years old male who is here for follow-up. He says that his sugar stay below 140 mg/dl. He see eye doctor every year and is due now. His last Hb A1c was 5.6 in 09/2023. His urine was negative for microalbuminuria.  No hypoglycemia, he takes Mounjaro  2.5 mg daily and syngardy 2 tablets daily.   He reports that he does get often diarrhea and was asking if this could be related with his medications.   He has renal stones and he follows with urologist.   He has hyperlipidemia and he takes atorvastatin  40 mg daily. His last last lipid panel was 09/2023.      He has hypertension and he takes metoprolol  50 mg twice a day and amlodipine  5 mg daily.  He also has a coronary artery disease and no more chest pain.     He has a history of irregular heartbeat during procedure.  He do not have any further episodes of irregular heartbeat but  he was advised to have sleep study done.  His wife reports that he has no or and and jerk at nighttime.  He says that he has a appointment with social Services for disability and he will have sleep study once things settle down.  He has not gone to work for more than a year.  He do has kidney stone and back pain as well.  His back pain comes and goes.     He has history of asthma and he take inhaler.  Past Medical History Past Medical History:  Diagnosis Date   Atrial fibrillation (HCC)    a. Transient during adm 03/2013 for PCI.    CKD (chronic kidney disease) stage 2, GFR 60-89 ml/min    Cluster headache    Coronary artery disease    a. s/p DES x 2 to RCA 03/2013 - residual disease for med rx.   DM2 (diabetes mellitus, type 2) (HCC)    HLD (hyperlipidemia)    HTN (hypertension)    Low testosterone     Nonallopathic lesion of cervical region    OSA (obstructive sleep apnea)    Tobacco abuse    Torticollis, acute      Allergies Allergies[1]   Review of Systems Review of Systems  Constitutional: Negative.   HENT: Negative.    Respiratory: Negative.    Cardiovascular: Negative.   Gastrointestinal: Negative.   Neurological: Negative.        Objective:    Vitals BP 124/80   Pulse 93   Temp (!) 97.3 F (36.3 C)   Resp 18   Ht 5' 10 (1.778 m)   Wt 189 lb 6 oz (85.9 kg)   SpO2 98%   BMI 27.17 kg/m    Physical Examination Physical Exam Constitutional:      Appearance: Normal appearance.  HENT:     Head: Normocephalic and atraumatic.  Eyes:     Extraocular Movements: Extraocular movements intact.     Pupils: Pupils are equal, round, and reactive to light.  Cardiovascular:     Rate and Rhythm: Normal rate and regular rhythm.     Heart sounds: Normal heart sounds.  Pulmonary:     Effort: Pulmonary effort is normal.     Breath sounds: Normal breath sounds.  Abdominal:     General: Bowel sounds are normal.     Palpations: Abdomen is soft.  Neurological:     General: No focal deficit present.     Mental Status: He is alert and oriented to person, place, and time.        Assessment & Plan:   No problem-specific Assessment & Plan notes found for this encounter.    Return in about 3 months (around 08/29/2024) for For PE on next visit.   Roetta Dare, MD      [1]  Allergies Allergen Reactions   Ranexa  [Ranolazine ] Nausea Only and Other (See Comments)    dizziness

## 2024-06-09 ENCOUNTER — Other Ambulatory Visit: Payer: Self-pay | Admitting: Internal Medicine

## 2024-06-10 ENCOUNTER — Other Ambulatory Visit: Payer: Self-pay | Admitting: Internal Medicine

## 2024-08-31 ENCOUNTER — Encounter: Admitting: Internal Medicine
